# Patient Record
Sex: Female | Born: 1935 | Race: White | Hispanic: No | Marital: Single | Smoking: Never smoker
Health system: Southern US, Community
[De-identification: ages and names within clinical notes are randomized; demographics above are authoritative.]

## PROBLEM LIST (undated history)

## (undated) DIAGNOSIS — K219 Gastro-esophageal reflux disease without esophagitis: Secondary | ICD-10-CM

## (undated) DIAGNOSIS — M1990 Unspecified osteoarthritis, unspecified site: Secondary | ICD-10-CM

## (undated) DIAGNOSIS — I10 Essential (primary) hypertension: Secondary | ICD-10-CM

## (undated) HISTORY — PX: CHOLECYSTECTOMY: SHX55

## (undated) HISTORY — PX: BREAST SURGERY: SHX581

## (undated) HISTORY — PX: ABDOMINAL HYSTERECTOMY: SHX81

---

## 2001-12-25 ENCOUNTER — Ambulatory Visit (HOSPITAL_COMMUNITY): Admission: RE | Admit: 2001-12-25 | Discharge: 2001-12-25 | Payer: Self-pay | Admitting: Internal Medicine

## 2013-07-22 ENCOUNTER — Encounter (HOSPITAL_COMMUNITY): Payer: Self-pay | Admitting: Pharmacy Technician

## 2013-07-27 NOTE — Patient Instructions (Signed)
Your procedure is scheduled on: 08/03/2013  Report to [facility] at  96 AM.  Call this number if you have problems the morning of surgery: [phone]   Do not eat food or drink liquids :After Midnight.      Take these medicines the morning of surgery with A SIP OF WATER: lisinopril, protonix   Do not wear jewelry, make-up or nail polish.  Do not wear lotions, powders, or perfumes.   Do not shave 48 hours prior to surgery.  Do not bring valuables to the hospital.  Contacts, dentures or bridgework may not be worn into surgery.  Leave suitcase in the car. After surgery it may be brought to your room.  For patients admitted to the hospital, checkout time is 11:00 AM the day of discharge.   Patients discharged the day of surgery will not be allowed to drive home.  :     Please read over the following fact sheets that you were given: Coughing and Deep Breathing, Surgical Site Infection Prevention, Anesthesia Post-op Instructions and Care and Recovery After Surgery    Cataract A cataract is a clouding of the lens of the eye. When a lens becomes cloudy, vision is reduced based on the degree and nature of the clouding. Many cataracts reduce vision to some degree. Some cataracts make people more near-sighted as they develop. Other cataracts increase glare. Cataracts that are ignored and become worse can sometimes look white. The white color can be seen through the pupil. CAUSES   Aging. However, cataracts may occur at any age, even in newborns.   Certain drugs.   Trauma to the eye.   Certain diseases such as diabetes.   Specific eye diseases such as chronic inflammation inside the eye or a sudden attack of a rare form of glaucoma.   Inherited or acquired medical problems.  SYMPTOMS   Gradual, progressive drop in vision in the affected eye.   Severe, rapid visual loss. This most often happens when trauma is the cause.  DIAGNOSIS  To detect a cataract, an eye doctor examines the lens.  Cataracts are best diagnosed with an exam of the eyes with the pupils enlarged (dilated) by drops.  TREATMENT  For an early cataract, vision may improve by using different eyeglasses or stronger lighting. If that does not help your vision, surgery is the only effective treatment. A cataract needs to be surgically removed when vision loss interferes with your everyday activities, such as driving, reading, or watching TV. A cataract may also have to be removed if it prevents examination or treatment of another eye problem. Surgery removes the cloudy lens and usually replaces it with a substitute lens (intraocular lens, IOL).  At a time when both you and your doctor agree, the cataract will be surgically removed. If you have cataracts in both eyes, only one is usually removed at a time. This allows the operated eye to heal and be out of danger from any possible problems after surgery (such as infection or poor wound healing). In rare cases, a cataract may be doing damage to your eye. In these cases, your caregiver may advise surgical removal right away. The vast majority of people who have cataract surgery have better vision afterward. HOME CARE INSTRUCTIONS  If you are not planning surgery, you may be asked to do the following:  Use different eyeglasses.   Use stronger or brighter lighting.   Ask your eye doctor about reducing your medicine dose or changing medicines if  it is thought that a medicine caused your cataract. Changing medicines does not make the cataract go away on its own.   Become familiar with your surroundings. Poor vision can lead to injury. Avoid bumping into things on the affected side. You are at a higher risk for tripping or falling.   Exercise extreme care when driving or operating machinery.   Wear sunglasses if you are sensitive to bright light or experiencing problems with glare.  SEEK IMMEDIATE MEDICAL CARE IF:   You have a worsening or sudden vision loss.   You notice  redness, swelling, or increasing pain in the eye.   You have a fever.  Document Released: 03/26/2005 Document Revised: 03/15/2011 Document Reviewed: 11/17/2010 [facility] Patient Information 2012 [place].PATIENT INSTRUCTIONS POST-ANESTHESIA  IMMEDIATELY FOLLOWING SURGERY:  Do not drive or operate machinery for the first twenty four hours after surgery.  Do not make any important decisions for twenty four hours after surgery or while taking narcotic pain medications or sedatives.  If you develop intractable nausea and vomiting or a severe headache please notify your doctor immediately.  FOLLOW-UP:  Please make an appointment with your surgeon as instructed. You do not need to follow up with anesthesia unless specifically instructed to do so.  WOUND CARE INSTRUCTIONS (if applicable):  Keep a dry clean dressing on the anesthesia/puncture wound site if there is drainage.  Once the wound has quit draining you may leave it open to air.  Generally you should leave the bandage intact for twenty four hours unless there is drainage.  If the epidural site drains for more than 36-48 hours please call the anesthesia department.  QUESTIONS?:  Please feel free to call your physician or the hospital operator if you have any questions, and they will be happy to assist you.

## 2013-07-28 ENCOUNTER — Encounter (HOSPITAL_COMMUNITY)
Admission: RE | Admit: 2013-07-28 | Discharge: 2013-07-28 | Disposition: A | Discharge status: Home / Self Care | Payer: Medicare Other | Source: Ambulatory Visit | Attending: Ophthalmology | Admitting: Ophthalmology

## 2013-07-28 ENCOUNTER — Encounter (HOSPITAL_COMMUNITY): Payer: Self-pay

## 2013-07-28 ENCOUNTER — Other Ambulatory Visit: Payer: Self-pay

## 2013-07-28 DIAGNOSIS — Z01812 Encounter for preprocedural laboratory examination: Secondary | ICD-10-CM | POA: Insufficient documentation

## 2013-07-28 DIAGNOSIS — Z01810 Encounter for preprocedural cardiovascular examination: Secondary | ICD-10-CM | POA: Insufficient documentation

## 2013-07-28 HISTORY — DX: Essential (primary) hypertension: I10

## 2013-07-28 HISTORY — DX: Unspecified osteoarthritis, unspecified site: M19.90

## 2013-07-28 HISTORY — DX: Gastro-esophageal reflux disease without esophagitis: K21.9

## 2013-07-28 LAB — BASIC METABOLIC PANEL
BUN: 24 mg/dL — AB (ref 6–23)
CHLORIDE: 99 meq/L (ref 96–112)
CO2: 29 mEq/L (ref 19–32)
Calcium: 9.8 mg/dL (ref 8.4–10.5)
Creatinine, Ser: 0.93 mg/dL (ref 0.50–1.10)
GFR, EST AFRICAN AMERICAN: 65 mL/min — AB (ref >90)
GFR, EST NON AFRICAN AMERICAN: 56 mL/min — AB (ref >90)
Glucose, Bld: 105 mg/dL — ABNORMAL HIGH (ref 70–99)
POTASSIUM: 5.2 meq/L (ref 3.7–5.3)
SODIUM: 139 meq/L (ref 137–147)

## 2013-07-28 LAB — HEMOGLOBIN AND HEMATOCRIT, BLOOD
HCT: 42 % (ref 36.0–46.0)
HEMOGLOBIN: 14.3 g/dL (ref 12.0–15.0)

## 2013-07-28 NOTE — Pre-Procedure Instructions (Signed)
Patient given information to sign up for my chart at home. 

## 2013-07-31 MED ORDER — NEOMYCIN-POLYMYXIN-DEXAMETH 3.5-10000-0.1 OP SUSP
OPHTHALMIC | Status: AC
  Filled 2013-07-31: qty 5

## 2013-07-31 MED ORDER — PHENYLEPHRINE HCL 2.5 % OP SOLN
OPHTHALMIC | Status: AC
  Filled 2013-07-31: qty 15

## 2013-07-31 MED ORDER — LIDOCAINE HCL 3.5 % OP GEL
OPHTHALMIC | Status: AC
  Filled 2013-07-31: qty 1

## 2013-07-31 MED ORDER — TETRACAINE HCL 0.5 % OP SOLN
OPHTHALMIC | Status: AC
  Filled 2013-07-31: qty 2

## 2013-07-31 MED ORDER — CYCLOPENTOLATE-PHENYLEPHRINE OP SOLN OPTIME - NO CHARGE
OPHTHALMIC | Status: AC
  Filled 2013-07-31: qty 2

## 2013-07-31 MED ORDER — LIDOCAINE HCL (PF) 1 % IJ SOLN
INTRAMUSCULAR | Status: AC
  Filled 2013-07-31: qty 2

## 2013-08-03 ENCOUNTER — Ambulatory Visit (HOSPITAL_COMMUNITY)
Admission: RE | Admit: 2013-08-03 | Discharge: 2013-08-03 | Disposition: A | Discharge status: Home / Self Care | Payer: Medicare Other | Source: Ambulatory Visit | Attending: Ophthalmology | Admitting: Ophthalmology

## 2013-08-03 ENCOUNTER — Encounter (HOSPITAL_COMMUNITY)
Admission: RE | Disposition: A | Discharge status: Home / Self Care | Payer: Self-pay | Source: Ambulatory Visit | Attending: Ophthalmology

## 2013-08-03 ENCOUNTER — Encounter (HOSPITAL_COMMUNITY): Payer: Medicare Other | Admitting: Anesthesiology

## 2013-08-03 ENCOUNTER — Encounter (HOSPITAL_COMMUNITY): Payer: Self-pay | Admitting: *Deleted

## 2013-08-03 ENCOUNTER — Ambulatory Visit (HOSPITAL_COMMUNITY): Payer: Medicare Other | Admitting: Anesthesiology

## 2013-08-03 DIAGNOSIS — H2589 Other age-related cataract: Secondary | ICD-10-CM | POA: Insufficient documentation

## 2013-08-03 DIAGNOSIS — Z79899 Other long term (current) drug therapy: Secondary | ICD-10-CM | POA: Insufficient documentation

## 2013-08-03 DIAGNOSIS — I10 Essential (primary) hypertension: Secondary | ICD-10-CM | POA: Insufficient documentation

## 2013-08-03 DIAGNOSIS — K219 Gastro-esophageal reflux disease without esophagitis: Secondary | ICD-10-CM | POA: Insufficient documentation

## 2013-08-03 HISTORY — PX: CATARACT EXTRACTION W/PHACO: SHX586

## 2013-08-03 SURGERY — PHACOEMULSIFICATION, CATARACT, WITH IOL INSERTION
Anesthesia: Monitor Anesthesia Care | Site: Eye | Laterality: Right

## 2013-08-03 MED ORDER — MIDAZOLAM HCL 2 MG/2ML IJ SOLN
INTRAMUSCULAR | Status: AC
  Filled 2013-08-03: qty 2

## 2013-08-03 MED ORDER — PROVISC 10 MG/ML IO SOLN
INTRAOCULAR | Status: DC | PRN
  Administered 2013-08-03: 0.85 mL via INTRAOCULAR

## 2013-08-03 MED ORDER — LACTATED RINGERS IV SOLN
INTRAVENOUS | Status: DC
  Administered 2013-08-03: 07:00:00 via INTRAVENOUS

## 2013-08-03 MED ORDER — LIDOCAINE HCL (PF) 1 % IJ SOLN
INTRAMUSCULAR | Status: DC | PRN
  Administered 2013-08-03: .5 mL

## 2013-08-03 MED ORDER — EPINEPHRINE HCL 1 MG/ML IJ SOLN
INTRAOCULAR | Status: DC | PRN
  Administered 2013-08-03: 08:00:00

## 2013-08-03 MED ORDER — POVIDONE-IODINE 5 % OP SOLN
OPHTHALMIC | Status: DC | PRN
  Administered 2013-08-03: 1 via OPHTHALMIC

## 2013-08-03 MED ORDER — FENTANYL CITRATE 0.05 MG/ML IJ SOLN
25.0000 ug | INTRAMUSCULAR | Status: AC
  Administered 2013-08-03 (×2): 25 ug via INTRAVENOUS

## 2013-08-03 MED ORDER — PHENYLEPHRINE HCL 2.5 % OP SOLN
1.0000 [drp] | OPHTHALMIC | Status: AC
  Administered 2013-08-03 (×3): 1 [drp] via OPHTHALMIC

## 2013-08-03 MED ORDER — NEOMYCIN-POLYMYXIN-DEXAMETH 3.5-10000-0.1 OP SUSP
OPHTHALMIC | Status: DC | PRN
Start: 2013-08-03 — End: 2013-08-03
  Administered 2013-08-03: 2 [drp] via OPHTHALMIC

## 2013-08-03 MED ORDER — FENTANYL CITRATE 0.05 MG/ML IJ SOLN: 25.0000 ug | INTRAMUSCULAR | Status: DC | PRN

## 2013-08-03 MED ORDER — LIDOCAINE HCL 3.5 % OP GEL
1.0000 "application " | Freq: Once | OPHTHALMIC | Status: AC
  Administered 2013-08-03: 1 via OPHTHALMIC

## 2013-08-03 MED ORDER — LIDOCAINE 3.5 % OP GEL OPTIME - NO CHARGE
OPHTHALMIC | Status: DC | PRN
  Administered 2013-08-03: 2 [drp] via OPHTHALMIC

## 2013-08-03 MED ORDER — MIDAZOLAM HCL 2 MG/2ML IJ SOLN
1.0000 mg | INTRAMUSCULAR | Status: DC | PRN
  Administered 2013-08-03: 2 mg via INTRAVENOUS

## 2013-08-03 MED ORDER — FENTANYL CITRATE 0.05 MG/ML IJ SOLN
INTRAMUSCULAR | Status: AC
  Filled 2013-08-03: qty 2

## 2013-08-03 MED ORDER — CYCLOPENTOLATE-PHENYLEPHRINE 0.2-1 % OP SOLN
1.0000 [drp] | OPHTHALMIC | Status: AC
  Administered 2013-08-03 (×3): 1 [drp] via OPHTHALMIC

## 2013-08-03 MED ORDER — ONDANSETRON HCL 4 MG/2ML IJ SOLN: 4.0000 mg | Freq: Once | INTRAMUSCULAR | Status: DC | PRN

## 2013-08-03 MED ORDER — BSS IO SOLN
INTRAOCULAR | Status: DC | PRN
Start: 2013-08-03 — End: 2013-08-03
  Administered 2013-08-03: 15 mL via INTRAOCULAR

## 2013-08-03 MED ORDER — TETRACAINE HCL 0.5 % OP SOLN
1.0000 [drp] | OPHTHALMIC | Status: AC
  Administered 2013-08-03 (×3): 1 [drp] via OPHTHALMIC

## 2013-08-03 SURGICAL SUPPLY — 33 items
CAPSULAR TENSION RING-AMO (OPHTHALMIC RELATED) IMPLANT
CLOTH BEACON ORANGE TIMEOUT ST (SAFETY) ×1 IMPLANT
EYE SHIELD UNIVERSAL CLEAR (GAUZE/BANDAGES/DRESSINGS) ×1 IMPLANT
GLOVE BIO SURGEON STRL SZ 6.5 (GLOVE) IMPLANT
GLOVE BIOGEL PI IND STRL 6.5 (GLOVE) IMPLANT
GLOVE BIOGEL PI IND STRL 7.0 (GLOVE) IMPLANT
GLOVE BIOGEL PI IND STRL 7.5 (GLOVE) IMPLANT
GLOVE BIOGEL PI INDICATOR 6.5 (GLOVE) ×1
GLOVE BIOGEL PI INDICATOR 7.0 (GLOVE)
GLOVE BIOGEL PI INDICATOR 7.5 (GLOVE)
GLOVE ECLIPSE 6.5 STRL STRAW (GLOVE) IMPLANT
GLOVE ECLIPSE 7.0 STRL STRAW (GLOVE) IMPLANT
GLOVE ECLIPSE 7.5 STRL STRAW (GLOVE) IMPLANT
GLOVE EXAM NITRILE LRG STRL (GLOVE) IMPLANT
GLOVE EXAM NITRILE MD LF STRL (GLOVE) IMPLANT
GLOVE SKINSENSE NS SZ6.5 (GLOVE)
GLOVE SKINSENSE NS SZ7.0 (GLOVE)
GLOVE SKINSENSE STRL SZ6.5 (GLOVE) IMPLANT
GLOVE SKINSENSE STRL SZ7.0 (GLOVE) IMPLANT
GLOVE SS N UNI LF 7.0 STRL (GLOVE) ×1 IMPLANT
KIT VITRECTOMY (OPHTHALMIC RELATED) IMPLANT
PAD ARMBOARD 7.5X6 YLW CONV (MISCELLANEOUS) ×1 IMPLANT
PROC W NO LENS (INTRAOCULAR LENS)
PROC W SPEC LENS (INTRAOCULAR LENS)
PROCESS W NO LENS (INTRAOCULAR LENS) IMPLANT
PROCESS W SPEC LENS (INTRAOCULAR LENS) IMPLANT
RING MALYGIN (MISCELLANEOUS) IMPLANT
SIGHTPATH CAT PROC W REG LENS (Ophthalmic Related) ×2 IMPLANT
SYR TB 1ML LL NO SAFETY (SYRINGE) ×1 IMPLANT
TAPE SURG TRANSPORE 1 IN (GAUZE/BANDAGES/DRESSINGS) IMPLANT
TAPE SURGICAL TRANSPORE 1 IN (GAUZE/BANDAGES/DRESSINGS) ×1
VISCOELASTIC ADDITIONAL (OPHTHALMIC RELATED) IMPLANT
WATER STERILE IRR 250ML POUR (IV SOLUTION) ×1 IMPLANT

## 2013-08-03 NOTE — Transfer of Care (Signed)
Immediate Anesthesia Transfer of Care Note  Patient: Paula Mason  Procedure(s) Performed: Procedure(s) (LRB): CATARACT EXTRACTION PHACO AND INTRAOCULAR LENS PLACEMENT (IOC) (Right)  Patient Location: Shortstay  Anesthesia Type: MAC  Level of Consciousness: awake  Airway & Oxygen Therapy: Patient Spontanous Breathing   Post-op Assessment: Report given to PACU RN, Post -op Vital signs reviewed and stable and Patient moving all extremities  Post vital signs: Reviewed and stable  Complications: No apparent anesthesia complications

## 2013-08-03 NOTE — H&P (Signed)
I have reviewed the H&P, the patient was re-examined, and I have identified no interval changes in medical condition and plan of care since the history and physical of record  

## 2013-08-03 NOTE — Anesthesia Procedure Notes (Signed)
Procedure Name: MAC Date/Time: 08/03/2013 7:52 AM Performed by: Vista Deck Pre-anesthesia Checklist: Patient identified, Emergency Drugs available, Suction available, Timeout performed and Patient being monitored Patient Re-evaluated:Patient Re-evaluated prior to inductionOxygen Delivery Method: Nasal Cannula

## 2013-08-03 NOTE — Anesthesia Postprocedure Evaluation (Signed)
  Anesthesia Post-op Note  Patient: Paula Mason  Procedure(s) Performed: Procedure(s) (LRB): CATARACT EXTRACTION PHACO AND INTRAOCULAR LENS PLACEMENT (IOC) (Right)  Patient Location:  Short Stay  Anesthesia Type: MAC  Level of Consciousness: awake  Airway and Oxygen Therapy: Patient Spontanous Breathing  Post-op Pain: none  Post-op Assessment: Post-op Vital signs reviewed, Patient's Cardiovascular Status Stable, Respiratory Function Stable, Patent Airway, No signs of Nausea or vomiting and Pain level controlled  Post-op Vital Signs: Reviewed and stable  Complications: No apparent anesthesia complications

## 2013-08-03 NOTE — Anesthesia Preprocedure Evaluation (Addendum)
Anesthesia Evaluation  Patient identified by MRN, date of birth, ID band Patient awake    Reviewed: Allergy & Precautions, H&P , NPO status , Patient's Chart, lab work & pertinent test results  Airway Mallampati: II TM Distance: >3 FB     Dental  (+) Teeth Intact   Pulmonary neg pulmonary ROS,  breath sounds clear to auscultation        Cardiovascular hypertension, Pt. on medications Rhythm:Regular Rate:Normal     Neuro/Psych    GI/Hepatic GERD-  Medicated and Controlled,  Endo/Other    Renal/GU      Musculoskeletal   Abdominal   Peds  Hematology   Anesthesia Other Findings   Reproductive/Obstetrics                          Anesthesia Physical Anesthesia Plan  ASA: II  Anesthesia Plan: MAC   Post-op Pain Management:    Induction: Intravenous  Airway Management Planned: Nasal Cannula  Additional Equipment:   Intra-op Plan:   Post-operative Plan:   Informed Consent: I have reviewed the patients History and Physical, chart, labs and discussed the procedure including the risks, benefits and alternatives for the proposed anesthesia with the patient or authorized representative who has indicated his/her understanding and acceptance.     Plan Discussed with:   Anesthesia Plan Comments:         Anesthesia Quick Evaluation

## 2013-08-03 NOTE — Op Note (Signed)
Date of Admission: 08/03/2013  Date of Surgery: 08/03/2013   Pre-Op Dx: Cataract Right Eye  Post-Op Dx: Combined Cataract Right  Eye,  Dx Code 366.19  Surgeon: Tonny Branch, M.D.  Assistants: None  Anesthesia: Topical with MAC  Indications: Painless, progressive loss of vision with compromise of daily activities.  Surgery: Cataract Extraction with Intraocular lens Implant Right Eye  Discription: The patient had dilating drops and viscous lidocaine placed into the Right eye in the pre-op holding area. After transfer to the operating room, a time out was performed. The patient was then prepped and draped. Beginning with a 52 degree blade a paracentesis port was made at the surgeon's 2 o'clock position. The anterior chamber was then filled with 1% non-preserved lidocaine. This was followed by filling the anterior chamber with Provisc.  A 2.54mm keratome blade was used to make a clear corneal incision at the temporal limbus.  A bent cystatome needle was used to create a continuous tear capsulotomy. Hydrodissection was performed with balanced salt solution on a Fine canula. The lens nucleus was then removed using the phacoemulsification handpiece. Residual cortex was removed with the I&A handpiece. The anterior chamber and capsular bag were refilled with Provisc. A posterior chamber intraocular lens was placed into the capsular bag with it's injector. The implant was positioned with the Kuglan hook. The Provisc was then removed from the anterior chamber and capsular bag with the I&A handpiece. Stromal hydration of the main incision and paracentesis port was performed with BSS on a Fine canula. The wounds were tested for leak which was negative. The patient tolerated the procedure well. There were no operative complications. The patient was then transferred to the recovery room in stable condition.  Complications: None  Specimen: None  EBL: None  Prosthetic device: Hoya iSert 250, power 15.5 D, SN  [id number].

## 2013-08-03 NOTE — Discharge Instructions (Signed)

## 2013-08-04 ENCOUNTER — Encounter (HOSPITAL_COMMUNITY): Payer: Self-pay | Admitting: Ophthalmology

## 2015-01-18 ENCOUNTER — Ambulatory Visit (INDEPENDENT_AMBULATORY_CARE_PROVIDER_SITE_OTHER): Payer: Medicare Other | Admitting: Podiatry

## 2015-01-18 ENCOUNTER — Encounter: Payer: Self-pay | Admitting: Podiatry

## 2015-01-18 VITALS — BP 154/89 | HR 73 | Resp 16

## 2015-01-18 DIAGNOSIS — L603 Nail dystrophy: Secondary | ICD-10-CM | POA: Diagnosis not present

## 2015-01-18 NOTE — Addendum Note (Signed)
Addended by: Clovis Riley E on: 01/18/2015 01:47 PM   Modules accepted: Orders

## 2015-01-18 NOTE — Progress Notes (Signed)
   Subjective:    Patient ID: Paula Mason, female    DOB: [birth year], 81 y.o.   MRN: [id number]  HPI: She presents today with a chief complaint of thick yellow dystrophic nails hallux and lesser digits of the left foot. She also complains of peeling in her forefoot. She states that she's been to see many different doctors including podiatrist in dermatologists who have provided her with oral medications topical medications latest of which is clobetasol which seems to work the best on the plantar aspect of the foot. But nothing ever helped with the nails. She denies psoriasis or eczema and trauma. She states that she has this on no other nails including her hands or her other foot.   Review of Systems  HENT: Positive for hearing loss.   Hematological: Bruises/bleeds easily.  All other systems reviewed and are negative.      Objective:   Physical Exam: 81 year old white female in no apparent distress vital signs stable alert and oriented 3 pulses are strongly palpable. Neurologic sensorium is intact per Semmes-Weinstein monofilament. Deep tendon reflexes are intact bilateral and muscle strength +5 over 5 dorsiflexion plantar flexors and inverters everters all intrinsic musculature is intact. Orthopedic evaluation demonstrates all joints distal to the ankle L4 range of motion without crepitation. Cutaneous evaluation demonstrates supple well-hydrated cutis hallux second third fourth nails of the left foot are yellowed thickened friable and easily breakable. Fifth digit appears to be normal. Plantar aspect of the scan does demonstrate some mild peeling of the skin but no rashes.        Assessment & Plan:  Assessment: Nail dystrophy cannot rule out onychomycosis eczema psoriatic nail.  Plan: Samples were taken today of toenails #1 #2 #3 and #4 of the left foot. They were sent for pathologic evaluation and I will follow-up with this patient once we have our final result.  Roselind Messier DPM

## 2015-01-18 NOTE — Addendum Note (Signed)
Addended by: Harriett Sine D on: 01/18/2015 02:12 PM   Modules accepted: Orders

## 2015-01-19 ENCOUNTER — Telehealth: Payer: Self-pay | Admitting: *Deleted

## 2015-01-19 LAB — KOH PREP

## 2015-01-19 NOTE — Telephone Encounter (Addendum)
-----   Message from Garrel Ridgel, [place] sent at 01/19/2015  4:42 PM EDT ----- Fungus. Have her in.  Informed pt of results and will have scheduler contact her 01/20/2015.

## 2015-01-24 ENCOUNTER — Telehealth: Payer: Self-pay | Admitting: *Deleted

## 2015-01-24 NOTE — Telephone Encounter (Signed)
Dr. Milinda Pointer states pt needs an appt to discuss fungal culture results and test.  Left message informing pt to set up an appt.

## 2015-01-25 ENCOUNTER — Ambulatory Visit (INDEPENDENT_AMBULATORY_CARE_PROVIDER_SITE_OTHER): Payer: Medicare Other | Admitting: Podiatry

## 2015-01-25 ENCOUNTER — Encounter: Payer: Self-pay | Admitting: Podiatry

## 2015-01-25 VITALS — BP 107/61 | HR 93 | Resp 16

## 2015-01-25 DIAGNOSIS — Z79899 Other long term (current) drug therapy: Secondary | ICD-10-CM

## 2015-01-25 DIAGNOSIS — L603 Nail dystrophy: Secondary | ICD-10-CM | POA: Diagnosis not present

## 2015-01-25 LAB — CBC WITH DIFFERENTIAL/PLATELET
BASOS ABS: 0 10*3/uL (ref 0.0–0.1)
BASOS PCT: 0 % (ref 0–1)
EOS ABS: 0.1 10*3/uL (ref 0.0–0.7)
Eosinophils Relative: 1 % (ref 0–5)
HCT: 38.3 % (ref 36.0–46.0)
HEMOGLOBIN: 13.1 g/dL (ref 12.0–15.0)
Lymphocytes Relative: 34 % (ref 12–46)
Lymphs Abs: 2.8 10*3/uL (ref 0.7–4.0)
MCH: 32.3 pg (ref 26.0–34.0)
MCHC: 34.2 g/dL (ref 30.0–36.0)
MCV: 94.3 fL (ref 78.0–100.0)
MPV: 9.3 fL (ref 8.6–12.4)
Monocytes Absolute: 0.7 10*3/uL (ref 0.1–1.0)
Monocytes Relative: 9 % (ref 3–12)
NEUTROS ABS: 4.5 10*3/uL (ref 1.7–7.7)
NEUTROS PCT: 56 % (ref 43–77)
Platelets: 289 10*3/uL (ref 150–400)
RBC: 4.06 MIL/uL (ref 3.87–5.11)
RDW: 14.7 % (ref 11.5–15.5)
WBC: 8.1 10*3/uL (ref 4.0–10.5)

## 2015-01-25 LAB — HEPATIC FUNCTION PANEL
ALBUMIN: 4 g/dL (ref 3.6–5.1)
ALT: 27 U/L (ref 6–29)
AST: 29 U/L (ref 10–35)
Alkaline Phosphatase: 57 U/L (ref 33–130)
BILIRUBIN TOTAL: 0.4 mg/dL (ref 0.2–1.2)
Bilirubin, Direct: 0.1 mg/dL (ref <=0.2)
Indirect Bilirubin: 0.3 mg/dL (ref 0.2–1.2)
TOTAL PROTEIN: 6.8 g/dL (ref 6.1–8.1)

## 2015-01-25 MED ORDER — TERBINAFINE HCL 250 MG PO TABS: 250.0000 mg | ORAL_TABLET | Freq: Every day | ORAL | Status: DC

## 2015-01-25 NOTE — Progress Notes (Signed)
She presents today for her culture results from her toenails.  Objective: She presents today with a positive KOH from the lab.  Assessment: Onychomycosis.  Plan: Started her on Lamisil 250 mg tablets 1 by mouth daily. She brought blood work with her today demonstrates a normal liver profile. We will follow up with her in 1 month for another liver profile and 90 days with the medicine.  Roselind Messier DPM

## 2015-01-25 NOTE — Patient Instructions (Signed)

## 2015-01-28 ENCOUNTER — Telehealth: Payer: Self-pay

## 2015-01-28 NOTE — Telephone Encounter (Signed)
Attempted to call patient to inform her that her blood work was ok and to continue medication, but was disconnected on twice. Will try to make other attempts

## 2015-02-08 ENCOUNTER — Telehealth: Payer: Self-pay | Admitting: *Deleted

## 2015-02-08 DIAGNOSIS — Z79899 Other long term (current) drug therapy: Secondary | ICD-10-CM

## 2015-02-08 NOTE — Telephone Encounter (Signed)
Dr. Milinda Pointer states pt's CBC with Diff, and hepatic function test are within normal limits, continue medications as instructed.  Informed pt and she states she will have medication until 02/20/2015, then will be out-of-town 02/22/2015 when Dr. Milinda Pointer had wanted her to have blood work.  Pt states she hopes to be back before her scheduled appt 03/08/2015, but if not will she need to be off the Lamisil until tested and her appt.  Dr. Milinda Pointer states stay off the medication until the blood work is performed.  Ordered the repeat hepatic function and CBC with Diff.

## 2015-03-08 ENCOUNTER — Ambulatory Visit: Payer: Medicare Other | Admitting: Podiatry

## 2015-03-15 ENCOUNTER — Ambulatory Visit (INDEPENDENT_AMBULATORY_CARE_PROVIDER_SITE_OTHER): Payer: Medicare Other | Admitting: Podiatry

## 2015-03-15 ENCOUNTER — Encounter: Payer: Self-pay | Admitting: Podiatry

## 2015-03-15 VITALS — BP 143/80 | HR 78 | Resp 16

## 2015-03-15 DIAGNOSIS — L603 Nail dystrophy: Secondary | ICD-10-CM

## 2015-03-15 DIAGNOSIS — Z79899 Other long term (current) drug therapy: Secondary | ICD-10-CM | POA: Diagnosis not present

## 2015-03-15 LAB — HEPATIC FUNCTION PANEL
ALBUMIN: 4 g/dL (ref 3.6–5.1)
ALT: 16 U/L (ref 6–29)
AST: 22 U/L (ref 10–35)
Alkaline Phosphatase: 57 U/L (ref 33–130)
BILIRUBIN DIRECT: 0.1 mg/dL (ref <=0.2)
BILIRUBIN TOTAL: 0.4 mg/dL (ref 0.2–1.2)
Indirect Bilirubin: 0.3 mg/dL (ref 0.2–1.2)
Total Protein: 6.6 g/dL (ref 6.1–8.1)

## 2015-03-15 LAB — CBC WITH DIFFERENTIAL/PLATELET
BASOS ABS: 0 10*3/uL (ref 0.0–0.1)
Basophils Relative: 0 % (ref 0–1)
EOS ABS: 0.1 10*3/uL (ref 0.0–0.7)
EOS PCT: 2 % (ref 0–5)
HCT: 38.5 % (ref 36.0–46.0)
HEMOGLOBIN: 13.2 g/dL (ref 12.0–15.0)
LYMPHS ABS: 2.3 10*3/uL (ref 0.7–4.0)
Lymphocytes Relative: 34 % (ref 12–46)
MCH: 32.4 pg (ref 26.0–34.0)
MCHC: 34.3 g/dL (ref 30.0–36.0)
MCV: 94.4 fL (ref 78.0–100.0)
MPV: 9.2 fL (ref 8.6–12.4)
Monocytes Absolute: 0.7 10*3/uL (ref 0.1–1.0)
Monocytes Relative: 10 % (ref 3–12)
Neutro Abs: 3.7 10*3/uL (ref 1.7–7.7)
Neutrophils Relative %: 54 % (ref 43–77)
PLATELETS: 312 10*3/uL (ref 150–400)
RBC: 4.08 MIL/uL (ref 3.87–5.11)
RDW: 14.3 % (ref 11.5–15.5)
WBC: 6.9 10*3/uL (ref 4.0–10.5)

## 2015-03-15 MED ORDER — TERBINAFINE HCL 250 MG PO TABS: 250.0000 mg | ORAL_TABLET | Freq: Every day | ORAL | Status: DC

## 2015-03-15 NOTE — Progress Notes (Signed)
She presents today after her first 30 days of Lamisil. She states it seems like it is getting better.  Objective: No erythema edema cellulitis drainage or odor her nail plates appear to be unchanged from previous visits.  Assessment: Onychomycosis.  Plan: Long-term therapy with Lamisil. Started her on 90 day dose of Lamisil and another liver profile was performed. Follow-up with her in 4 months

## 2015-03-17 ENCOUNTER — Telehealth: Payer: Self-pay | Admitting: *Deleted

## 2015-03-17 NOTE — Telephone Encounter (Addendum)
-----   Message from Garrel Ridgel, [place] sent at 03/16/2015  4:58 PM EST ----- Blood work looks good and may continue medication.  Informed pt of orders.

## 2015-04-12 ENCOUNTER — Ambulatory Visit: Payer: Medicare Other | Admitting: Podiatry

## 2015-05-03 ENCOUNTER — Telehealth: Payer: Self-pay | Admitting: *Deleted

## 2015-05-03 NOTE — Telephone Encounter (Signed)
Pt states she is on the last of the 3rd bottle of Terbinafine and was wondering if she needed another.  I told pt when she has taken 90 doses of the Terbinafine she will have completed the therapeutic dosing for this medication.  Pt states she is finishing the 3rd bottle of 30 now.  I reminded pt it will take 6-9 months to see new nail out growth, and pt states she is seeing improvement now.

## 2015-07-14 ENCOUNTER — Encounter: Payer: Self-pay | Admitting: Podiatry

## 2015-07-14 ENCOUNTER — Ambulatory Visit (INDEPENDENT_AMBULATORY_CARE_PROVIDER_SITE_OTHER): Payer: Medicare Other | Admitting: Podiatry

## 2015-07-14 DIAGNOSIS — L603 Nail dystrophy: Secondary | ICD-10-CM

## 2015-07-14 DIAGNOSIS — Z79899 Other long term (current) drug therapy: Secondary | ICD-10-CM | POA: Diagnosis not present

## 2015-07-14 MED ORDER — TERBINAFINE HCL 250 MG PO TABS: 250.0000 mg | ORAL_TABLET | Freq: Every day | ORAL | Status: DC

## 2015-07-14 NOTE — Progress Notes (Signed)
She presents today after having completed 120 days of Lamisil therapy. She states that all of the toes look much better with the exception of the left hallux nail plate. She states that it look some better but it is just not going very fast. She denies fever chills nausea vomiting muscle aches pains itching or rashes with the medication.  Objective: Vital signs are stable she is alert and oriented 3. All of her nail plates are 624THL cleared with exception of the hallux left which does demonstrate a nail dystrophy with distal onychomycosis. I reviewed her lab findings with her which did demonstrate nail dystrophy as well as onychomycosis.  Assessment: Nail dystrophy with a slowly growing hallux nail left. 100% resolution of onychomycosis to the lesser nail plates.  Plan: At this point I will start her on Lamisil 250 mg 1 by mouth every other day for 60 days. I will follow-up with her in 3 months. She will call with questions or concerns.

## 2015-11-03 ENCOUNTER — Encounter: Payer: Self-pay | Admitting: Podiatry

## 2015-11-03 ENCOUNTER — Ambulatory Visit (INDEPENDENT_AMBULATORY_CARE_PROVIDER_SITE_OTHER): Payer: Medicare Other | Admitting: Podiatry

## 2015-11-03 DIAGNOSIS — L603 Nail dystrophy: Secondary | ICD-10-CM | POA: Diagnosis not present

## 2015-11-03 MED ORDER — TERBINAFINE HCL 250 MG PO TABS: 250.0000 mg | ORAL_TABLET | Freq: Every day | ORAL | 0 refills | Status: DC

## 2015-11-03 NOTE — Progress Notes (Signed)
She presents today for another follow-up visit regarding her onychomycosis. She has just finished her every other day dose for the last 60 days and states that her toenails appear that they are doing much better.  Objective: Vital signs are stable alert and oriented 3 pulses are palpable. Neurologic sensorium is intact. Her toenails appear to be clearing with exception of the hallux left which does demonstrate a nail dystrophy.  Assessment: Onychomycosis slowly clearing with use of Lamisil.  Plan: Continue another 60 days of Lamisil one every other day. Follow up with her in 3-4 months.

## 2016-02-07 ENCOUNTER — Encounter: Payer: Self-pay | Admitting: Podiatry

## 2016-02-07 ENCOUNTER — Ambulatory Visit (INDEPENDENT_AMBULATORY_CARE_PROVIDER_SITE_OTHER): Payer: Medicare Other | Admitting: Podiatry

## 2016-02-07 DIAGNOSIS — Z79899 Other long term (current) drug therapy: Secondary | ICD-10-CM

## 2016-02-07 DIAGNOSIS — L603 Nail dystrophy: Secondary | ICD-10-CM

## 2016-02-08 NOTE — Progress Notes (Signed)
She presents today for follow-up of her onychomycosis after she finished up her Lamisil. She states that they're looking so much better she is very excited.  Vital signs are stable she is alert and oriented 3 had no problems taking the medication. Her toenail plates were not to about 90%. At this point she has some residual nail left and appears to be fungal however this should go on to grow out.  Assessment: Well-healing onychomycosis long-term therapy with Lamisil.  Plan: I encouraged her to make sure that this continues to grow out and debrided the end of the nail frequently to prevent regression should this regress she will notify us immediately.

## 2016-02-14 DIAGNOSIS — Z1211 Encounter for screening for malignant neoplasm of colon: Secondary | ICD-10-CM | POA: Diagnosis not present

## 2016-02-14 DIAGNOSIS — Z0000 Encounter for general adult medical examination without abnormal findings: Secondary | ICD-10-CM | POA: Diagnosis not present

## 2016-02-15 DIAGNOSIS — E559 Vitamin D deficiency, unspecified: Secondary | ICD-10-CM | POA: Diagnosis not present

## 2017-02-20 DIAGNOSIS — E559 Vitamin D deficiency, unspecified: Secondary | ICD-10-CM | POA: Diagnosis not present

## 2017-02-20 DIAGNOSIS — E7800 Pure hypercholesterolemia, unspecified: Secondary | ICD-10-CM | POA: Diagnosis not present

## 2018-02-20 DIAGNOSIS — Z1211 Encounter for screening for malignant neoplasm of colon: Secondary | ICD-10-CM | POA: Diagnosis not present

## 2018-02-20 DIAGNOSIS — E7800 Pure hypercholesterolemia, unspecified: Secondary | ICD-10-CM | POA: Diagnosis not present

## 2018-02-20 DIAGNOSIS — D649 Anemia, unspecified: Secondary | ICD-10-CM | POA: Diagnosis not present

## 2018-02-20 DIAGNOSIS — Z0000 Encounter for general adult medical examination without abnormal findings: Secondary | ICD-10-CM | POA: Diagnosis not present

## 2019-02-24 DIAGNOSIS — Z1211 Encounter for screening for malignant neoplasm of colon: Secondary | ICD-10-CM | POA: Diagnosis not present

## 2019-02-24 DIAGNOSIS — Z0000 Encounter for general adult medical examination without abnormal findings: Secondary | ICD-10-CM | POA: Diagnosis not present

## 2019-02-25 DIAGNOSIS — E559 Vitamin D deficiency, unspecified: Secondary | ICD-10-CM | POA: Diagnosis not present

## 2019-02-25 DIAGNOSIS — E7800 Pure hypercholesterolemia, unspecified: Secondary | ICD-10-CM | POA: Diagnosis not present

## 2019-06-25 DIAGNOSIS — Z299 Encounter for prophylactic measures, unspecified: Secondary | ICD-10-CM | POA: Diagnosis not present

## 2019-08-18 DIAGNOSIS — R1084 Generalized abdominal pain: Secondary | ICD-10-CM | POA: Diagnosis not present

## 2019-08-28 DIAGNOSIS — K265 Chronic or unspecified duodenal ulcer with perforation: Secondary | ICD-10-CM | POA: Diagnosis not present

## 2019-08-28 DIAGNOSIS — I10 Essential (primary) hypertension: Secondary | ICD-10-CM | POA: Diagnosis not present

## 2019-09-25 DIAGNOSIS — E538 Deficiency of other specified B group vitamins: Secondary | ICD-10-CM | POA: Diagnosis not present

## 2019-09-25 DIAGNOSIS — D509 Iron deficiency anemia, unspecified: Secondary | ICD-10-CM | POA: Diagnosis not present

## 2020-02-18 DIAGNOSIS — Z299 Encounter for prophylactic measures, unspecified: Secondary | ICD-10-CM | POA: Diagnosis not present

## 2020-02-18 DIAGNOSIS — Z7189 Other specified counseling: Secondary | ICD-10-CM | POA: Diagnosis not present

## 2020-03-31 ENCOUNTER — Inpatient Hospital Stay
Admit: 2020-03-31 | Discharge: 2020-03-31 | Disposition: A | Discharge status: Home / Self Care | Payer: MEDICARE | Attending: Emergency Medicine

## 2020-03-31 ENCOUNTER — Emergency Department: Admit: 2020-03-31 | Payer: MEDICARE | Primary: Student in an Organized Health Care Education/Training Program

## 2020-03-31 DIAGNOSIS — R55 Syncope and collapse: Secondary | ICD-10-CM

## 2020-03-31 DIAGNOSIS — I10 Essential (primary) hypertension: Secondary | ICD-10-CM | POA: Diagnosis not present

## 2020-03-31 LAB — CBC WITH AUTOMATED DIFF
ABS. BASOPHILS: 0.1 10*3/uL (ref 0.0–0.1)
ABS. EOSINOPHILS: 0.1 10*3/uL (ref 0.0–0.4)
ABS. IMM. GRANS.: 0 10*3/uL (ref 0.00–0.04)
ABS. LYMPHOCYTES: 1.6 10*3/uL (ref 0.8–3.5)
ABS. MONOCYTES: 0.6 10*3/uL (ref 0.0–1.0)
ABS. NEUTROPHILS: 3.7 10*3/uL (ref 1.8–8.0)
ABSOLUTE NRBC: 0 10*3/uL (ref 0.00–0.01)
BASOPHILS: 1 % (ref 0–1)
EOSINOPHILS: 1 % (ref 0–7)
HCT: 39.4 % (ref 35.0–47.0)
HGB: 13.1 g/dL (ref 11.5–16.0)
IMMATURE GRANULOCYTES: 1 % — ABNORMAL HIGH (ref 0.0–0.5)
LYMPHOCYTES: 26 % (ref 12–49)
MCH: 33 PG (ref 26.0–34.0)
MCHC: 33.2 g/dL (ref 30.0–36.5)
MCV: 99.2 FL — ABNORMAL HIGH (ref 80.0–99.0)
MONOCYTES: 10 % (ref 5–13)
MPV: 9.6 FL (ref 8.9–12.9)
NEUTROPHILS: 61 % (ref 32–75)
NRBC: 0 PER 100 WBC
PLATELET: 305 10*3/uL (ref 150–400)
RBC: 3.97 M/uL (ref 3.80–5.20)
RDW: 15.9 % — ABNORMAL HIGH (ref 11.5–14.5)
WBC: 6.1 10*3/uL (ref 3.6–11.0)

## 2020-03-31 LAB — URINALYSIS W/MICROSCOPIC
Bacteria: NEGATIVE /hpf
Bilirubin: NEGATIVE
Blood: NEGATIVE
Glucose: NEGATIVE mg/dL
Ketone: NEGATIVE mg/dL
Nitrites: NEGATIVE
Protein: NEGATIVE mg/dL
Specific gravity: 1.02 (ref 1.003–1.030)
Urobilinogen: 0.2 EU/dL (ref 0.2–1.0)
pH (UA): 6 (ref 5.0–8.0)

## 2020-03-31 LAB — METABOLIC PANEL, COMPREHENSIVE
A-G Ratio: 0.8 — ABNORMAL LOW (ref 1.1–2.2)
ALT (SGPT): 36 U/L (ref 12–78)
AST (SGOT): 24 U/L (ref 15–37)
Albumin: 3.3 g/dL — ABNORMAL LOW (ref 3.5–5.0)
Alk. phosphatase: 62 U/L (ref 45–117)
Anion gap: 10 mmol/L (ref 5–15)
BUN/Creatinine ratio: 23 — ABNORMAL HIGH (ref 12–20)
BUN: 38 MG/DL — ABNORMAL HIGH (ref 6–20)
Bilirubin, total: 0.5 MG/DL (ref 0.2–1.0)
CO2: 26 mmol/L (ref 21–32)
Calcium: 8.9 MG/DL (ref 8.5–10.1)
Chloride: 102 mmol/L (ref 97–108)
Creatinine: 1.63 MG/DL — ABNORMAL HIGH (ref 0.55–1.02)
GFR est AA: 36 mL/min/{1.73_m2} — ABNORMAL LOW (ref >60)
GFR est non-AA: 30 mL/min/{1.73_m2} — ABNORMAL LOW (ref >60)
Globulin: 4 g/dL (ref 2.0–4.0)
Glucose: 117 mg/dL — ABNORMAL HIGH (ref 65–100)
Potassium: 4.2 mmol/L (ref 3.5–5.1)
Protein, total: 7.3 g/dL (ref 6.4–8.2)
Sodium: 138 mmol/L (ref 136–145)

## 2020-03-31 LAB — SAMPLES BEING HELD

## 2020-03-31 LAB — TROPONIN-HIGH SENSITIVITY: Troponin-High Sensitivity: 8 ng/L (ref 0–51)

## 2020-03-31 LAB — CBC WITH AUTO DIFFERENTIAL
Basophils %: 1 % (ref 0–1)
Basophils Absolute: 0.1 10*3/uL (ref 0.0–0.1)
Eosinophils %: 1 % (ref 0–7)
Eosinophils Absolute: 0.1 10*3/uL (ref 0.0–0.4)
Granulocyte Absolute Count: 0 10*3/uL (ref 0.00–0.04)
Hematocrit: 39.4 % (ref 35.0–47.0)
Hemoglobin: 13.1 g/dL (ref 11.5–16.0)
Immature Granulocytes: 1 % — ABNORMAL HIGH (ref 0.0–0.5)
Lymphocytes %: 26 % (ref 12–49)
Lymphocytes Absolute: 1.6 10*3/uL (ref 0.8–3.5)
MCH: 33 PG (ref 26.0–34.0)
MCHC: 33.2 g/dL (ref 30.0–36.5)
MCV: 99.2 FL — ABNORMAL HIGH (ref 80.0–99.0)
MPV: 9.6 FL (ref 8.9–12.9)
Monocytes %: 10 % (ref 5–13)
Monocytes Absolute: 0.6 10*3/uL (ref 0.0–1.0)
NRBC Absolute: 0 10*3/uL (ref 0.00–0.01)
Neutrophils %: 61 % (ref 32–75)
Neutrophils Absolute: 3.7 10*3/uL (ref 1.8–8.0)
Nucleated RBCs: 0 PER 100 WBC
Platelets: 305 10*3/uL (ref 150–400)
RBC: 3.97 M/uL (ref 3.80–5.20)
RDW: 15.9 % — ABNORMAL HIGH (ref 11.5–14.5)
WBC: 6.1 10*3/uL (ref 3.6–11.0)

## 2020-03-31 LAB — URINALYSIS WITH MICROSCOPIC
BACTERIA, URINE: NEGATIVE /hpf
Bilirubin, Urine: NEGATIVE
Blood, Urine: NEGATIVE
Glucose, Ur: NEGATIVE mg/dL
Ketones, Urine: NEGATIVE mg/dL
Nitrite, Urine: NEGATIVE
Protein, UA: NEGATIVE mg/dL
Specific Gravity, UA: 1.02 (ref 1.003–1.030)
Urobilinogen, UA, POCT: 0.2 EU/dL (ref 0.2–1.0)
pH, UA: 6 (ref 5.0–8.0)

## 2020-03-31 LAB — COMPREHENSIVE METABOLIC PANEL
ALT: 36 U/L (ref 12–78)
AST: 24 U/L (ref 15–37)
Albumin/Globulin Ratio: 0.8 — ABNORMAL LOW (ref 1.1–2.2)
Albumin: 3.3 g/dL — ABNORMAL LOW (ref 3.5–5.0)
Alkaline Phosphatase: 62 U/L (ref 45–117)
Anion Gap: 10 mmol/L (ref 5–15)
BUN: 38 MG/DL — ABNORMAL HIGH (ref 6–20)
Bun/Cre Ratio: 23 — ABNORMAL HIGH (ref 12–20)
CO2: 26 mmol/L (ref 21–32)
Calcium: 8.9 MG/DL (ref 8.5–10.1)
Chloride: 102 mmol/L (ref 97–108)
Creatinine: 1.63 MG/DL — ABNORMAL HIGH (ref 0.55–1.02)
EGFR IF NonAfrican American: 30 mL/min/{1.73_m2} — ABNORMAL LOW (ref >60)
GFR African American: 36 mL/min/{1.73_m2} — ABNORMAL LOW (ref >60)
Globulin: 4 g/dL (ref 2.0–4.0)
Glucose: 117 mg/dL — ABNORMAL HIGH (ref 65–100)
Potassium: 4.2 mmol/L (ref 3.5–5.1)
Sodium: 138 mmol/L (ref 136–145)
Total Bilirubin: 0.5 MG/DL (ref 0.2–1.0)
Total Protein: 7.3 g/dL (ref 6.4–8.2)

## 2020-03-31 LAB — TROPONIN, HIGH SENSITIVITY: Troponin, High Sensitivity: 8 ng/L (ref 0–51)

## 2020-03-31 MED ORDER — ENOXAPARIN 30 MG/0.3 ML SUB-Q SYRINGE
30 mg/0.3 mL | Freq: Every day | SUBCUTANEOUS | Status: DC
Start: 2020-03-31 — End: 2020-04-01

## 2020-03-31 MED ORDER — METOPROLOL TARTRATE 25 MG TAB
25 mg | Freq: Two times a day (BID) | ORAL | Status: DC
Start: 2020-03-31 — End: 2020-04-01
  Administered 2020-03-31: 23:00:00 via ORAL

## 2020-03-31 MED ORDER — AMIODARONE 200 MG TAB
200 mg | Freq: Every day | ORAL | Status: DC
Start: 2020-03-31 — End: 2020-04-01
  Administered 2020-04-01: 14:00:00 via ORAL

## 2020-03-31 MED ORDER — ACETAMINOPHEN 325 MG TABLET
325 mg | Freq: Four times a day (QID) | ORAL | Status: DC | PRN
Start: 2020-03-31 — End: 2020-04-01

## 2020-03-31 MED ORDER — ACETAMINOPHEN 650 MG RECTAL SUPPOSITORY
650 mg | Freq: Four times a day (QID) | RECTAL | Status: DC | PRN
Start: 2020-03-31 — End: 2020-04-01

## 2020-03-31 MED ORDER — ONDANSETRON 4 MG TAB, RAPID DISSOLVE
4 mg | Freq: Three times a day (TID) | ORAL | Status: DC | PRN
Start: 2020-03-31 — End: 2020-04-01

## 2020-03-31 MED ORDER — ONDANSETRON (PF) 4 MG/2 ML INJECTION
4 mg/2 mL | Freq: Four times a day (QID) | INTRAMUSCULAR | Status: DC | PRN
Start: 2020-03-31 — End: 2020-04-01

## 2020-03-31 MED ORDER — POLYETHYLENE GLYCOL 3350 17 GRAM (100 %) ORAL POWDER PACKET
17 gram | Freq: Every day | ORAL | Status: DC | PRN
Start: 2020-03-31 — End: 2020-04-01

## 2020-03-31 MED ORDER — SODIUM CHLORIDE 0.9 % IJ SYRG
Freq: Three times a day (TID) | INTRAMUSCULAR | Status: DC
Start: 2020-03-31 — End: 2020-04-01
  Administered 2020-03-31 – 2020-04-01 (×3): via INTRAVENOUS

## 2020-03-31 MED ORDER — SODIUM CHLORIDE 0.9 % IJ SYRG
INTRAMUSCULAR | Status: DC | PRN
Start: 2020-03-31 — End: 2020-04-01

## 2020-03-31 MED ORDER — SODIUM CHLORIDE 0.9% BOLUS IV
0.9 % | Freq: Once | INTRAVENOUS | Status: AC
Start: 2020-03-31 — End: 2020-03-31
  Administered 2020-03-31: 20:00:00 via INTRAVENOUS

## 2020-03-31 MED ORDER — PANTOPRAZOLE 40 MG TAB, DELAYED RELEASE
40 mg | Freq: Every day | ORAL | Status: DC
Start: 2020-03-31 — End: 2020-04-01
  Administered 2020-04-01: 12:00:00 via ORAL

## 2020-03-31 MED FILL — SODIUM CHLORIDE 0.9 % IV: INTRAVENOUS | Qty: 500

## 2020-03-31 MED FILL — NORMAL SALINE FLUSH 0.9 % INJECTION SYRINGE: INTRAMUSCULAR | Qty: 40

## 2020-03-31 MED FILL — METOPROLOL TARTRATE 25 MG TAB: 25 mg | ORAL | Qty: 1

## 2020-03-31 NOTE — ED Provider Notes (Signed)
ED Provider Notes by Allen Derry, MD at 03/31/20 1207                Author: Allen Derry, MD  Service: Emergency Medicine  Author Type: Physician       Filed: 03/31/20 1826  Date of Service: 03/31/20 1207  Status: Addendum          Editor: Allen Derry, MD (Physician)          Related Notes: Original Note by Allen Derry, MD (Physician) filed at 03/31/20 1825               EMERGENCY DEPARTMENT HISTORY AND PHYSICAL EXAM           Date: 03/31/2020   Patient Name: Carol Robbins        History of Presenting Illness          Chief Complaint       Patient presents with        ?  Syncope           History Provided By: Patient and EMS      HPI: Carol Robbins,  86 y.o. female with PMHx significant for A. fib, presents via EMS to the ED with cc of syncope.   Son reports patient had a syncopal episode shortly after getting out of the shower this morning.  Around 11 AM.  He reports that after getting out of the shower there helping to get her dressed she felt tired and sat down on the couch.  His wife did's  stepped away briefly and when she returned she was slumped over on the couch.  Son quickly came over and noticed that she was breathing and had a pulse but was not responding verbally.    However by 1115 she was alert oriented x4 and back to baseline.   On EMS arrival they report she is alert and oriented x4.  She denies any chest pain or shortness of breath.  Per EMS she was not orthostatic.  Denies any extremity weakness numbness or tingling.  She does not have any memory of the event.  She is currently  without complaints and states she feels fine.            PMHx: Significant for A. fib.   PSHx: No pertinent surgical hx.   Social Hx: Smoker.      There are no other complaints, changes, or physical findings at this time.      PCP: Alma Downs, Not On File, MD        No current facility-administered medications on file prior to encounter.          No current outpatient medications on file prior to encounter.              Past History        Past Medical History:     Past Medical History:        Diagnosis  Date         ?  A-fib ([place])           ?  Cataract             Past Surgical History:   History reviewed. No pertinent surgical history.      Family History:   History reviewed. No pertinent family history.      Social History:     Social History          Tobacco Use         ?  Smoking status:  Never Smoker     ?  Smokeless tobacco:  Never Used       Substance Use Topics         ?  Alcohol use:  Never         ?  Drug use:  Never           Allergies:   No Known Allergies           Review of Systems     Review of Systems    Constitutional: Negative for activity change, chills and fever.    HENT: Negative for congestion and sore throat.     Eyes: Negative for pain and redness.    Respiratory: Negative for cough, chest tightness and shortness of breath.     Cardiovascular: Negative for chest pain and palpitations.    Gastrointestinal: Negative for abdominal pain, diarrhea, nausea and vomiting.    Genitourinary: Negative for dysuria, frequency and urgency.    Musculoskeletal: Negative for back pain and neck pain.    Skin: Negative for rash.    Neurological: Positive for syncope. Negative for light-headedness and headaches.    Psychiatric/Behavioral: Negative for confusion.    All other systems reviewed and are negative.           Physical Exam     Physical Exam   Vitals and nursing note reviewed.   Constitutional:        General: She is not in acute distress.     Appearance: She is well-developed. She is not diaphoretic.    HENT:       Head: Normocephalic.      Nose: Nose normal.      Mouth/Throat:      Pharynx: No oropharyngeal exudate.    Eyes:       General: No scleral icterus.     Extraocular Movements: Extraocular movements intact.      Conjunctiva/sclera: Conjunctivae normal.      Pupils: Pupils are equal, round, and reactive to light.   Neck:       Thyroid: No thyromegaly.      Vascular: No JVD.      Trachea: No  tracheal deviation.    Cardiovascular:       Rate and Rhythm: Normal rate and regular rhythm.      Heart sounds: No murmur heard.   No friction rub. No gallop.     Pulmonary:       Effort: Pulmonary effort is normal. No respiratory distress.      Breath sounds: Normal breath sounds. No stridor. No wheezing or  rales.    Abdominal:      General: Bowel sounds are normal. There is no distension.      Palpations: Abdomen is soft.      Tenderness: There is no abdominal tenderness. There is no guarding or  rebound.     Musculoskeletal:          General: Normal range of motion.      Cervical back: Normal range of motion and neck supple.     Lymphadenopathy:       Cervical: No cervical adenopathy.   Skin :      General: Skin is warm and dry.      Findings: No erythema or rash.    Neurological:       Mental Status: She is alert and oriented to person, place, and time.      Cranial Nerves: No cranial nerve deficit.  Motor: No abnormal muscle tone.      Coordination: Coordination normal.   Psychiatric:         Behavior: Behavior normal.                     Diagnostic Study Results        Labs -         Recent Results (from the past 12 hour(s))     EKG, 12 LEAD, INITIAL          Collection Time: 03/31/20 12:03 PM         Result  Value  Ref Range            Ventricular Rate  53  BPM       Atrial Rate  53  BPM       P-R Interval  226  ms       QRS Duration  106  ms       Q-T Interval  490  ms       QTC Calculation (Bezet)  459  ms       Calculated P Axis  43  degrees       Calculated R Axis  59  degrees       Calculated T Axis  3  degrees       Diagnosis                 Sinus bradycardia with 1st degree AV block   Incomplete right bundle branch block   Borderline ECG   No previous ECGs available          CBC WITH AUTOMATED DIFF          Collection Time: 03/31/20 12:07 PM         Result  Value  Ref Range            WBC  6.1  3.6 - 11.0 K/uL       RBC  3.97  3.80 - 5.20 M/uL       HGB  13.1  11.5 - 16.0 g/dL       HCT  39.4   35.0 - 47.0 %       MCV  99.2 (H)  80.0 - 99.0 FL       MCH  33.0  26.0 - 34.0 PG       MCHC  33.2  30.0 - 36.5 g/dL       RDW  15.9 (H)  11.5 - 14.5 %       PLATELET  305  150 - 400 K/uL       MPV  9.6  8.9 - 12.9 FL       NRBC  0.0  0 PER 100 WBC       ABSOLUTE NRBC  0.00  0.00 - 0.01 K/uL       NEUTROPHILS  61  32 - 75 %       LYMPHOCYTES  26  12 - 49 %       MONOCYTES  10  5 - 13 %       EOSINOPHILS  1  0 - 7 %       BASOPHILS  1  0 - 1 %       IMMATURE GRANULOCYTES  1 (H)  0.0 - 0.5 %       ABS. NEUTROPHILS  3.7  1.8 - 8.0 K/UL       ABS.  LYMPHOCYTES  1.6  0.8 - 3.5 K/UL       ABS. MONOCYTES  0.6  0.0 - 1.0 K/UL       ABS. EOSINOPHILS  0.1  0.0 - 0.4 K/UL       ABS. BASOPHILS  0.1  0.0 - 0.1 K/UL       ABS. IMM. GRANS.  0.0  0.00 - 0.04 K/UL       DF  AUTOMATED          METABOLIC PANEL, COMPREHENSIVE          Collection Time: 03/31/20 12:07 PM         Result  Value  Ref Range            Sodium  138  136 - 145 mmol/L       Potassium  4.2  3.5 - 5.1 mmol/L       Chloride  102  97 - 108 mmol/L       CO2  26  21 - 32 mmol/L       Anion gap  10  5 - 15 mmol/L       Glucose  117 (H)  65 - 100 mg/dL       BUN  38 (H)  6 - 20 MG/DL       Creatinine  1.63 (H)  0.55 - 1.02 MG/DL       BUN/Creatinine ratio  23 (H)  12 - 20         GFR est AA  36 (L)  >60 ml/min/1.110m       GFR est non-AA  30 (L)  >60 ml/min/1.756m      Calcium  8.9  8.5 - 10.1 MG/DL       Bilirubin, total  0.5  0.2 - 1.0 MG/DL       ALT (SGPT)  36  12 - 78 U/L       AST (SGOT)  24  15 - 37 U/L       Alk. phosphatase  62  45 - 117 U/L       Protein, total  7.3  6.4 - 8.2 g/dL       Albumin  3.3 (L)  3.5 - 5.0 g/dL       Globulin  4.0  2.0 - 4.0 g/dL       A-G Ratio  0.8 (L)  1.1 - 2.2         TROPONIN-HIGH SENSITIVITY          Collection Time: 03/31/20 12:07 PM         Result  Value  Ref Range            Troponin-High Sensitivity  8  0 - 51 ng/L       SAMPLES BEING HELD          Collection Time: 03/31/20 12:07 PM         Result  Value  Ref Range             SAMPLES BEING HELD  1SST,1BLUE         COMMENT                  Add-on orders for these samples will be processed based on acceptable specimen integrity and analyte stability, which may vary by analyte.           Radiologic Studies -      CT HEAD WO CONT  Final Result     No acute intracranial abnormalities.                      XR CHEST SNGL V       Final Result     No evidence of active intrathoracic disease.                 CT Results   (Last 48 hours)                                    03/31/20 1223    CT HEAD WO CONT  Final result            Impression:    No acute intracranial abnormalities.                                     Narrative:    EXAM: CT HEAD WO CONT             INDICATION: syncope,fall             COMPARISON: None.             CONTRAST: None.             TECHNIQUE: Unenhanced CT of the head was performed using 5 mm images. Brain and      bone windows were generated. Coronal and sagittal reformats. CT dose reduction      was achieved through use of a standardized protocol tailored for this      examination and automatic exposure control for dose modulation.               FINDINGS:      The ventricles and sulci are normal in size, shape and configuration.. There is      no significant white matter disease. There is no intracranial hemorrhage,      extra-axial collection, or mass effect. The basilar cisterns are open. No CT      evidence of acute infarct.             The bone windows demonstrate no abnormalities. The visualized portions of the      paranasal sinuses and mastoid air cells are clear.                                 CXR Results   (Last 48 hours)                                    03/31/20 1217    XR CHEST SNGL V  Final result            Impression:    No evidence of active intrathoracic disease.                       Narrative:    Chest single view dated 03/31/2020             History is chest pain             A single frontal view of the chest was obtained. The cardiac  silhouette is      grossly normal in size and  there is no evidence of active lung disease. A skin      fold projects over the lateral aspect of the left hemithorax. The costophrenic      angles are sharp in appearance.                                       Medical Decision Making     I am the first provider for this patient.      I reviewed the vital signs, available nursing notes, past medical history, past surgical history, family history and social history.      Vital Signs-Reviewed the patient's vital signs.   Patient Vitals for the past 12 hrs:            Temp  Pulse  Resp  BP  SpO2            03/31/20 1200  97.7 ??F (36.5 ??C)  (!) 57  18  (!) 154/69  96 %           Pulse Oximetry Analysis - 100% on RA     Cardiac Monitor:    Rate: 96 bpm   Rhythm: Bradycardia      ED EKG interpretation: 12:38 PM   Rhythm: Sinus bradycardia; and regular . Rate (approx.): 53; Axis: normal; PR Interval: 1st Degree AV block.  PR 226 ms; QRS interval: normal ; ST/T wave: non-specific changes;  This EKG was interpreted by Allen Derry MD,ED Provider.         Records Reviewed: Nursing Notes, Old Medical Records and Ambulance Run Sheet      Provider Notes (Medical Decision Making):    DDx: Dehydration, dysrhythmia, metabolic abnormality, ACS.      ED Course:    Initial assessment performed. The patients presenting problems have been discussed, and they are in agreement with the care plan formulated and outlined with them.  I have encouraged them to ask questions as they arise throughout their visit.        ED Course as of 03/31/20 1345       Thu Mar 31, 2020        1341  Workup grossly unremarkable.  CT head without any acute findings.  Clear chest x-ray.  CBC and CMP unremarkable other than elevated creatinine 1.63 with  BUN of 38.  Possibly prerenal with dehydration as cause.  Patient not orthostatic.  Will hydrate with IV fluids.  No old labs in system for comparison.  [CH]     1342  Spoke with Dr. Jeannine Kitten the hospitalist.  I  reviewed the patient's history, presentation labs and imaging findings.  He agreed to evaluate the patient for  admission. [CH]              ED Course User Index   [CH] Allen Derry, MD              PROGRESS NOTE      Pt reevaluated.  Reviewed labs and x-ray and CT findings with patient and son and daughter-in-law.  Plan on admission to hospitalist.   Written by Allen Derry, MD          We will admit to hospitalist.      Critical Care Time:    0      Disposition:   Admit      PLAN:   1. There are no discharge medications for this patient.  2.      Follow-up Information      None             Return to ED if worse         Diagnosis        Clinical Impression:       1.  Syncope and collapse         2.  Renal insufficiency                       Please note that this dictation was completed with Dragon, the computer voice recognition software.  Quite often unanticipated grammatical, syntax, homophones, and other interpretive errors are inadvertently transcribed by the computer software.  Please  disregard these errors.  Please excuse any errors that have escaped final proofreading.

## 2020-03-31 NOTE — ED Notes (Signed)
Dr. Constance Goltz remains at bedside

## 2020-03-31 NOTE — ED Notes (Signed)
 TRANSFER - OUT REPORT:    Verbal report given to [facility] RN on [employer]  being transferred to room 124 for routine progression of care       Report consisted of patient's Situation, Background, Assessment and   Recommendations(SBAR).     Information from the following report(s) SBAR, Kardex, ED Summary, Intake/Output, MAR, Recent Results, Med Rec Status and Cardiac Rhythm a-fib was reviewed with the receiving nurse.    Lines:   Saline Lock 03/31/20 Left Antecubital (Active)        Opportunity for questions and clarification was provided.      Patient transported with:   Monitor  Tech

## 2020-03-31 NOTE — ED Notes (Signed)
Unable to complete medication list due to pt does  Not have it with her and she does not know her medications  Pt is from out of town

## 2020-03-31 NOTE — H&P (Signed)
H&P by Charlynn Grimes, MD  at 03/31/20 1453                Author: Charlynn Grimes, MD  Service: Hospitalist  Author Type: Physician       Filed: 03/31/20 2304  Date of Service: 03/31/20 1453  Status: Addendum          Editor: Charlynn Grimes, MD (Physician)          Related Notes: Original Note by Charlynn Grimes, MD (Physician) filed at 03/31/20 [facility]    Admission History & Physical            03/31/2020 2:53 PM   Patient: Carol Robbins 1928/11/13   PCP: Alma Downs, Not On File, MD      HISTORY   Chief Complaint:      Chief Complaint       Patient presents with        ?  Syncope           HPI: 86 y.o. female  presenting for admission to [facility] for further evaluation and treatment for Syncope.   She  has a past medical history of A-fib ([place]) and Cataract.    She presented to the ED this morning following a syncopal event witnessed by family.  She woke up as usual and had breakfast and coffee with the family.   She took a shower and felt well, she dressed with underwear and a robe, and went into the living room to sit with family.  She was observed suddenly to slump and became difficult to arouse for a brief time.   She was not noted to have focal weakness in either side or speech difficulty.  She denied complaints of headache or dizziness.  She had no preseating symptoms to suggest something was happening.   Family contacted EMS and she was essentially back to normal by the time of their initial exam.      She has a history of paroxysmal atrial fib that was diagnosed and became problematic during a hospitalization in March 2021 when she presented for abdominal pain and was diagnosed with a duodenal perforated ulcer.   She underwent surgery with a Phillip Heal patch at her home hospital in [place].  She subsequently had a prolonged course in skilled rehab.  She had follow-up with her surgeon Dr. Ladona Horns on September 13.   He has been taking Protonix 40 mg daily  since surgery.  She has been followed by cardiology and has been taking amiodarone 200 mg daily, metoprolol 50 mg twice daily, Lasix 20 mg recently tapered to 10 mg and potassium supplement.   She has been staying with close relatives in the [place] area since November 18.  She has been eating and functioning well without health issues.      Assessment in the ED was generally unremarkable.  Lab work suggested some renal insufficiency with a BUN of 38 and a creatinine of 1.6.  Her baseline renal status is not known.  CT scan of the head was unremarkable.   EKG showed a sinus bradycardia with a rate of 53 with first-degree AV block and right bundle branch block.  Chest x-ray was noted for a normal cardiac silhouette without active lung disease.  No evidence of CHF or pleural effusion.  Patient is admitted to observation for cardiac monitoring, blood pressure monitoring, orthostatic check, carotid duplex exam, and follow-up CBC to rule out acute bleed.      Past Medical History:     Past Medical History:        Diagnosis  Date         ?  A-fib ([place])           ?  Cataract       HTN   GERD   PUD   Perforated Duodenal Ulcer      Past Surgical History:   History reviewed. No pertinent surgical history.   [place] for perforated duodenal ulcer tx Phillip Heal Patch Repair   Hysterectomy for ? Fibroids   G0      Medication:     Prior to Admission medications        Not on File     Metoprolol 34m bid   Amiodarone 2058mdaily   Lasix 2019maily recently tapered to 49m47mily   KCl 49me73mily   Protonix 40mg 3my      Also prior tx included:  Vit D3 1,000 units, Centrum Silver, B Complex, OScal 600mg d33m, Fish Oil 1000mg da73m Xopenex prn Neb      Allergies:   No Known Allergies      Social History:     Social History          Tobacco Use         ?  Smoking status:  Never Smoker     ?  Smokeless tobacco:  Never Used       Substance Use Topics         ?  Alcohol use:  Never         ?  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 No pertinent family history.   Never married      ROS:   Total of 12 systems reviewed as follows:   POSITIVE= bolded text  Negative = text not bolded         General:  fever, chills, sweats, generalized weakness, weight loss/gain, loss of appetite    Eyes:    blurred vision, eye pain, loss of vision, double vision   ENT:    rhinorrhea, pharyngitis    Respiratory:  cough, sputum production, SOB, DOE, wheezing, pleuritic pain    Cardiology:   chest pain, palpitations, orthopnea, PND, edema, syncope     Gastrointestinal:  abdominal pain , N/V, diarrhea, dysphagia, constipation, bleeding    Genitourinary:  frequency, urgency, dysuria, hematuria, incontinence, prostatism    Muskuloskeletal: arthralgia, myalgia, back pain   Hematology:   easy bruising, nose or gum bleeding, lymphadenopathy    Dermatological: rash, ulceration, pruritis, color change / jaundice   Endocrine:   hot flashes or polydipsia    Neurological:  headache, dizziness, confusion, focal weakness, paresthesia, speech difficulties, memory loss, gait difficulty   Psychological: feelings of anxiety, depression, agitation         PHYSICAL EXAM:   Patient Vitals for the past 24 hrs:            Temp  Pulse  Resp  BP  SpO2            03/31/20 1200  97.7 ??F (36.5 ??C)  (!) 57  18  (!) 154/69  96 %  General:    Alert, cooperative, no distress, appears stated age.      HEENT: Atraumatic, anicteric sclerae, pink conjunctivae      No oral ulcers, mucosa moist, throat clear, dentition fair   Neck:  Supple, symmetrical;   thyroid non tender.  No bruit   Lungs:   Clear to auscultation bilaterally.  No wheezing or rhonchi. No rales.   Chest wall:  No tenderness.  No accessory muscle use.   Heart:   Regular rhythm.  No  murmur.  No edema   Abdomen:   Soft, non-tender. Not distended.  Bowel sounds normal.  Midline sx scar healed   Extremities: No cyanosis.  No clubbing        Capillary refill normal,  Radial pulse 2+,  DP 1+   Skin:     Not pale.  Not jaundiced.  No rashes    Psych:  Not depressed.  Not anxious or agitated.   Neurologic: EOMs intact. No facial asymmetry. No aphasia or slurred speech.     Symmetrical strength, Sensation grossly intact. Alert and oriented X 4.      Lab Data Reviewed:       Recent Results (from the past 24 hour(s))     EKG, 12 LEAD, INITIAL          Collection Time: 03/31/20 12:03 PM         Result  Value  Ref Range            Ventricular Rate  53  BPM       Atrial Rate  53  BPM       P-R Interval  226  ms       QRS Duration  106  ms       Q-T Interval  490  ms       QTC Calculation (Bezet)  459  ms       Calculated P Axis  43  degrees       Calculated R Axis  59  degrees       Calculated T Axis  3  degrees       Diagnosis                 Sinus bradycardia with 1st degree AV block   Incomplete right bundle branch block   Borderline ECG   No previous ECGs available          CBC WITH AUTOMATED DIFF          Collection Time: 03/31/20 12:07 PM         Result  Value  Ref Range            WBC  6.1  3.6 - 11.0 K/uL       RBC  3.97  3.80 - 5.20 M/uL       HGB  13.1  11.5 - 16.0 g/dL       HCT  39.4  35.0 - 47.0 %       MCV  99.2 (H)  80.0 - 99.0 FL       MCH  33.0  26.0 - 34.0 PG       MCHC  33.2  30.0 - 36.5 g/dL       RDW  15.9 (H)  11.5 - 14.5 %       PLATELET  305  150 - 400 K/uL       MPV  9.6  8.9 - 12.9 FL  NRBC  0.0  0 PER 100 WBC       ABSOLUTE NRBC  0.00  0.00 - 0.01 K/uL       NEUTROPHILS  61  32 - 75 %       LYMPHOCYTES  26  12 - 49 %       MONOCYTES  10  5 - 13 %       EOSINOPHILS  1  0 - 7 %       BASOPHILS  1  0 - 1 %       IMMATURE GRANULOCYTES  1 (H)  0.0 - 0.5 %       ABS. NEUTROPHILS  3.7  1.8 - 8.0 K/UL       ABS. LYMPHOCYTES  1.6  0.8 - 3.5 K/UL       ABS. MONOCYTES  0.6  0.0 - 1.0 K/UL       ABS. EOSINOPHILS  0.1  0.0 - 0.4 K/UL       ABS. BASOPHILS  0.1  0.0 - 0.1 K/UL       ABS. IMM. GRANS.  0.0  0.00 - 0.04 K/UL       DF  AUTOMATED           METABOLIC PANEL, COMPREHENSIVE          Collection Time: 03/31/20 12:07 PM         Result  Value  Ref Range            Sodium  138  136 - 145 mmol/L       Potassium  4.2  3.5 - 5.1 mmol/L       Chloride  102  97 - 108 mmol/L       CO2  26  21 - 32 mmol/L       Anion gap  10  5 - 15 mmol/L       Glucose  117 (H)  65 - 100 mg/dL       BUN  38 (H)  6 - 20 MG/DL       Creatinine  1.63 (H)  0.55 - 1.02 MG/DL       BUN/Creatinine ratio  23 (H)  12 - 20         GFR est AA  36 (L)  >60 ml/min/1.14m       GFR est non-AA  30 (L)  >60 ml/min/1.711m      Calcium  8.9  8.5 - 10.1 MG/DL       Bilirubin, total  0.5  0.2 - 1.0 MG/DL       ALT (SGPT)  36  12 - 78 U/L       AST (SGOT)  24  15 - 37 U/L       Alk. phosphatase  62  45 - 117 U/L       Protein, total  7.3  6.4 - 8.2 g/dL       Albumin  3.3 (L)  3.5 - 5.0 g/dL       Globulin  4.0  2.0 - 4.0 g/dL       A-G Ratio  0.8 (L)  1.1 - 2.2         TROPONIN-HIGH SENSITIVITY          Collection Time: 03/31/20 12:07 PM         Result  Value  Ref Range  Troponin-High Sensitivity  8  0 - 51 ng/L       SAMPLES BEING HELD          Collection Time: 03/31/20 12:07 PM         Result  Value  Ref Range            SAMPLES BEING HELD  1SST,1BLUE         COMMENT                  Add-on orders for these samples will be processed based on acceptable specimen integrity and analyte stability, which may vary by analyte.       URINALYSIS W/MICROSCOPIC          Collection Time: 03/31/20  1:15 PM         Result  Value  Ref Range            Color  YELLOW/STRAW          Appearance  CLEAR  CLEAR         Specific gravity  1.020  1.003 - 1.030         pH (UA)  6.0  5.0 - 8.0         Protein  Negative  NEG mg/dL       Glucose  Negative  NEG mg/dL       Ketone  Negative  NEG mg/dL       Bilirubin  Negative  NEG         Blood  Negative  NEG         Urobilinogen  0.2  0.2 - 1.0 EU/dL       Nitrites  Negative  NEG         Leukocyte Esterase  SMALL (A)  NEG         WBC  5-10  0 - 4 /hpf       RBC   0-5  0 - 5 /hpf       Epithelial cells  FEW  FEW /lpf            Bacteria  Negative  NEG /hpf           EKG: SB 53 bpm, !st Deg AVB, Incomplete RBBB, no prev         Radiology:   CT HEAD   The ventricles and sulci are normal in size, shape and configuration.. There is   no significant white matter disease. There is no intracranial hemorrhage,   extra-axial collection, or mass effect. The basilar cisterns are open. No CT   evidence of acute infarct.??   The bone windows demonstrate no abnormalities. The visualized portions of the   paranasal sinuses and mastoid air cells are clear.??   IMPRESSION:  No acute intracranial abnormalities.      pCXR   A single frontal view of the chest was obtained. The cardiac silhouette is   grossly normal in size and there is no evidence of active lung disease. A skin   fold projects over the lateral aspect of the left hemithorax. The costophrenic   angles are sharp in appearance.??   IMPRESSION:  No evidence of active intrathoracic disease.         Care Plan discussed with:       Patient  x         Family        RN  x      Case Manager  Consultant          Expected  Disposition:       Home with Family  x        HH/PT/OT/RN       SNF/LTC          SAHR          TOTAL TIME:  28 Minutes              Comments           x  Reviewed previous records         >50% of visit spent in counseling and coordination of care  x  Discussion with patient and/or family and questions answered           _______________________________________________________   Given the patient's current clinical presentation, I have a high level of concern for decompensation if discharged from the emergency department.  Complex decision making was performed, which includes reviewing the patient's available past medical records,  laboratory results, and x-ray films.          My assessment of this patient's clinical condition and my plan of care is as follows.      ASSESSMENT / PLAN      Principal Problem:      Syncope (03/31/2020)   Neuro check q4 x 24   Telemetry   Orthostatic BP check   Serial lab - Hg to r/o acute blood loss   Carotid Duplex if staffing allows      Active Problems:     Paroxysmal atrial fibrillation ([place]) (03/31/2020)   EKG noted for SB   Reduce Metoprolol to 32m bid   Telemetry   Cont Amiodarone - would need her Cardiologist plan taper is he felt warranted.    Check TSH on Amiodarone        PUD (peptic ulcer disease) (03/31/2020)   Cont Protonix   Serial Hg to r/o acute bleed        HTN (hypertension) (03/31/2020)   Assess for orthostatic drop   Reduce Metoprolol to 284mbid   D/c Lasix, no s/s for volume overload   Will need 2nd antihypertensive if needed for BP control - one that will not drop heart rate         SAFETY:    Code Status:Full   DVT prophylaxis:Lovenox   Stress Ulcer prophylaxis: Protonix po   Bladder catheter:no  Family Contact Info:   Primary Emergency Contact: YODarcus AustinHome Phone: [phone] [facility]   Disposition: TBD, likely home when stable   Admission status:  Observation  Telemetry      -Tentative plan of care discussed with patient / family, who demonstrated understanding and is in agreement to the above   -Case was reviewed with the ED Provider, HEAllen DerryMDCumberland GapMD   BSPristine Surgery Center Incospitalist   [phone]

## 2020-03-31 NOTE — ED Notes (Signed)
Pt arrived by EMS with syncopal episode.   Per EMS pt was in the shower today when she had a syncopal episode.  1105: EMS reported pt was in the shower had a syncopal episode, initially was nonverbal 1110: family reported pt was able to get up and ambulate, 1115: pt was awake alert and oriented x 4.  EMS arrived and pt was awake alert and oriented X 4, pt denies cp/sob pt a was not orthostatic and her FAST was negative.  Pt arrived awake alert and oriented x 4, pt educated on ER flow.  NIH on arrival 0

## 2020-04-01 LAB — CBC WITH AUTOMATED DIFF
ABS. BASOPHILS: 0.1 10*3/uL (ref 0.0–0.1)
ABS. EOSINOPHILS: 0.1 10*3/uL (ref 0.0–0.4)
ABS. IMM. GRANS.: 0 10*3/uL (ref 0.00–0.04)
ABS. LYMPHOCYTES: 2.3 10*3/uL (ref 0.8–3.5)
ABS. MONOCYTES: 0.7 10*3/uL (ref 0.0–1.0)
ABS. NEUTROPHILS: 3.1 10*3/uL (ref 1.8–8.0)
ABSOLUTE NRBC: 0 10*3/uL (ref 0.00–0.01)
BASOPHILS: 1 % (ref 0–1)
EOSINOPHILS: 2 % (ref 0–7)
HCT: 36.1 % (ref 35.0–47.0)
HGB: 12.1 g/dL (ref 11.5–16.0)
IMMATURE GRANULOCYTES: 0 % (ref 0.0–0.5)
LYMPHOCYTES: 37 % (ref 12–49)
MCH: 32.6 PG (ref 26.0–34.0)
MCHC: 33.5 g/dL (ref 30.0–36.5)
MCV: 97.3 FL (ref 80.0–99.0)
MONOCYTES: 11 % (ref 5–13)
MPV: 9.7 FL (ref 8.9–12.9)
NEUTROPHILS: 49 % (ref 32–75)
NRBC: 0 PER 100 WBC
PLATELET: 269 10*3/uL (ref 150–400)
RBC: 3.71 M/uL — ABNORMAL LOW (ref 3.80–5.20)
RDW: 15.6 % — ABNORMAL HIGH (ref 11.5–14.5)
WBC: 6.3 10*3/uL (ref 3.6–11.0)

## 2020-04-01 LAB — EKG, 12 LEAD, INITIAL
Atrial Rate: 53 {beats}/min
Calculated P Axis: 43 degrees
Calculated R Axis: 59 degrees
Calculated T Axis: 3 degrees
P-R Interval: 226 ms
Q-T Interval: 490 ms
QRS Duration: 106 ms
QTC Calculation (Bezet): 459 ms
Ventricular Rate: 53 {beats}/min

## 2020-04-01 LAB — METABOLIC PANEL, BASIC
Anion gap: 9 mmol/L (ref 5–15)
BUN/Creatinine ratio: 21 — ABNORMAL HIGH (ref 12–20)
BUN: 32 MG/DL — ABNORMAL HIGH (ref 6–20)
CO2: 27 mmol/L (ref 21–32)
Calcium: 8.6 MG/DL (ref 8.5–10.1)
Chloride: 104 mmol/L (ref 97–108)
Creatinine: 1.52 MG/DL — ABNORMAL HIGH (ref 0.55–1.02)
GFR est AA: 39 mL/min/{1.73_m2} — ABNORMAL LOW (ref >60)
GFR est non-AA: 32 mL/min/{1.73_m2} — ABNORMAL LOW (ref >60)
Glucose: 102 mg/dL — ABNORMAL HIGH (ref 65–100)
Potassium: 4 mmol/L (ref 3.5–5.1)
Sodium: 140 mmol/L (ref 136–145)

## 2020-04-01 LAB — MAGNESIUM
Magnesium: 2.2 mg/dL (ref 1.6–2.4)
Magnesium: 2.2 mg/dL (ref 1.6–2.4)

## 2020-04-01 LAB — TSH 3RD GENERATION
TSH: 1.28 u[IU]/mL (ref 0.36–3.74)
TSH: 1.28 u[IU]/mL (ref 0.36–3.74)

## 2020-04-01 LAB — BASIC METABOLIC PANEL
Anion Gap: 9 mmol/L (ref 5–15)
BUN: 32 MG/DL — ABNORMAL HIGH (ref 6–20)
Bun/Cre Ratio: 21 — ABNORMAL HIGH (ref 12–20)
CO2: 27 mmol/L (ref 21–32)
Calcium: 8.6 MG/DL (ref 8.5–10.1)
Chloride: 104 mmol/L (ref 97–108)
Creatinine: 1.52 MG/DL — ABNORMAL HIGH (ref 0.55–1.02)
EGFR IF NonAfrican American: 32 mL/min/{1.73_m2} — ABNORMAL LOW (ref >60)
GFR African American: 39 mL/min/{1.73_m2} — ABNORMAL LOW (ref >60)
Glucose: 102 mg/dL — ABNORMAL HIGH (ref 65–100)
Potassium: 4 mmol/L (ref 3.5–5.1)
Sodium: 140 mmol/L (ref 136–145)

## 2020-04-01 LAB — CBC WITH AUTO DIFFERENTIAL
Basophils %: 1 % (ref 0–1)
Basophils Absolute: 0.1 10*3/uL (ref 0.0–0.1)
Eosinophils %: 2 % (ref 0–7)
Eosinophils Absolute: 0.1 10*3/uL (ref 0.0–0.4)
Granulocyte Absolute Count: 0 10*3/uL (ref 0.00–0.04)
Hematocrit: 36.1 % (ref 35.0–47.0)
Hemoglobin: 12.1 g/dL (ref 11.5–16.0)
Immature Granulocytes: 0 % (ref 0.0–0.5)
Lymphocytes %: 37 % (ref 12–49)
Lymphocytes Absolute: 2.3 10*3/uL (ref 0.8–3.5)
MCH: 32.6 PG (ref 26.0–34.0)
MCHC: 33.5 g/dL (ref 30.0–36.5)
MCV: 97.3 FL (ref 80.0–99.0)
MPV: 9.7 FL (ref 8.9–12.9)
Monocytes %: 11 % (ref 5–13)
Monocytes Absolute: 0.7 10*3/uL (ref 0.0–1.0)
NRBC Absolute: 0 10*3/uL (ref 0.00–0.01)
Neutrophils %: 49 % (ref 32–75)
Neutrophils Absolute: 3.1 10*3/uL (ref 1.8–8.0)
Nucleated RBCs: 0 PER 100 WBC
Platelets: 269 10*3/uL (ref 150–400)
RBC: 3.71 M/uL — ABNORMAL LOW (ref 3.80–5.20)
RDW: 15.6 % — ABNORMAL HIGH (ref 11.5–14.5)
WBC: 6.3 10*3/uL (ref 3.6–11.0)

## 2020-04-01 LAB — EKG 12-LEAD
Atrial Rate: 53 {beats}/min
P Axis: 43 degrees
P-R Interval: 226 ms
Q-T Interval: 490 ms
QRS Duration: 106 ms
QTc Calculation (Bazett): 459 ms
R Axis: 59 degrees
T Axis: 3 degrees
Ventricular Rate: 53 {beats}/min

## 2020-04-01 MED ORDER — METOPROLOL SUCCINATE SR 25 MG 24 HR TAB
25 mg | ORAL_TABLET | Freq: Every day | ORAL | 2 refills | Status: AC
Start: 2020-04-01 — End: 2020-06-30

## 2020-04-01 MED ORDER — METOPROLOL SUCCINATE SR 25 MG 24 HR TAB
25 mg | Freq: Every day | ORAL | Status: DC
Start: 2020-04-01 — End: 2020-04-01
  Administered 2020-04-01: 16:00:00 via ORAL

## 2020-04-01 MED ORDER — AMLODIPINE 10 MG TAB
10 mg | ORAL_TABLET | Freq: Every day | ORAL | 2 refills | Status: AC
Start: 2020-04-01 — End: ?

## 2020-04-01 MED FILL — AMIODARONE 200 MG TAB: 200 mg | ORAL | Qty: 1

## 2020-04-01 MED FILL — ENOXAPARIN 30 MG/0.3 ML SUB-Q SYRINGE: 30 mg/0.3 mL | SUBCUTANEOUS | Qty: 0.3

## 2020-04-01 MED FILL — PANTOPRAZOLE 40 MG TAB, DELAYED RELEASE: 40 mg | ORAL | Qty: 1

## 2020-04-01 MED FILL — METOPROLOL SUCCINATE SR 25 MG 24 HR TAB: 25 mg | ORAL | Qty: 1

## 2020-04-01 NOTE — Progress Notes (Signed)
Dr. Kellie Simmering notified of heartrate.  Instructed to give scheduled Amiodarone.

## 2020-04-01 NOTE — Progress Notes (Signed)
 PHYSICAL THERAPY EVALUATION/DISCHARGE  Patient: Carol Robbins (86 y.o. female)  Date: 04/01/2020  Primary Diagnosis: Syncope [R55]        Precautions:  Fall      ASSESSMENT  Based on the objective data described below, the patient presents with intact strength, balance, vision and cognition sufficient to return to her living situation with family. She is not in need of further acute care physical therapy services.    Functional Outcome Measure:  The patient scored 26 on the functional gait assessment outcome measure which is above predicted score of 20.8 for this patients age group.  Patient passes romberg assessment.      Other factors to consider for discharge: currently living with two adult family members     Further skilled acute physical therapy is not indicated at this time.     PLAN :  Recommendation for discharge: (in order for the patient to meet his/her long term goals)  No skilled physical therapy/ follow up rehabilitation needs identified at this time.    This discharge recommendation:  Was made in collaboration with provider.    IF patient discharges home will need the following DME: none       SUBJECTIVE:   Patient stated she feels as if she is at her baseline of function    OBJECTIVE DATA SUMMARY:   HISTORY:    Past Medical History:   Diagnosis Date    A-fib [facility])     Cataract    History reviewed. No pertinent surgical history.    Prior level of function: visiting from out of town, normally lives alone, in a dwelling with stairs within which she negotiates without device or assist. Patient is driving, does her own grocery shopping. She denies falls within past six months  Personal factors and/or comorbidities impacting plan of care: advanced age.     Home Situation  Home Environment: Private residence  # Steps to Enter: 3  Rails to Enter: Yes  Hand Rails : Bilateral  One/Two Story Residence: Two Nutritional therapist of Interior Steps: 12  Interior Rails: Veterinary surgeon Available: No  Living Alone:  Yes  Support Systems: Other Family Member(s)  Patient Expects to be Discharged to:: Other (comment) (home with family)  Current DME Used/Available at Home: None  Tub or Shower Type: Other (comment) (walk in shower)    EXAMINATION/PRESENTATION/DECISION MAKING:   Critical Behavior:  Neurologic State: Alert  Orientation Level: Oriented X4  Cognition: Follows commands     Range Of Motion:      Wfl x 4  Strength:  wfl x 4     Tone & Sensation:          Normotonic with intact light touch and kinesthesia in feet                       Functional Mobility:  Bed Mobility:     Supine to Sit: Independent  Sit to Supine: Independent  Scooting: Independent  Transfers:  Sit to Stand: Independent  Stand to Sit: Independent                       Balance:   Sitting: Intact  Standing: Intact;Without support  Ambulation/Gait Training:              Gait Description (WDL): Within defined limits         Stairs: a/descend training stairs x 4 with r rail, no assist or cues,  foot over foot            Physical Therapy Evaluation Charge Determination   History Examination Presentation Decision-Making   MEDIUM  Complexity : 1-2 comorbidities / personal factors will impact the outcome/ POC  LOW Complexity : 1-2 Standardized tests and measures addressing body structure, function, activity limitation and / or participation in recreation  LOW Complexity : Stable, uncomplicated  LOW Complexity : FOTO score of 75-100      Based on the above components, the patient evaluation is determined to be of the following complexity level: LOW     Pain Rating:  Denies pain    Activity Tolerance:   Good      After treatment patient left in no apparent distress:   Sitting in chair, Caregiver / family present    COMMUNICATION/EDUCATION:   The patient's plan of care was discussed with: Physician.     Fall prevention education was provided and the patient/caregiver indicated understanding.    Thank you for this referral.  Heron MARLA Lob, PT   Time Calculation:  30 mins

## 2020-04-01 NOTE — Progress Notes (Signed)
Problem: Falls - Risk of  Goal: *Absence of Falls  Description: Document Carol Robbins Fall Risk and appropriate interventions in the flowsheet.  Note: Fall Risk Interventions:  Mobility Interventions: Bed/chair exit alarm,Patient to call before getting OOB         Medication Interventions: Patient to call before getting OOB

## 2020-04-01 NOTE — Progress Notes (Signed)
Discharge instructions reviewed with patient and nephew.   Personal belongings returned: robe, cell phone.  Home meds returned: N/A  To front entrance via wheelchair  Discharged home with  nephew @ 1145.

## 2020-04-01 NOTE — Discharge Summary (Signed)
Hospitalist Discharge Summary     Patient ID:  Carol Robbins  [id number]  86 y.o.  07-11-1928  03/31/2020    PCP on record: Bshsi, Not On File, MD    Admit date: 03/31/2020  Discharge date and time: 04/01/2020    DISCHARGE DIAGNOSIS:  Syncope secondary to symptomatic bradycardia Resolved  Atrial fibrillation rate controlled   Hx of PUD Stable and asyptomatic    CONSULTATIONS:  None    Excerpted HPI from H&P of Carol Kettle, MD:  HPI: 86 y.o. female presenting for admission to [facility] for further evaluation and treatment for Syncope.  She  has a past medical history of A-fib (HCC) and Cataract.   She presented to the ED this morning following a syncopal event witnessed by family.  She woke up as usual and had breakfast and coffee with the family.  She took a shower and felt well, she dressed with underwear and a robe, and went into the living room to sit with family.  She was observed suddenly to slump and became difficult to arouse for a brief time.  She was not noted to have focal weakness in either side or speech difficulty.  She denied complaints of headache or dizziness.  She had no preseating symptoms to suggest something was happening.  Family contacted EMS and she was essentially back to normal by the time of their initial exam.  ??  She has a history of paroxysmal atrial fib that was diagnosed and became problematic during a hospitalization in March 2021 when she presented for abdominal pain and was diagnosed with a duodenal perforated ulcer.  She underwent surgery with a Cheree Ditto patch at her home hospital in [place].  She subsequently had a prolonged course in skilled rehab.  She had follow-up with her surgeon Dr. Marcha Solders on September 13.  He has been taking Protonix 40 mg daily since surgery.  She has been followed by cardiology and has been taking amiodarone 200 mg daily, metoprolol 50 mg twice daily, Lasix 20 mg recently tapered to 10 mg and potassium supplement.  She has been staying with close  relatives in the [place] area since November 18.  She has been eating and functioning well without health issues.  ??  Assessment in the ED was generally unremarkable.  Lab work suggested some renal insufficiency with a BUN of 38 and a creatinine of 1.6.  Her baseline renal status is not known.  CT scan of the head was unremarkable.  EKG showed a sinus bradycardia with a rate of 53 with first-degree AV block and right bundle branch block.  Chest x-ray was noted for a normal cardiac silhouette without active lung disease.  No evidence of CHF or pleural effusion.  ??  Patient is admitted to observation for cardiac monitoring, blood pressure monitoring, orthostatic check, carotid duplex exam, and follow-up CBC to rule out acute bleed.    ______________________________________________________________________  DISCHARGE SUMMARY/HOSPITAL COURSE:   The  Patient who was admitted with the above complaints was manages as case of syncope has been placed on cardiac monitor and had neurocheks, medications were adjusted and also has bedside PT/OT evaluation.  The  Patient remained clnically stable and had no another syncopal episodes and on bedside examination she was seen as awake verbal and fully oriented and young and strong for her age.  The patient di d not have any new events overnight and this morning on the cardiac monitor , she only had HR in low 50s  and first degree AV block unchanged from the  time of admission and was asymptomatic. The  Patient had PT evaluation and she was able to walk the hallway with the therapist. The  Patient has no complaints of fatigue, chest pain, SOB or weakness of the  Extremity.The  Patient snext of Kin who was in the  Room was updated about her status and progress; he also mentioned that the patient lives alone in [place] and suggested  She stays with them for a while for her safety.  Medication wise the metoprolol was reduced to the lowest dosage 12.5 once daily.The amlodipine was added for  control of BP and has no bradycardic side effect. The patient on discharge was advised to establish care with PCP and also see cardiologist for continuation of care, additional work ups and also medications adjustment.  The patients orthostatic vital signs were checked and no major changes noted BP supine I s138/64 HR 68. Standing BP is 130/68 , HR is 78/Min.   The patient and her next of kin advised to return back to the ED if she will have any similar episodes of syncope, sob, palpitation fatigue or weakness of the extremity or abnormal speech.      _______________________________________________________________________  Patient seen and examined by me on discharge day.  Pertinent Findings:  Gen:    Not in distress  Chest: Clear lungs  CVS:   Regular rhythm.  No edema  Abd:  Soft, not distended, not tender  Neuro:  Alert, Awake and fully oriented.    LABS:  Pertinent labs include:  Recent Labs     04/01/20  0500 03/31/20  1207   WBC 6.3 6.1   HGB 12.1 13.1   HCT 36.1 39.4   PLT 269 305     Recent Labs     04/01/20  0500 03/31/20  1207   NA 140 138   K 4.0 4.2   CL 104 102   CO2 27 26   GLU 102* 117*   BUN 32* 38*   CREA 1.52* 1.63*   CA 8.6 8.9   MG 2.2  --    ALB  --  3.3*   TBILI  --  0.5   ALT  --  36       IMAGING:  XR CHEST SNGL V    Result Date: 03/31/2020  Chest single view dated 03/31/2020 History is chest pain A single frontal view of the chest was obtained. The cardiac silhouette is grossly normal in size and there is no evidence of active lung disease. A skin fold projects over the lateral aspect of the left hemithorax. The costophrenic angles are sharp in appearance.     No evidence of active intrathoracic disease.    CT HEAD WO CONT    Result Date: 03/31/2020  EXAM: CT HEAD WO CONT INDICATION: syncope,fall COMPARISON: None. CONTRAST: None. TECHNIQUE: Unenhanced CT of the head was performed using 5 mm images. Brain and bone windows were generated. Coronal and sagittal reformats. CT dose reduction was  achieved through use of a standardized protocol tailored for this examination and automatic exposure control for dose modulation.  FINDINGS: The ventricles and sulci are normal in size, shape and configuration.. There is no significant white matter disease. There is no intracranial hemorrhage, extra-axial collection, or mass effect. The basilar cisterns are open. No CT evidence of acute infarct. The bone windows demonstrate no abnormalities. The visualized portions of the paranasal sinuses and mastoid air cells are  clear.     No acute intracranial abnormalities.       EKG:   Ventricular Rate BPM 53    Atrial Rate BPM 53    P-R Interval ms 226    QRS Duration ms 106    Q-T Interval ms 490    QTC Calculation (Bezet) ms 459    Calculated P Axis degrees 43    Calculated R Axis degrees 59    Calculated T Axis degrees 3    Diagnosis  Sinus bradycardia with 1st degree AV block   Incomplete right bundle branch block   Borderline ECG   No previous ECGs available   Confirmed by Carol Robbins., MD, --- [phone]) on 03/31/2020 9:31:37 PM         _______________________________________________________________________  DISCHARGE MEDICATIONS:   Current Discharge Medication List      START taking these medications    Details   amLODIPine (NORVASC) 10 mg tablet Take 1 Tablet by mouth daily.  Qty: 90 Tablet, Refills: 2  Start date: 04/01/2020      metoprolol succinate (Toprol XL) 25 mg XL tablet Take 0.5 Tablets by mouth daily for 90 days.  Qty: 45 Tablet, Refills: 2  Start date: 04/01/2020, End date: 06/30/2020         CONTINUE these medications which have NOT CHANGED    Details   pantoprazole (PROTONIX) 40 mg tablet Take 40 mg by mouth daily.      amiodarone (CORDARONE) 200 mg tablet Take 200 mg by mouth daily.      potassium chloride SR (KLOR-CON 10) 10 mEq tablet Take 10 mEq by mouth once.         STOP taking these medications       metoprolol tartrate (LOPRESSOR) 50 mg tablet Comments:   Reason for Stopping:         potassium  citrate (UROCIT-K10) 10 mEq (1,080 mg) TbER Comments:   Reason for Stopping:                 Patient Follow Up Instructions:   Activity: Activity as tolerated  Diet: Cardiac Diet  Wound Care: None needed    Follow-up Establish care with PCP in 1 wek in . Also  needs cardiologist appointment and follow up    Follow-up tests/labsWould need ECHO and carotids doppler    Follow-up Information     Follow up With Specialties Details Why Contact Info    Bshsi, Not On File, MD Oncology   Not On File (58) Patient has a PCP but that physician is not listed in [facility].          ________________________________________________________________    Risk of deterioration: Low    Condition at Discharge:  Stable  __________________________________________________________________    Disposition  Home with family, no needs    ____________________________________________________________________    Code Status: Full Code  ___________________________________________________________________      Total time in minutes spent coordinating this discharge (includes going over instructions, follow-up, prescriptions, and preparing report for sign off to her PCP) :  35 minutes    Signed:  Rodell Perna, MD

## 2020-04-04 ENCOUNTER — Inpatient Hospital Stay: Attending: Internal Medicine | Primary: Student in an Organized Health Care Education/Training Program

## 2021-01-05 DIAGNOSIS — Z299 Encounter for prophylactic measures, unspecified: Secondary | ICD-10-CM | POA: Diagnosis not present

## 2021-02-03 DIAGNOSIS — M5134 Other intervertebral disc degeneration, thoracic region: Secondary | ICD-10-CM | POA: Diagnosis not present

## 2021-02-03 DIAGNOSIS — M47812 Spondylosis without myelopathy or radiculopathy, cervical region: Secondary | ICD-10-CM | POA: Diagnosis not present

## 2021-02-03 DIAGNOSIS — M40209 Unspecified kyphosis, site unspecified: Secondary | ICD-10-CM | POA: Diagnosis not present

## 2021-03-08 DIAGNOSIS — Z7189 Other specified counseling: Secondary | ICD-10-CM | POA: Diagnosis not present

## 2021-04-07 DIAGNOSIS — E7800 Pure hypercholesterolemia, unspecified: Secondary | ICD-10-CM | POA: Diagnosis not present

## 2021-04-07 DIAGNOSIS — D649 Anemia, unspecified: Secondary | ICD-10-CM | POA: Diagnosis not present

## 2021-04-07 DIAGNOSIS — M1990 Unspecified osteoarthritis, unspecified site: Secondary | ICD-10-CM | POA: Diagnosis not present

## 2021-04-17 DIAGNOSIS — M47816 Spondylosis without myelopathy or radiculopathy, lumbar region: Secondary | ICD-10-CM | POA: Diagnosis not present

## 2021-06-05 DIAGNOSIS — G309 Alzheimer's disease, unspecified: Secondary | ICD-10-CM | POA: Diagnosis not present

## 2021-06-05 DIAGNOSIS — M545 Low back pain, unspecified: Secondary | ICD-10-CM | POA: Diagnosis not present

## 2021-06-05 DIAGNOSIS — L110 Acquired keratosis follicularis: Secondary | ICD-10-CM | POA: Diagnosis not present

## 2021-06-05 DIAGNOSIS — Z299 Encounter for prophylactic measures, unspecified: Secondary | ICD-10-CM | POA: Diagnosis not present

## 2021-06-05 DIAGNOSIS — M79671 Pain in right foot: Secondary | ICD-10-CM | POA: Diagnosis not present

## 2021-06-05 DIAGNOSIS — R609 Edema, unspecified: Secondary | ICD-10-CM | POA: Diagnosis not present

## 2021-06-05 DIAGNOSIS — M79672 Pain in left foot: Secondary | ICD-10-CM | POA: Diagnosis not present

## 2021-06-05 DIAGNOSIS — K432 Incisional hernia without obstruction or gangrene: Secondary | ICD-10-CM | POA: Diagnosis not present

## 2021-06-05 DIAGNOSIS — Z789 Other specified health status: Secondary | ICD-10-CM | POA: Diagnosis not present

## 2021-06-05 DIAGNOSIS — Z6820 Body mass index (BMI) 20.0-20.9, adult: Secondary | ICD-10-CM | POA: Diagnosis not present

## 2021-06-05 DIAGNOSIS — I739 Peripheral vascular disease, unspecified: Secondary | ICD-10-CM | POA: Diagnosis not present

## 2021-06-21 ENCOUNTER — Emergency Department (HOSPITAL_COMMUNITY): Payer: Medicare Other

## 2021-06-21 ENCOUNTER — Emergency Department (HOSPITAL_COMMUNITY)
Admission: EM | Admit: 2021-06-21 | Discharge: 2021-06-21 | Discharge status: Absent without leave | Payer: Self-pay | Source: Home / Self Care

## 2021-06-21 ENCOUNTER — Encounter (HOSPITAL_COMMUNITY): Payer: Self-pay

## 2021-06-21 ENCOUNTER — Inpatient Hospital Stay (HOSPITAL_COMMUNITY)
Admission: EM | Admit: 2021-06-21 | Discharge: 2021-06-23 | DRG: 536 | Disposition: A | Discharge status: Skilled Nursing Facility | Payer: Medicare Other | Attending: Internal Medicine | Admitting: Internal Medicine

## 2021-06-21 ENCOUNTER — Other Ambulatory Visit: Payer: Self-pay

## 2021-06-21 DIAGNOSIS — S32592A Other specified fracture of left pubis, initial encounter for closed fracture: Secondary | ICD-10-CM | POA: Diagnosis present

## 2021-06-21 DIAGNOSIS — I10 Essential (primary) hypertension: Secondary | ICD-10-CM

## 2021-06-21 DIAGNOSIS — Z79899 Other long term (current) drug therapy: Secondary | ICD-10-CM | POA: Diagnosis not present

## 2021-06-21 DIAGNOSIS — Z791 Long term (current) use of non-steroidal anti-inflammatories (NSAID): Secondary | ICD-10-CM

## 2021-06-21 DIAGNOSIS — M1990 Unspecified osteoarthritis, unspecified site: Secondary | ICD-10-CM | POA: Diagnosis present

## 2021-06-21 DIAGNOSIS — S32511A Fracture of superior rim of right pubis, initial encounter for closed fracture: Secondary | ICD-10-CM | POA: Diagnosis not present

## 2021-06-21 DIAGNOSIS — N189 Chronic kidney disease, unspecified: Secondary | ICD-10-CM | POA: Diagnosis not present

## 2021-06-21 DIAGNOSIS — I129 Hypertensive chronic kidney disease with stage 1 through stage 4 chronic kidney disease, or unspecified chronic kidney disease: Secondary | ICD-10-CM | POA: Diagnosis not present

## 2021-06-21 DIAGNOSIS — S0003XA Contusion of scalp, initial encounter: Secondary | ICD-10-CM | POA: Diagnosis present

## 2021-06-21 DIAGNOSIS — S32309A Unspecified fracture of unspecified ilium, initial encounter for closed fracture: Secondary | ICD-10-CM | POA: Diagnosis not present

## 2021-06-21 DIAGNOSIS — D539 Nutritional anemia, unspecified: Secondary | ICD-10-CM | POA: Diagnosis not present

## 2021-06-21 DIAGNOSIS — S329XXA Fracture of unspecified parts of lumbosacral spine and pelvis, initial encounter for closed fracture: Secondary | ICD-10-CM | POA: Diagnosis not present

## 2021-06-21 DIAGNOSIS — S199XXA Unspecified injury of neck, initial encounter: Secondary | ICD-10-CM | POA: Diagnosis not present

## 2021-06-21 DIAGNOSIS — Z8711 Personal history of peptic ulcer disease: Secondary | ICD-10-CM

## 2021-06-21 DIAGNOSIS — R6889 Other general symptoms and signs: Secondary | ICD-10-CM | POA: Diagnosis not present

## 2021-06-21 DIAGNOSIS — S32301A Unspecified fracture of right ilium, initial encounter for closed fracture: Secondary | ICD-10-CM | POA: Diagnosis not present

## 2021-06-21 DIAGNOSIS — Z66 Do not resuscitate: Secondary | ICD-10-CM | POA: Diagnosis present

## 2021-06-21 DIAGNOSIS — S32591A Other specified fracture of right pubis, initial encounter for closed fracture: Secondary | ICD-10-CM | POA: Diagnosis not present

## 2021-06-21 DIAGNOSIS — M1611 Unilateral primary osteoarthritis, right hip: Secondary | ICD-10-CM | POA: Diagnosis not present

## 2021-06-21 DIAGNOSIS — Z9049 Acquired absence of other specified parts of digestive tract: Secondary | ICD-10-CM | POA: Diagnosis not present

## 2021-06-21 DIAGNOSIS — Z20822 Contact with and (suspected) exposure to covid-19: Secondary | ICD-10-CM | POA: Diagnosis not present

## 2021-06-21 DIAGNOSIS — M8448XA Pathological fracture, other site, initial encounter for fracture: Secondary | ICD-10-CM | POA: Diagnosis not present

## 2021-06-21 DIAGNOSIS — W19XXXA Unspecified fall, initial encounter: Secondary | ICD-10-CM | POA: Diagnosis not present

## 2021-06-21 DIAGNOSIS — K279 Peptic ulcer, site unspecified, unspecified as acute or chronic, without hemorrhage or perforation: Secondary | ICD-10-CM

## 2021-06-21 DIAGNOSIS — S3282XA Multiple fractures of pelvis without disruption of pelvic ring, initial encounter for closed fracture: Secondary | ICD-10-CM | POA: Diagnosis present

## 2021-06-21 DIAGNOSIS — N179 Acute kidney failure, unspecified: Secondary | ICD-10-CM | POA: Diagnosis present

## 2021-06-21 DIAGNOSIS — K219 Gastro-esophageal reflux disease without esophagitis: Secondary | ICD-10-CM | POA: Diagnosis present

## 2021-06-21 DIAGNOSIS — N1832 Chronic kidney disease, stage 3b: Secondary | ICD-10-CM | POA: Diagnosis not present

## 2021-06-21 DIAGNOSIS — M25559 Pain in unspecified hip: Secondary | ICD-10-CM | POA: Diagnosis not present

## 2021-06-21 DIAGNOSIS — M4848XA Fatigue fracture of vertebra, sacral and sacrococcygeal region, initial encounter for fracture: Secondary | ICD-10-CM | POA: Diagnosis not present

## 2021-06-21 DIAGNOSIS — Z743 Need for continuous supervision: Secondary | ICD-10-CM | POA: Diagnosis not present

## 2021-06-21 DIAGNOSIS — S32599A Other specified fracture of unspecified pubis, initial encounter for closed fracture: Secondary | ICD-10-CM | POA: Diagnosis not present

## 2021-06-21 DIAGNOSIS — Y92038 Other place in apartment as the place of occurrence of the external cause: Secondary | ICD-10-CM

## 2021-06-21 DIAGNOSIS — I482 Chronic atrial fibrillation, unspecified: Secondary | ICD-10-CM | POA: Diagnosis not present

## 2021-06-21 DIAGNOSIS — W010XXA Fall on same level from slipping, tripping and stumbling without subsequent striking against object, initial encounter: Secondary | ICD-10-CM | POA: Diagnosis present

## 2021-06-21 DIAGNOSIS — Y9301 Activity, walking, marching and hiking: Secondary | ICD-10-CM | POA: Diagnosis present

## 2021-06-21 DIAGNOSIS — Z90710 Acquired absence of both cervix and uterus: Secondary | ICD-10-CM

## 2021-06-21 DIAGNOSIS — S32509A Unspecified fracture of unspecified pubis, initial encounter for closed fracture: Secondary | ICD-10-CM | POA: Diagnosis not present

## 2021-06-21 DIAGNOSIS — F0390 Unspecified dementia without behavioral disturbance: Secondary | ICD-10-CM | POA: Diagnosis present

## 2021-06-21 DIAGNOSIS — M47816 Spondylosis without myelopathy or radiculopathy, lumbar region: Secondary | ICD-10-CM | POA: Diagnosis not present

## 2021-06-21 HISTORY — DX: Essential (primary) hypertension: I10

## 2021-06-21 LAB — CBC
HCT: 34.6 % — ABNORMAL LOW (ref 36.0–46.0)
Hemoglobin: 11.5 g/dL — ABNORMAL LOW (ref 12.0–15.0)
MCH: 33.4 pg (ref 26.0–34.0)
MCHC: 33.2 g/dL (ref 30.0–36.0)
MCV: 100.6 fL — ABNORMAL HIGH (ref 80.0–100.0)
Platelets: 268 10*3/uL (ref 150–400)
RBC: 3.44 MIL/uL — ABNORMAL LOW (ref 3.87–5.11)
RDW: 15 % (ref 11.5–15.5)
WBC: 9.1 10*3/uL (ref 4.0–10.5)
nRBC: 0 % (ref 0.0–0.2)

## 2021-06-21 LAB — BASIC METABOLIC PANEL
Anion gap: 9 (ref 5–15)
BUN: 38 mg/dL — ABNORMAL HIGH (ref 8–23)
CO2: 24 mmol/L (ref 22–32)
Calcium: 8.6 mg/dL — ABNORMAL LOW (ref 8.9–10.3)
Chloride: 106 mmol/L (ref 98–111)
Creatinine, Ser: 1.93 mg/dL — ABNORMAL HIGH (ref 0.44–1.00)
GFR, Estimated: 24 mL/min — ABNORMAL LOW (ref >60)
Glucose, Bld: 124 mg/dL — ABNORMAL HIGH (ref 70–99)
Potassium: 4.1 mmol/L (ref 3.5–5.1)
Sodium: 139 mmol/L (ref 135–145)

## 2021-06-21 LAB — RESP PANEL BY RT-PCR (FLU A&B, COVID) ARPGX2
Influenza A by PCR: NEGATIVE
Influenza B by PCR: NEGATIVE
SARS Coronavirus 2 by RT PCR: NEGATIVE

## 2021-06-21 IMAGING — DX DG HIP (WITH OR WITHOUT PELVIS) 2-3V*R*
3 series · 3 of 3 positions shown · non-contrast
Comparison: None available.

CLINICAL DATA: Fall, pain with walking in a [AGE] female.

EXAM:
DG HIP (WITH OR WITHOUT PELVIS) 2-3V RIGHT

[pelvis ap]
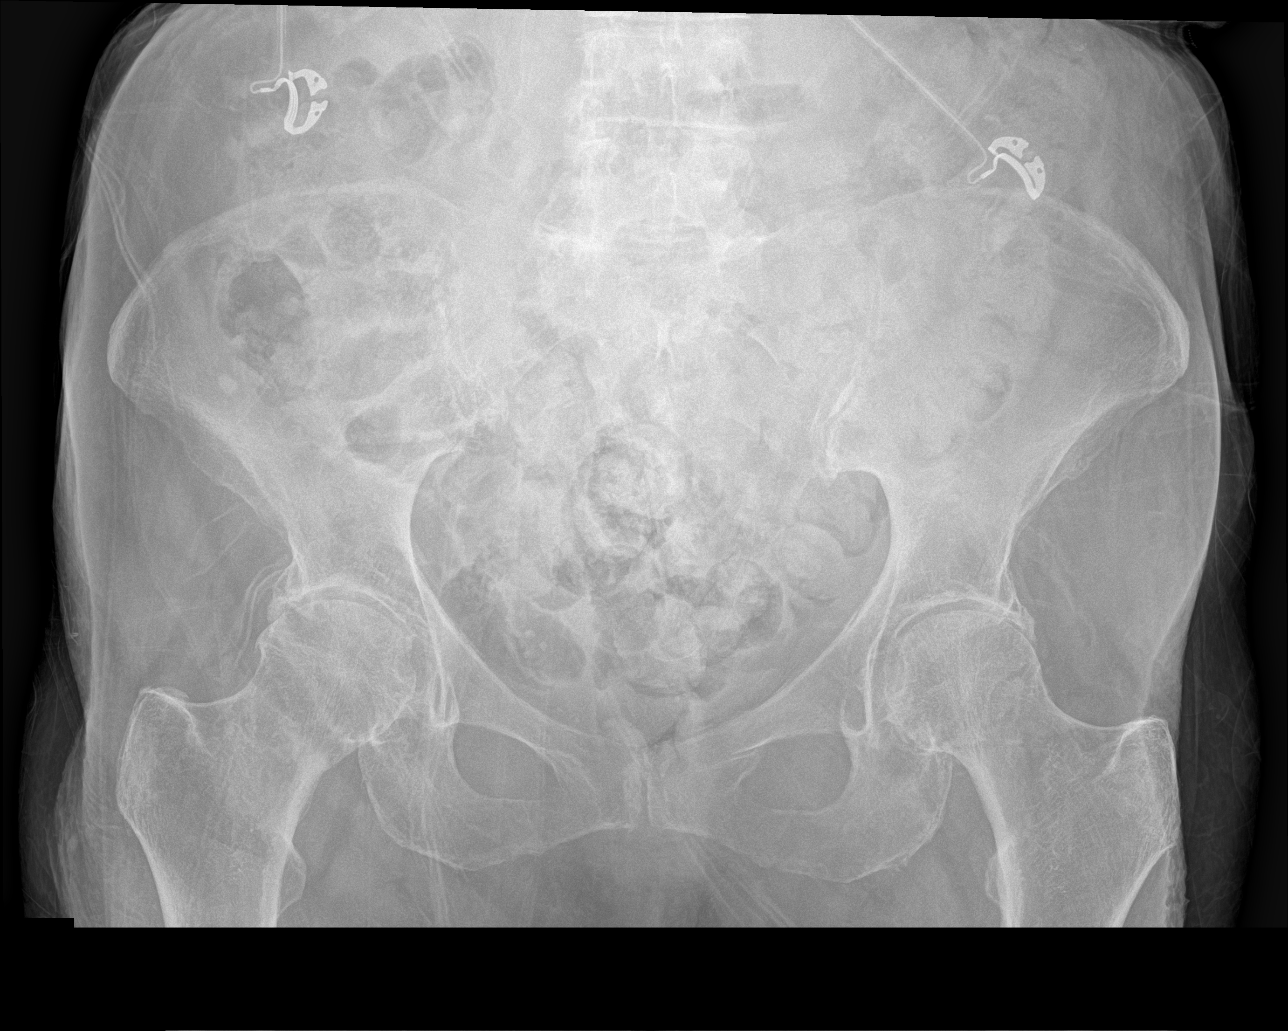

[hip ap]
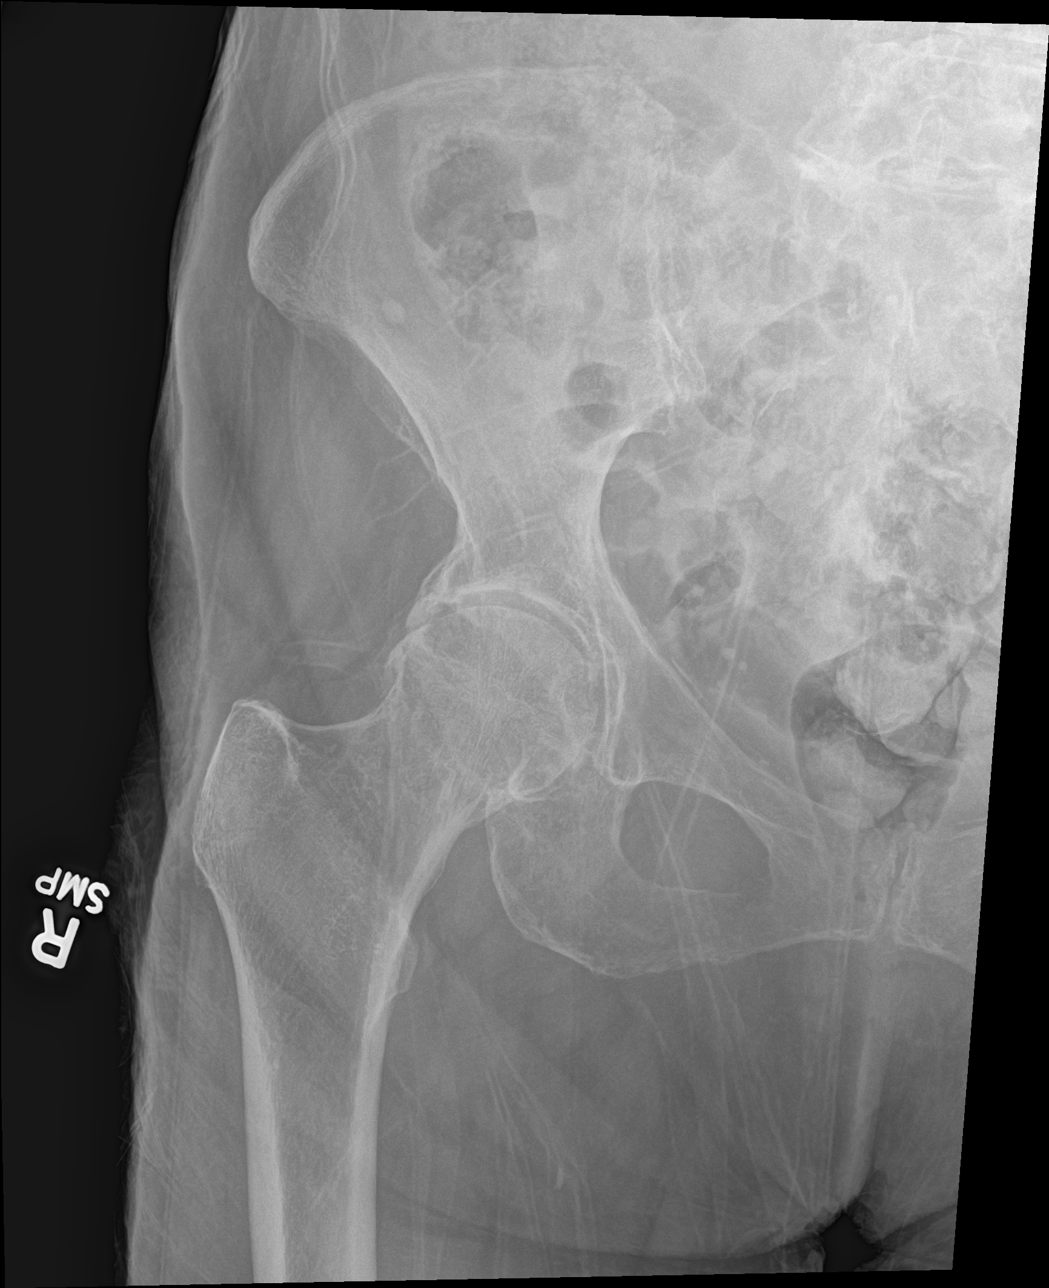

[hip lat]
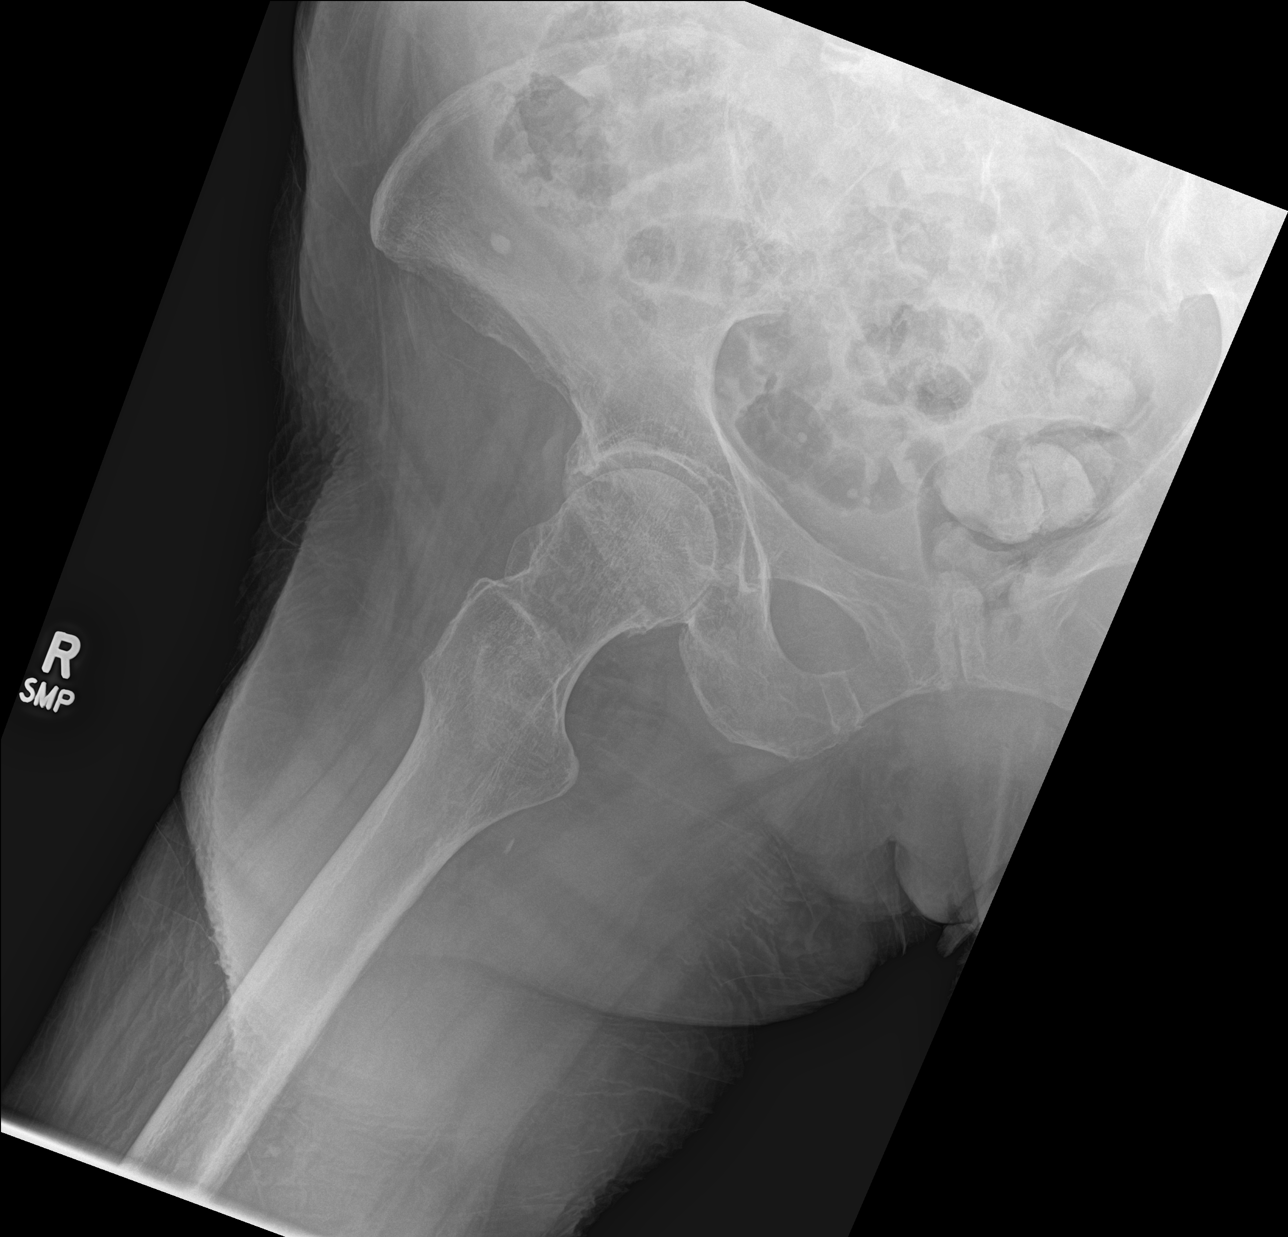

[3 of 3 positions shown; findings below may reference images not displayed]

FINDINGS: Spinal degenerative changes incidentally noted in the lower lumbar
spine. The patient is osteopenic. Stool and gas overlies the
bilateral iliac crests and is concentrated in the central pelvis
overlying much of the sacrum limiting assessment in these areas. No
sign of displaced fracture about the bony pelvis. Irregularity of
the RIGHT inferior pubic ramus.

Degenerative changes the bilateral hips.

Irregularity of the RIGHT femoral neck with a sclerotic band in the
mid RIGHT femoral neck on the AP projection. Mild irregularity at
the upper margin of the femoral head neck junction along the medial
aspect may be more related to ring of osteophytes but is of
uncertain significance, seen on lateral projection.
IMPRESSION: 1. No sign of displaced fracture about the bony pelvis.
2. Question of mildly impacted RIGHT femoral neck fracture versus
developing stress fracture/insufficiency fracture of the RIGHT
femoral neck.
3. Possible RIGHT inferior pubic ramus fracture.

## 2021-06-21 IMAGING — CT CT HEAD W/O CM
4 series · 15 of 47 positions shown, 17 images · non-contrast
Comparison: None.

CLINICAL DATA: Head trauma, minor (Age >= 65y); Neck trauma (Age >=
65y)



[Series 2: head w o · axial · 0.42mm/px · z∈[+208,+333]mm · 7 of 35 slices shown, 9 images]
[im 5/35  brain]
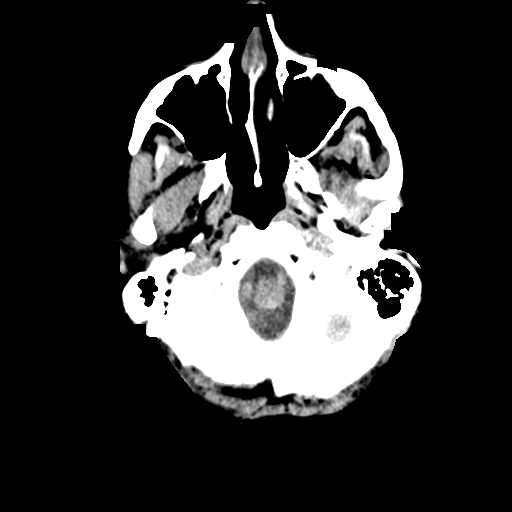
[im 5/35  bone]
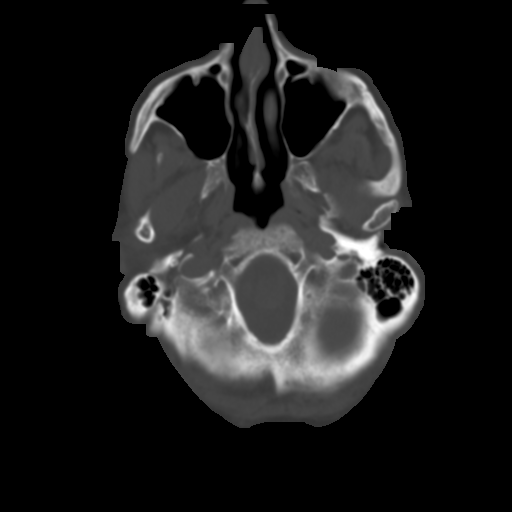
[im 9/35  brain]
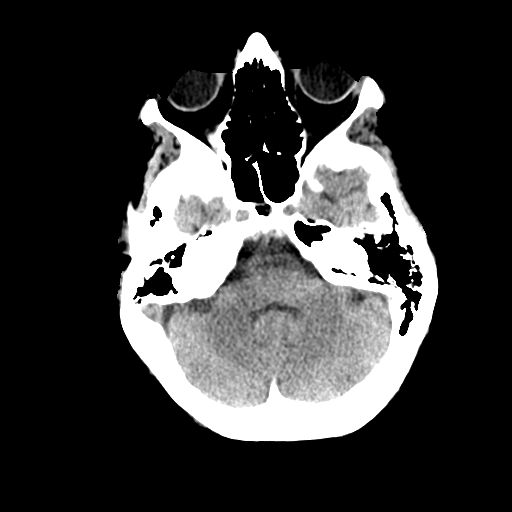
[im 13/35  brain]
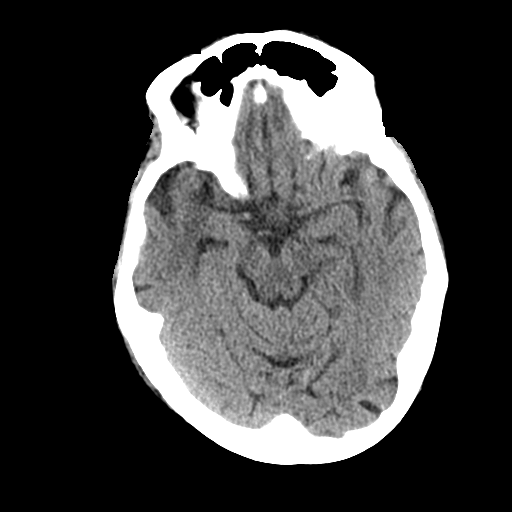
[im 18/35  brain]
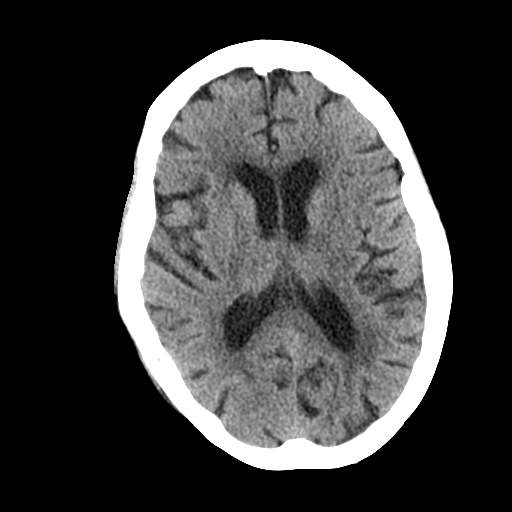
[im 22/35  brain]
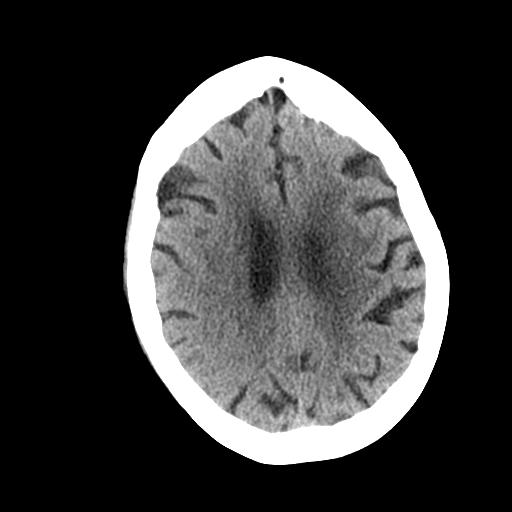
[im 22/35  bone]
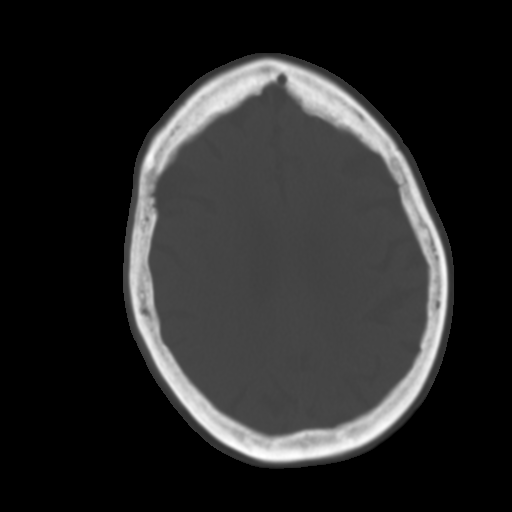
[im 26/35  brain]
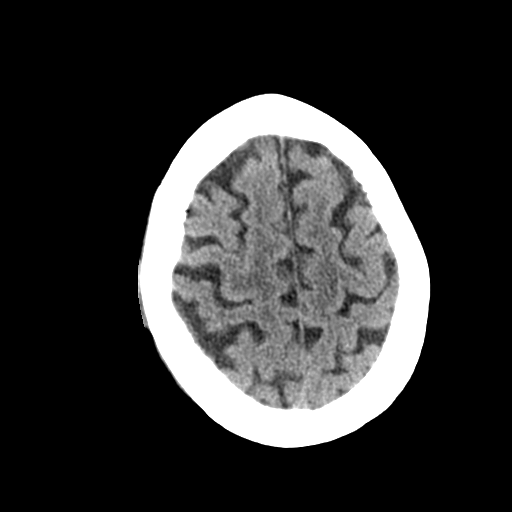
[im 30/35  brain]
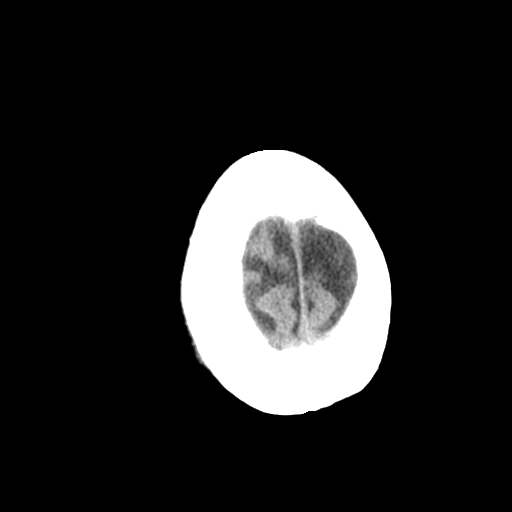

[Series 3: head bone · axial · 0.42mm/px · z∈[+204,+222]mm · 2 of 86 slices shown]
[im 9/86  bone]
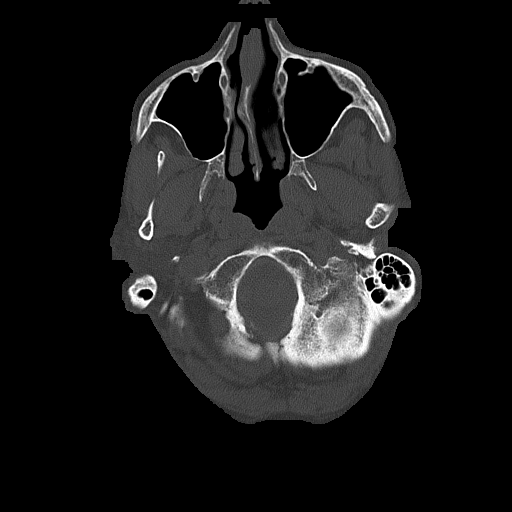
[im 18/86  bone]
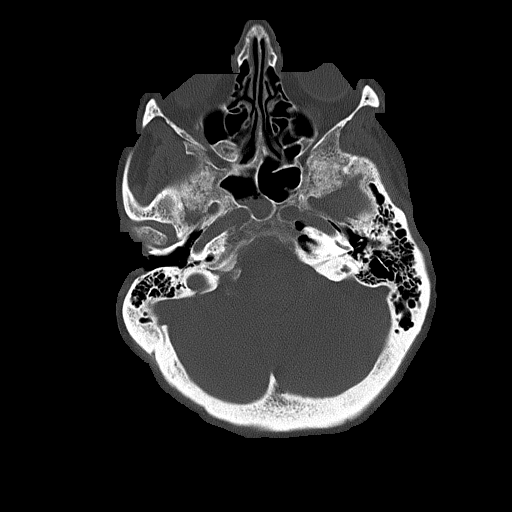

[Series 4: coronal soft · coronal · 0.36mm/px · 3 of 84 slices shown]
[im 28/84  brain]
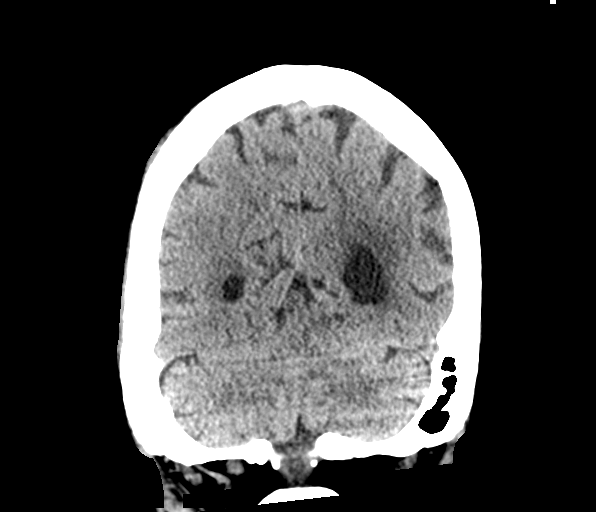
[im 37/84  brain]
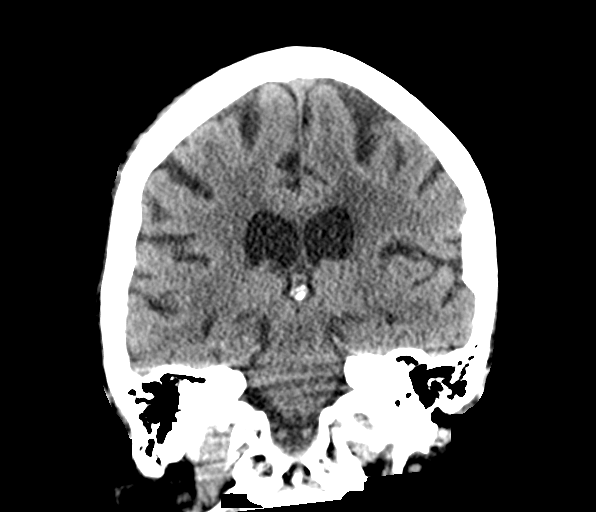
[im 47/84  brain]
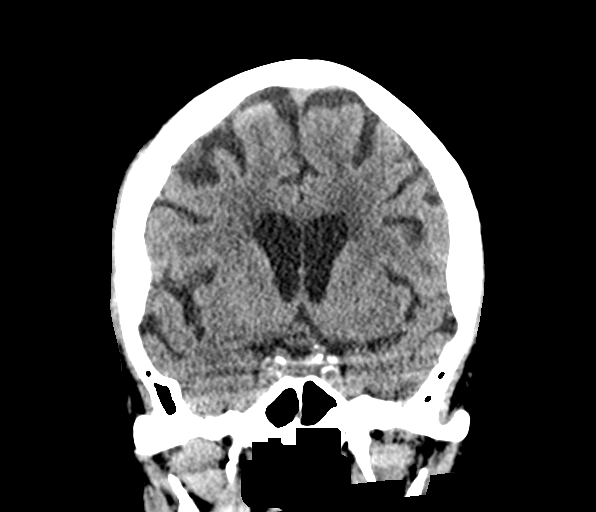

[Series 5: sagittal soft · sagittal · 0.37mm/px · 3 of 71 slices shown]
[im 24/71  brain]
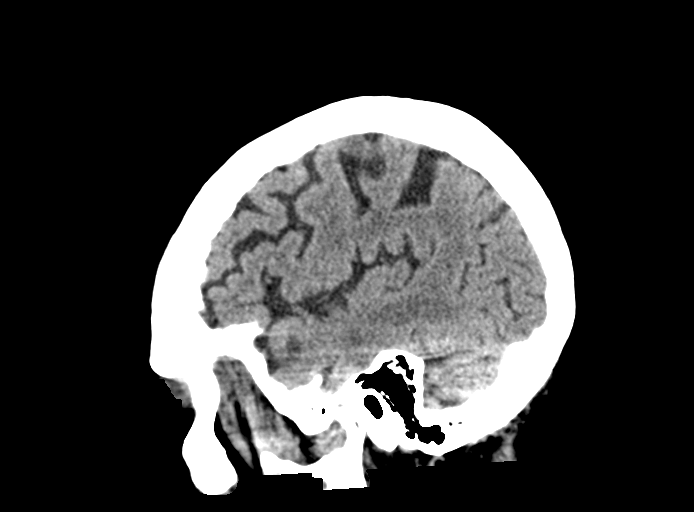
[im 36/71  brain]
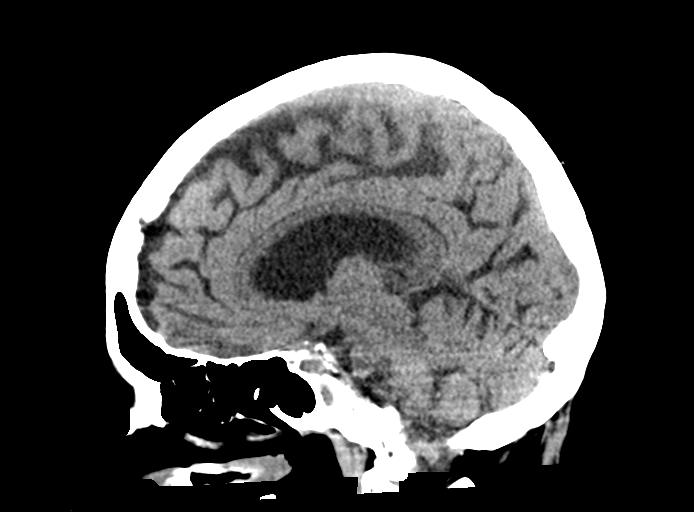
[im 47/71  brain]
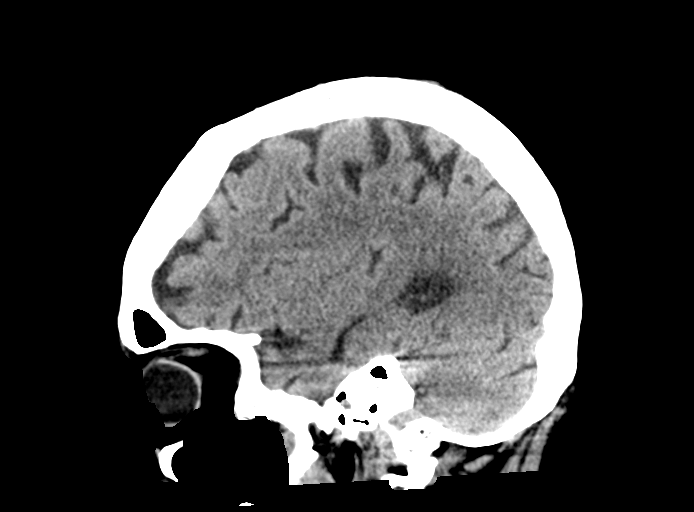

[15 of 47 positions shown; findings below may reference images not displayed]

FINDINGS: CT HEAD FINDINGS

Brain: No evidence of acute intracranial hemorrhage or extra-axial
collection.No evidence of mass lesion/concerning mass effect.The
ventricles are normal in size.Scattered subcortical and
periventricular white matter hypodensities, nonspecific but likely
sequela of chronic small vessel ischemic disease.Mild cerebral
atrophy

Vascular: No hyperdense vessel.

Skull: Negative for skull fracture.

Sinuses/Orbits: No acute finding.

Other: There is a small right-sided scalp hematoma.

CT CERVICAL SPINE FINDINGS

Alignment: Normal.

Skull base and vertebrae: There is no evidence of acute cervical
spine fracture. No aggressive osseous lesion.

Soft tissues and spinal canal: No prevertebral fluid or swelling. No
visible canal hematoma.

Disc levels: Multilevel degenerative disc disease, most severe at
C5-C6 and C6-C7. There is moderate severe multilevel facet
arthropathy throughout.

Upper chest: Negative

Other: None.
IMPRESSION: No acute intracranial abnormality. Small right-sided scalp hematoma.
Chronic small vessel ischemic disease.

No acute cervical spine fracture. Multilevel degenerative disc
disease and facet arthropathy.

## 2021-06-21 IMAGING — CT CT CERVICAL SPINE W/O CM
3 series · 13 of 33 positions shown, 16 images · non-contrast
Comparison: None.

CLINICAL DATA: Head trauma, minor (Age >= 65y); Neck trauma (Age >=
65y)



[Series 4: c spine soft · axial · 0.36mm/px · z∈[+86,+196]mm · 5 of 80 slices shown, 7 images]
[im 13/80  soft-tissue]
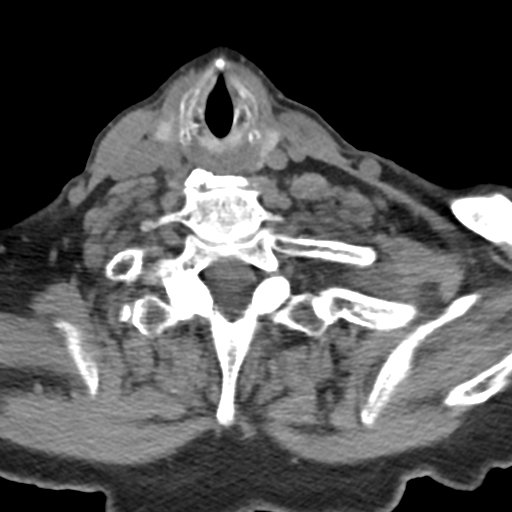
[im 13/80  bone]
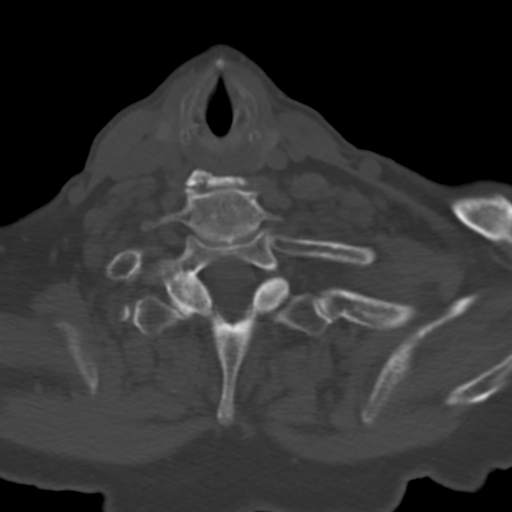
[im 25/80  bone]
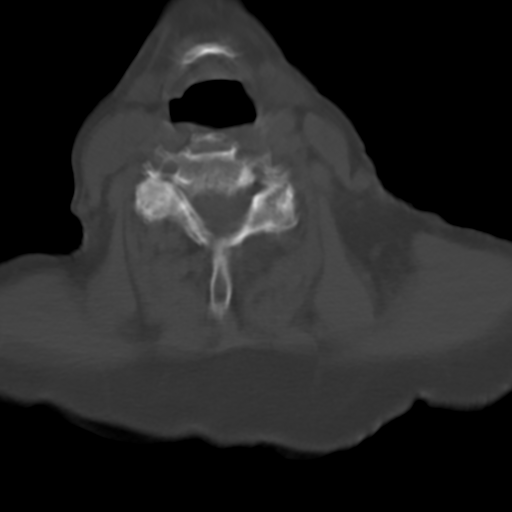
[im 43/80  bone]
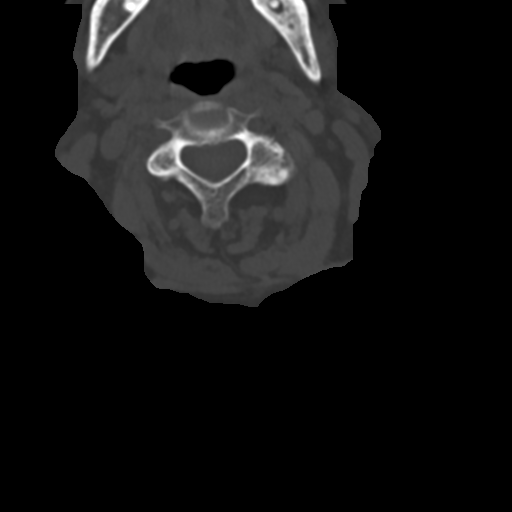
[im 55/80  bone]
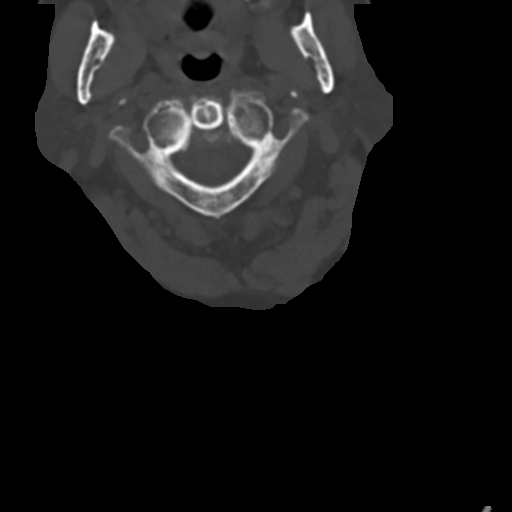
[im 67/80  soft-tissue]
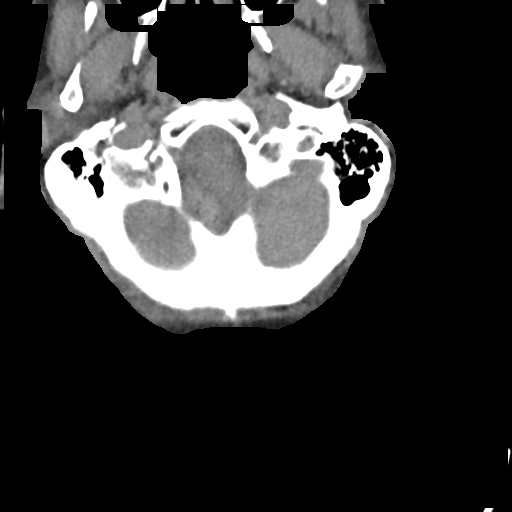
[im 67/80  bone]
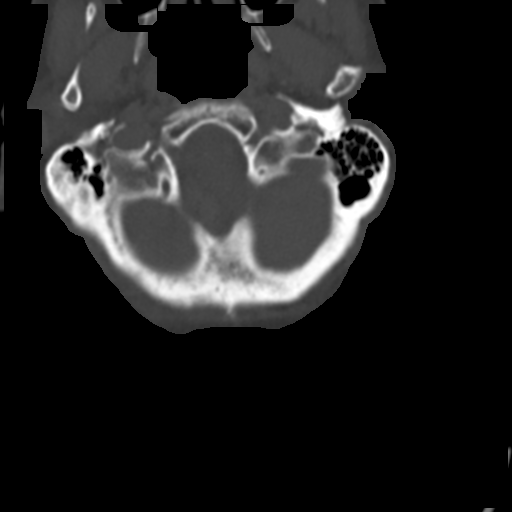

[Series 5: sagittal bone · sagittal · 0.35mm/px · 5 of 61 slices shown, 6 images]
[im 21/61  bone]
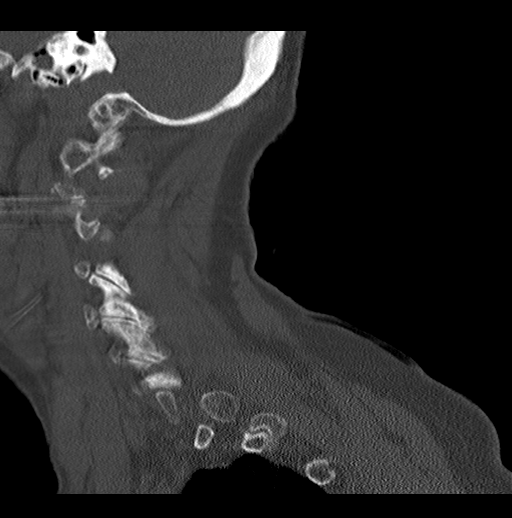
[im 26/61  bone]
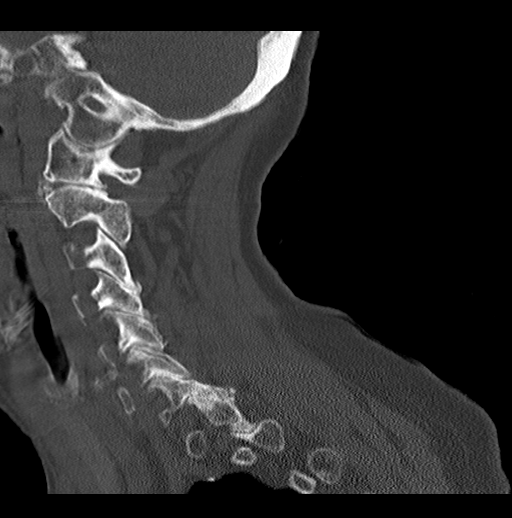
[im 31/61  soft-tissue]
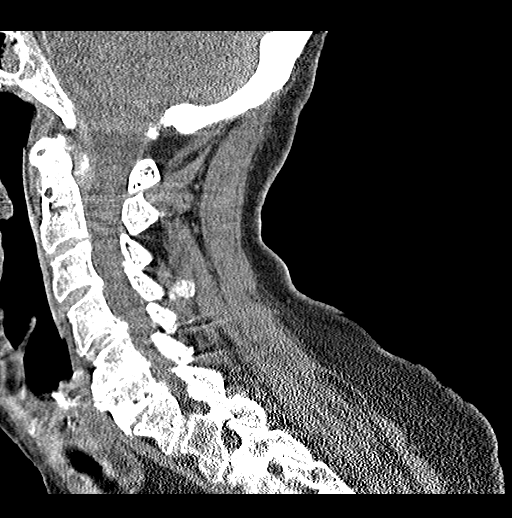
[im 31/61  bone]
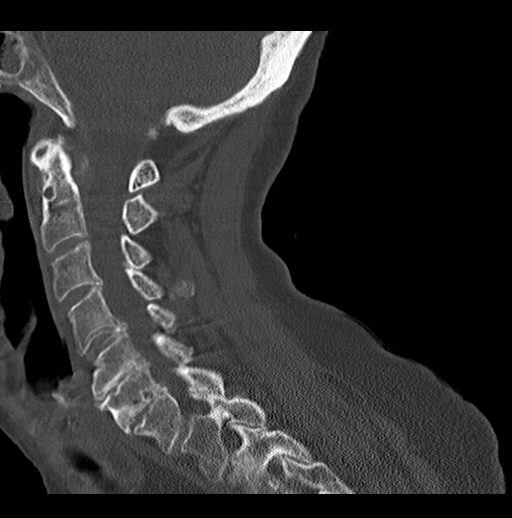
[im 36/61  bone]
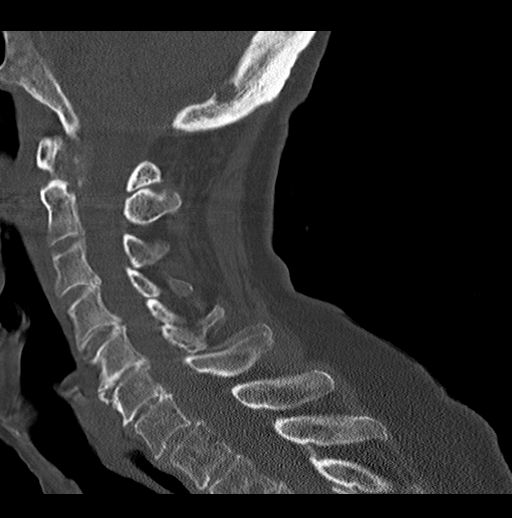
[im 41/61  bone]
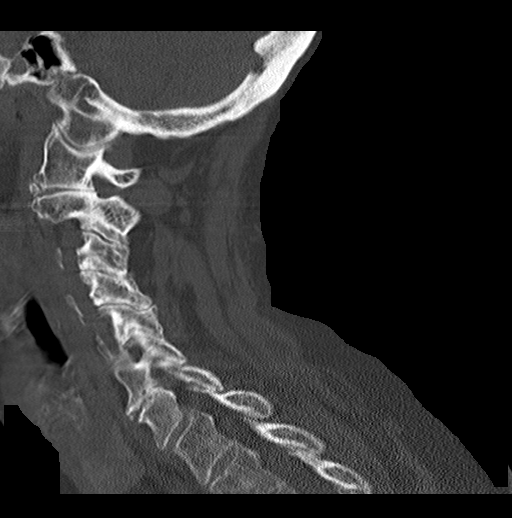

[Series 6: coronal bone · coronal · 0.23mm/px · 3 of 61 slices shown]
[im 13/61  bone]
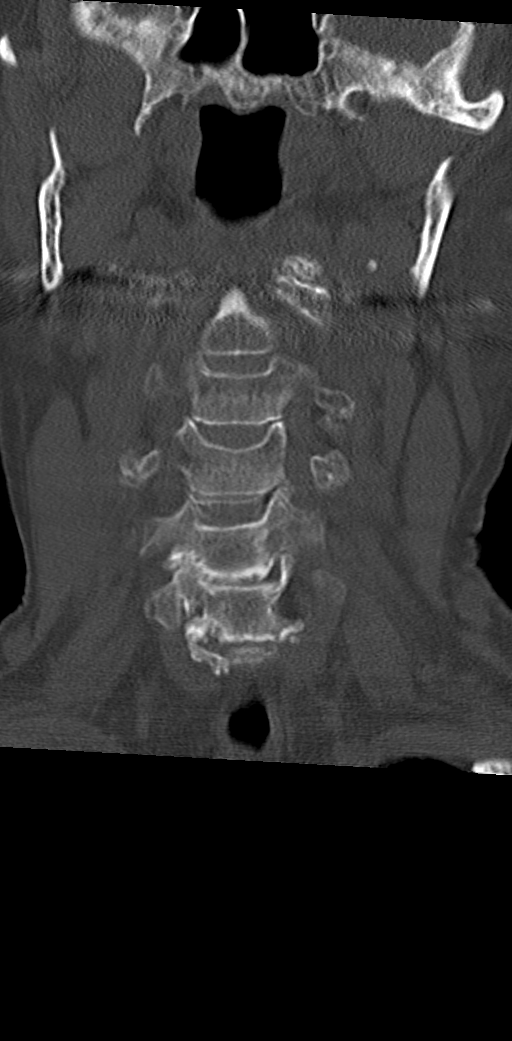
[im 25/61  bone]
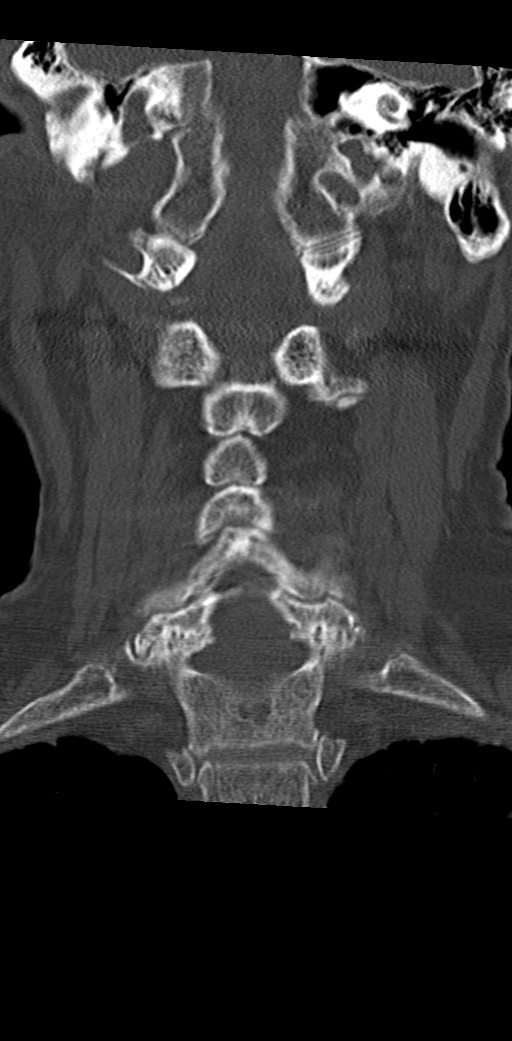
[im 37/61  bone]
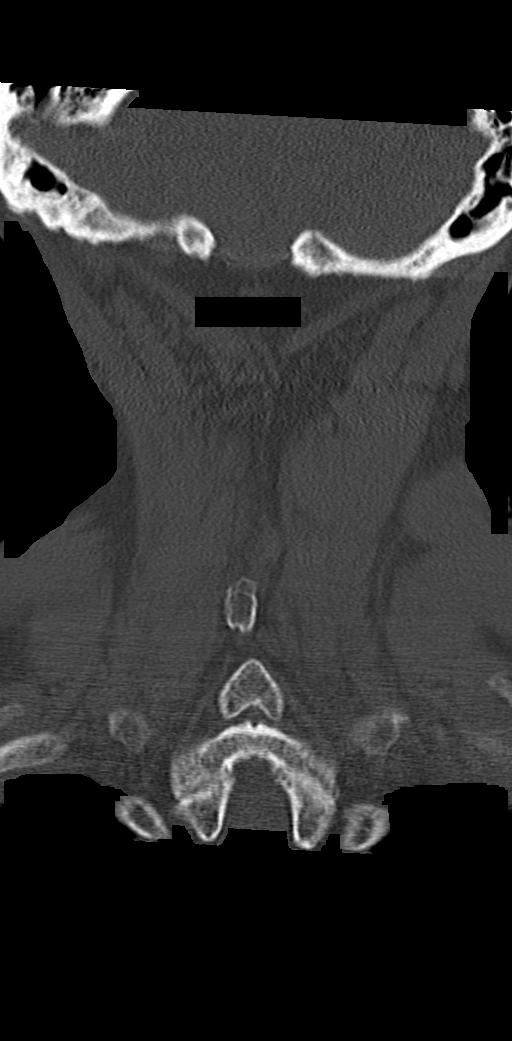

[13 of 33 positions shown; findings below may reference images not displayed]

FINDINGS: CT HEAD FINDINGS

Brain: No evidence of acute intracranial hemorrhage or extra-axial
collection.No evidence of mass lesion/concerning mass effect.The
ventricles are normal in size.Scattered subcortical and
periventricular white matter hypodensities, nonspecific but likely
sequela of chronic small vessel ischemic disease.Mild cerebral
atrophy

Vascular: No hyperdense vessel.

Skull: Negative for skull fracture.

Sinuses/Orbits: No acute finding.

Other: There is a small right-sided scalp hematoma.

CT CERVICAL SPINE FINDINGS

Alignment: Normal.

Skull base and vertebrae: There is no evidence of acute cervical
spine fracture. No aggressive osseous lesion.

Soft tissues and spinal canal: No prevertebral fluid or swelling. No
visible canal hematoma.

Disc levels: Multilevel degenerative disc disease, most severe at
C5-C6 and C6-C7. There is moderate severe multilevel facet
arthropathy throughout.

Upper chest: Negative

Other: None.
IMPRESSION: No acute intracranial abnormality. Small right-sided scalp hematoma.
Chronic small vessel ischemic disease.

No acute cervical spine fracture. Multilevel degenerative disc
disease and facet arthropathy.

## 2021-06-21 IMAGING — CT CT HIP*R* W/O CM
3 of 6 series · 5 of 16 positions shown, 6 images · non-contrast
Comparison: Right hip x-ray earlier the same day

CLINICAL DATA: Hip trauma, fracture suspected

EXAM:
CT OF THE RIGHT HIP WITHOUT CONTRAST
TECHNIQUE: Multidetector CT imaging of the right hip was performed according to
the standard protocol. Multiplanar CT image reconstructions were
also generated.
RADIATION DOSE REDUCTION: This exam was performed according to the
departmental dose-optimization program which includes automated
exposure control, adjustment of the mA and/or kV according to
patient size and/or use of iterative reconstruction technique.

[Series 13: 3 axial soft · axial · 0.49mm/px · z∈[-486,-310]mm · 3 of 89 slices shown, 4 images]
[im 1/89  soft-tissue]
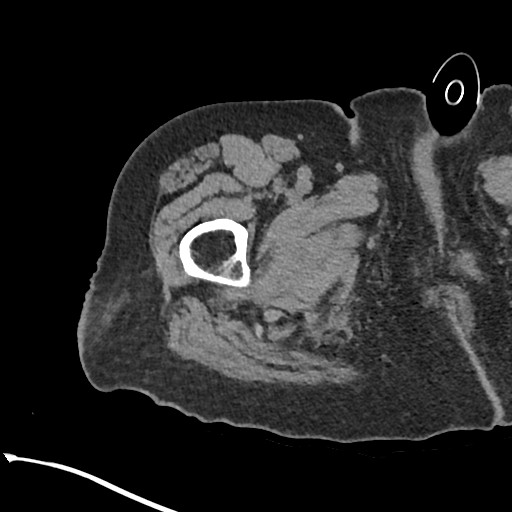
[im 1/89  bone]
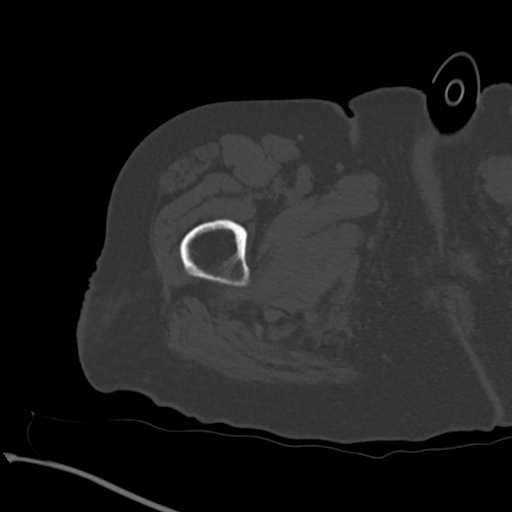
[im 45/89  soft-tissue]
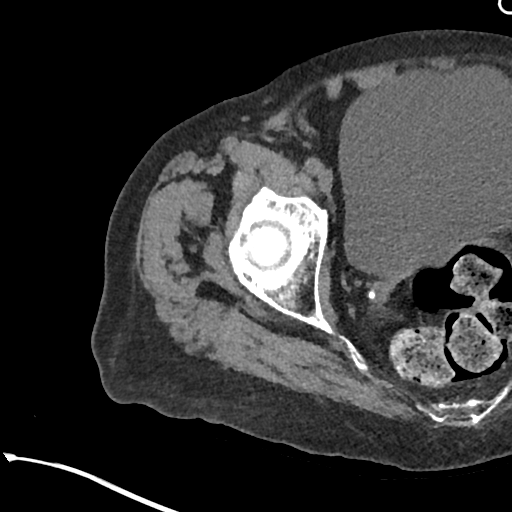
[im 89/89  soft-tissue]
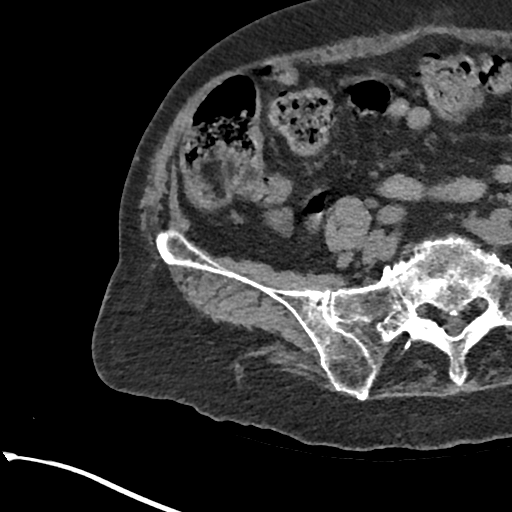

[Series 20: coronal st · coronal · 0.40mm/px · 1 of 115 slices shown]
[im 58/115  soft-tissue]
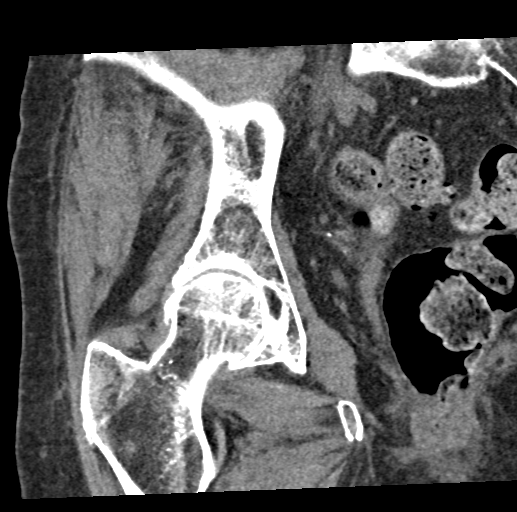

[Series 21: sagittal st · sagittal · 0.34mm/px · 1 of 121 slices shown]
[im 61/121  soft-tissue]
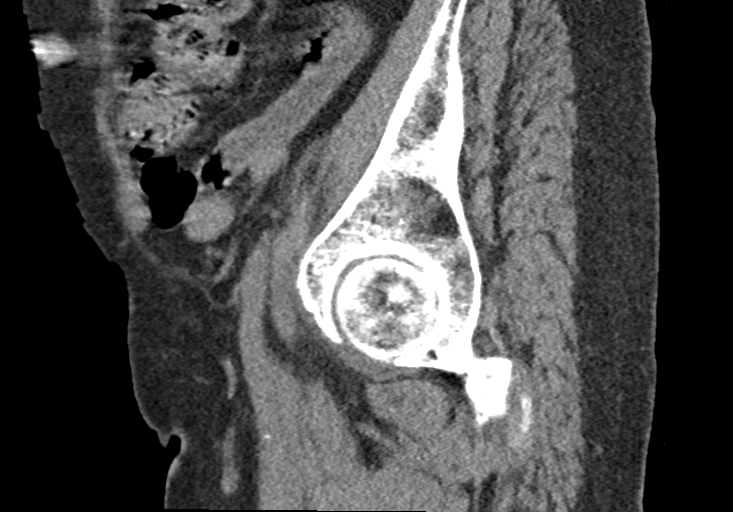

[5 of 16 positions shown; findings below may reference images not displayed]

FINDINGS: Bones/Joint/Cartilage

Acute nondisplaced slightly angulated fracture of the right inferior
pubic ramus. Acute nondisplaced fractures of the left superior pubic
ramus and parasymphyseal pubic bone. Likely subacute or acute
nondisplaced fracture of the right sacrum partially visualized
coursing parallel to the SI joint with mild surrounding sclerosis.
Bones are osteopenic. Mild-to-moderate degenerative changes of the
right hip with juxta-articular subchondral sclerosis, small cysts
and marginal osteophytes.

Ligaments

Suboptimally assessed by CT.

Muscles and Tendons

Suboptimally assessed by CT. No obvious acute abnormality
identified.

Soft tissues

Subcutaneous fat stranding/edema lateral to the greater trochanter.
Note is made of colonic diverticulosis.
IMPRESSION: 1. Acute nondisplaced fractures of the right inferior pubic ramus,
left superior pubic ramus and left parasymphyseal pubic bone.
2. Subacute or possibly acute fracture in the right sacrum, likely
insufficiency fracture.
3. Osteopenia and other findings as described.

## 2021-06-21 MED ORDER — MORPHINE SULFATE (PF) 2 MG/ML IV SOLN
2.0000 mg | Freq: Once | INTRAVENOUS | Status: AC
  Administered 2021-06-21: 2 mg via INTRAVENOUS
  Filled 2021-06-21: qty 1

## 2021-06-21 MED ORDER — METOPROLOL SUCCINATE ER 25 MG PO TB24
12.5000 mg | ORAL_TABLET | Freq: Every day | ORAL | Status: DC
Start: 2021-06-22 — End: 2021-06-23
  Administered 2021-06-22 – 2021-06-23 (×2): 12.5 mg via ORAL
  Filled 2021-06-21 (×3): qty 1

## 2021-06-21 MED ORDER — PANTOPRAZOLE SODIUM 40 MG PO TBEC
40.0000 mg | DELAYED_RELEASE_TABLET | Freq: Every day | ORAL | Status: DC
  Administered 2021-06-22 – 2021-06-23 (×2): 40 mg via ORAL
  Filled 2021-06-21 (×3): qty 1

## 2021-06-21 MED ORDER — ACETAMINOPHEN 650 MG RE SUPP: 650.0000 mg | Freq: Four times a day (QID) | RECTAL | Status: DC | PRN

## 2021-06-21 MED ORDER — TRAMADOL HCL 50 MG PO TABS
50.0000 mg | ORAL_TABLET | Freq: Four times a day (QID) | ORAL | Status: DC | PRN
  Administered 2021-06-22: 50 mg via ORAL
  Filled 2021-06-21: qty 1

## 2021-06-21 MED ORDER — ACETAMINOPHEN 325 MG PO TABS: 650.0000 mg | ORAL_TABLET | Freq: Four times a day (QID) | ORAL | Status: DC | PRN

## 2021-06-21 MED ORDER — HEPARIN SODIUM (PORCINE) 5000 UNIT/ML IJ SOLN
5000.0000 [IU] | Freq: Three times a day (TID) | INTRAMUSCULAR | Status: DC
  Administered 2021-06-22 – 2021-06-23 (×4): 5000 [IU] via SUBCUTANEOUS
  Filled 2021-06-21 (×4): qty 1

## 2021-06-21 MED ORDER — MORPHINE SULFATE (PF) 2 MG/ML IV SOLN: 1.0000 mg | INTRAVENOUS | Status: DC | PRN

## 2021-06-21 MED ORDER — AMLODIPINE BESYLATE 5 MG PO TABS
5.0000 mg | ORAL_TABLET | Freq: Every day | ORAL | Status: DC
  Administered 2021-06-22 – 2021-06-23 (×2): 5 mg via ORAL
  Filled 2021-06-21 (×3): qty 1

## 2021-06-21 MED ORDER — MORPHINE SULFATE (PF) 4 MG/ML IV SOLN: 4.0000 mg | Freq: Once | INTRAVENOUS | Status: DC

## 2021-06-21 MED ORDER — AMIODARONE HCL 200 MG PO TABS
200.0000 mg | ORAL_TABLET | Freq: Every day | ORAL | Status: DC
  Administered 2021-06-22 – 2021-06-23 (×2): 200 mg via ORAL
  Filled 2021-06-21 (×3): qty 1

## 2021-06-21 MED ORDER — SODIUM CHLORIDE 0.9 % IV SOLN: INTRAVENOUS | Status: AC

## 2021-06-21 MED ORDER — CALCIUM CARBONATE ANTACID 500 MG PO CHEW
1250.0000 mg | CHEWABLE_TABLET | Freq: Two times a day (BID) | ORAL | Status: DC
  Administered 2021-06-21 – 2021-06-23 (×4): 1250 mg via ORAL
  Filled 2021-06-21 (×4): qty 7

## 2021-06-21 NOTE — ED Triage Notes (Signed)
Pt brought in by [facility] after a fall yesterday and hitting head.  Pt is not on blood thinners.  Bruising noted to right back of head by facility.  Denies changes in mental status.  Reports pain in right hip.  No shortening or rotation noted by [facility].  Pt has been ambulatory since fall.  Alert and oriented. nad ?

## 2021-06-21 NOTE — ED Notes (Signed)
Patient transported to CT 

## 2021-06-21 NOTE — H&P (Signed)
? ?                                                                                                       TRH H&P ? ? Patient Demographics:  ? ? Paula Mason, is a 88 y.o. female  MRN: [id number]   DOB - 1928/06/08 ? ?Admit Date - 06/21/2021 ? ?Outpatient Primary MD for the patient is Monico Blitz, MD ? ?Referring MD/NP/PA: Dr Tomi Bamberger ? ?Patient coming from: [facility] ? ?Chief Complaint  ?Patient presents with  ? Fall  ?  ? ? HPI:  ? ? Paula Mason  is a 88 y.o. female, with past medical history of paroxysmal A-fib, not on anticoagulation due to perforated duodenal ulcer status postsurgical repair in May 2021, hypertension, GERD, patient presents to ED secondary to complaints of fall, pain, patient lives at a retirement facility, she usually ambulates with a walker, she was walking to the dining hall yesterday, without her walker, she remembered that she left the walker at her room, and she started to go back to her room when she ended up stumbling and falling, she did hit the back/right side of her head, and started to have pain in her hips as well, she denies dizziness, lightheadedness, loss of consciousness, any focal deficits around her fall, she was trying to ambulate today with pain, as well she noted bruising on the back of her head which prompted her to come to ED for evaluation. ?-ED her work-up significant for creatinine of 1.9, baseline is 1.5-1.6, globin of 11.5, she will hip x-ray with suspicion of trochanteric fracture, so CT pelvis was obtained, with no evidence of hip fracture, but significant for pelvic fracture 3 places including right inferior pubic ramus, left superior pubic ramus, and left parasymphyseal pubic bone, and insufficiency fracture and right sacrum as well, [facility] hospitalist consulted to admit. ? ? ? Review of systems:  ?  ?In addition to the HPI above,  ? ? ?A full 10 point Review of Systems was done, except as stated above, all other Review of Systems were  negative. ? ? ?With Past History of the following :  ? ? ?Past Medical History:  ?Diagnosis Date  ? Arthritis   ? GERD (gastroesophageal reflux disease)   ? Hypertension   ?   ? ?Past Surgical History:  ?Procedure Laterality Date  ? ABDOMINAL HYSTERECTOMY    ? [place]  ? BREAST SURGERY Left   ? benign  ? CATARACT EXTRACTION W/PHACO Right 08/03/2013  ? Procedure: CATARACT EXTRACTION PHACO AND INTRAOCULAR LENS PLACEMENT (IOC);  Surgeon: Tonny Branch, MD;  Location: [facility];  Service: Ophthalmology;  Laterality: Right;  CDE 13.72  ? CHOLECYSTECTOMY    ? ? ? ? Social History:  ? ?  ?Social History  ? ?Tobacco Use  ? Smoking status: Never  ? Smokeless tobacco: Never  ?Substance Use Topics  ? Alcohol use: No  ?  ? ?Lives -[facility] ? ?Mobility -relates with a walker ? ? ? ? Family History :  ? ? History reviewed. No pertinent  family history. ? ? ? Home Medications:  ? ?Prior to Admission medications   ?Medication Sig Start Date End Date Taking? Authorizing Provider  ?amiodarone (PACERONE) 200 MG tablet Take 200 mg by mouth daily. 06/02/21  Yes [provider]  ?amLODipine (NORVASC) 5 MG tablet Take 5 mg by mouth daily. 06/06/21  Yes [provider]  ?losartan (COZAAR) 25 MG tablet Take 25 mg by mouth daily. 06/06/21  Yes [provider]  ?meloxicam (MOBIC) 15 MG tablet Take 15 mg by mouth daily. 06/02/21  Yes [provider]  ?metoprolol succinate (TOPROL-XL) 25 MG 24 hr tablet Take 12.5 mg by mouth daily. 06/02/21  Yes [provider]  ?pantoprazole (PROTONIX) 40 MG tablet Take 40 mg by mouth daily.   Yes [provider]  ?potassium chloride (MICRO-K) 10 MEQ CR capsule Take 10 mEq by mouth daily. 06/02/21  Yes [provider]  ?traMADol (ULTRAM) 50 MG tablet Take 50 mg by mouth 2 (two) times daily as needed. 06/02/21  Yes [provider]  ?Multiple Vitamins-Minerals (CENTRUM SILVER ADULT 50+ PO) Take 1 tablet by mouth daily.    [provider]  ? ? ? Allergies:  ? ? No Known Allergies ? ? Physical Exam:  ? ?Vitals ? ?Blood pressure (!) 145/66, pulse 71, temperature 97.6 ?F (36.4 ?C), resp. rate 19, height 5\' 3"  (1.6 m), weight 54.9 kg, SpO2 90 %. ? ? ?1. General frail elderly female, laying in bed ? ?2. Awake Alert, Oriented X 3.  Appropriate, answering questions appropriately, but she has minimal diminished insight ? ?3. No F.N deficits, ALL C.Nerves Intact, Strength 5/5 all 4 extremities, Sensation intact all 4 extremities, Plantars down going. ? ?4. Ears and Eyes appear Normal, Conjunctivae clear, PERRLA. Moist Oral Mucosa. ? ?5. Supple Neck, No JVD, No cervical lymphadenopathy appriciated, No Carotid Bruits. ? ?6. Symmetrical Chest wall movement, Good air movement bilaterally, CTAB. ? ?7. RRR, No Gallops, Rubs or Murmurs, No Parasternal Heave. ? ?8. Positive Bowel Sounds, Abdomen Soft, No tenderness, No organomegaly appriciated,No rebound -guarding or rigidity. ? ?9.  No Cyanosis, Normal Skin Turgor, No Skin Rash or Bruise. ? ?10. Good muscle tone,  joints appear normal , no effusions, I have deferred for now range of motion exam of her pelvis and bilateral hips secondary to pain. ? ?11. No Palpable Lymph Nodes in Neck or Axillae ? ? ? Data Review:  ? ? CBC ?Recent Labs  ?Lab 06/21/21 ?1250  ?WBC 9.1  ?HGB 11.5*  ?HCT 34.6*  ?PLT 268  ?MCV 100.6*  ?MCH 33.4  ?MCHC 33.2  ?RDW 15.0  ? ?------------------------------------------------------------------------------------------------------------------ ? ?Chemistries  ?Recent Labs  ?Lab 06/21/21 ?1250  ?NA 139  ?K 4.1  ?CL 106  ?CO2 24  ?GLUCOSE 124*  ?BUN 38*  ?CREATININE 1.93*  ?CALCIUM 8.6*  ? ?------------------------------------------------------------------------------------------------------------------ ?estimated creatinine clearance is 16 mL/min (A) (by C-G formula based on SCr of 1.93 mg/dL  (H)). ?------------------------------------------------------------------------------------------------------------------ ?No results for input(s): TSH, T4TOTAL, T3FREE, THYROIDAB in the last 72 hours. ? ?Invalid input(s): FREET3 ? ?Coagulation profile ?No results for input(s): INR, PROTIME in the last 168 hours. ?------------------------------------------------------------------------------------------------------------------- ?No results for input(s): DDIMER in the last 72 hours. ?------------------------------------------------------------------------------------------------------------------- ? ?Cardiac Enzymes ?No results for input(s): CKMB, TROPONINI, MYOGLOBIN in the last 168 hours. ? ?Invalid input(s): CK ?------------------------------------------------------------------------------------------------------------------ ?No results found for: BNP ? ? ?--------------------------------------------------------------------------------------------------------------- ? ?Urinalysis ?No results found for: COLORURINE, APPEARANCEUR, [place], [place], [place], [place], [place], [place], PROTEINUR, UROBILINOGEN, NITRITE, LEUKOCYTESUR ? ?---------------------------------------------------------------------------------------------------------------- ? ? Imaging  Results:  ? ? CT Head Wo Contrast ? ?Result Date: 06/21/2021 ?CLINICAL DATA:  Head trauma, minor (Age >= 65y); Neck trauma (Age >= 65y) EXAM: CT HEAD WITHOUT CONTRAST CT CERVICAL SPINE WITHOUT CONTRAST TECHNIQUE: Multidetector CT imaging of the head and cervical spine was performed following the standard protocol without intravenous contrast. Multiplanar CT image reconstructions of the cervical spine were also generated. RADIATION DOSE REDUCTION: This exam was performed according to the departmental dose-optimization program which includes automated exposure control, adjustment of the mA and/or kV according to patient size and/or use of iterative reconstruction  technique. COMPARISON:  None. FINDINGS: CT HEAD FINDINGS Brain: No evidence of acute intracranial hemorrhage or extra-axial collection.No evidence of mass lesion/concerning mass effect.The ventricles are normal in size.Scattered subcortical and periventricular white matter hypodensities, nonspecific but li

## 2021-06-21 NOTE — ED Provider Notes (Signed)
?[facility] ?Provider Note ? ? ?CSN: [id number] ?Arrival date & time: 06/21/21  1033 ? ?  ? ?History ? ?Chief Complaint  ?Patient presents with  ?? Fall  ? ? ?Paula Mason is a 88 y.o. female. ? ? ?Fall ? ? ?Patient presents ED for evaluation after fall.  Patient lives at a retirement facility.  Patient was walking to the dining hall yesterday without her walker.  She remembered that she left it started to go back to get it when she ended up stumbling and falling.  Patient hit the back of her head and since that time she has been having pain in her hip.  She is able to walk but not as much as usual.  She denies any loss of consciousness.  She did not have any preceding dizziness before the fall.  She has noticed an area of bruising on the back of her head and was brought in today for evaluation ? ?Home Medications ?Prior to Admission medications   ?Medication Sig Start Date End Date Taking? Authorizing Provider  ?amiodarone (PACERONE) 200 MG tablet Take 200 mg by mouth daily. 06/02/21  Yes [provider]  ?amLODipine (NORVASC) 5 MG tablet Take 5 mg by mouth daily. 06/06/21  Yes [provider]  ?losartan (COZAAR) 25 MG tablet Take 25 mg by mouth daily. 06/06/21  Yes [provider]  ?meloxicam (MOBIC) 15 MG tablet Take 15 mg by mouth daily. 06/02/21  Yes [provider]  ?metoprolol succinate (TOPROL-XL) 25 MG 24 hr tablet Take 12.5 mg by mouth daily. 06/02/21  Yes [provider]  ?pantoprazole (PROTONIX) 40 MG tablet Take 40 mg by mouth daily.   Yes [provider]  ?potassium chloride (MICRO-K) 10 MEQ CR capsule Take 10 mEq by mouth daily. 06/02/21  Yes [provider]  ?traMADol (ULTRAM) 50 MG tablet Take 50 mg by mouth 2 (two) times daily as needed. 06/02/21  Yes [provider]  ?Multiple Vitamins-Minerals (CENTRUM SILVER ADULT 50+ PO) Take 1 tablet by mouth daily.    [provider]  ?   ? ?Allergies    ?Patient  has no known allergies.   ? ?Review of Systems   ?Review of Systems  ?Constitutional:  Negative for fever.  ? ?Physical Exam ?Updated Vital Signs ?BP (!) 145/66   Pulse 71   Temp 97.6 ?F (36.4 ?C)   Resp 19   Ht 1.6 m (5\' 3" )   Wt 54.9 kg   SpO2 90%   BMI 21.43 kg/m?  ?Physical Exam ?Vitals and nursing note reviewed.  ?Constitutional:   ?   General: She is not in acute distress. ?   Appearance: She is well-developed.  ?HENT:  ?   Head: Normocephalic and atraumatic.  ?   Right Ear: External ear normal.  ?   Left Ear: External ear normal.  ?Eyes:  ?   General: No scleral icterus.    ?   Right eye: No discharge.     ?   Left eye: No discharge.  ?   Conjunctiva/sclera: Conjunctivae normal.  ?Neck:  ?   Trachea: No tracheal deviation.  ?Cardiovascular:  ?   Rate and Rhythm: Normal rate and regular rhythm.  ?Pulmonary:  ?   Effort: Pulmonary effort is normal. No respiratory distress.  ?   Breath sounds: Normal breath sounds. No stridor. No wheezing or rales.  ?Abdominal:  ?   General: Bowel sounds are normal. There is no distension.  ?  Palpations: Abdomen is soft.  ?   Tenderness: There is no abdominal tenderness. There is no guarding or rebound.  ?Musculoskeletal:     ?   General: Tenderness present. No deformity.  ?   Right shoulder: Normal.  ?   Left shoulder: Normal.  ?   Right wrist: Normal.  ?   Left wrist: Normal.  ?   Cervical back: Normal and neck supple.  ?   Thoracic back: Normal.  ?   Lumbar back: Normal.  ?   Right hip: Tenderness present.  ?Skin: ?   General: Skin is warm and dry.  ?   Findings: No rash.  ?Neurological:  ?   General: No focal deficit present.  ?   Mental Status: She is alert.  ?   Cranial Nerves: No cranial nerve deficit (no facial droop, extraocular movements intact, no slurred speech).  ?   Sensory: No sensory deficit.  ?   Motor: No abnormal muscle tone or seizure activity.  ?   Coordination: Coordination normal.  ?Psychiatric:     ?   Mood and Affect: Mood normal.  ? ? ?ED  Results / Procedures / Treatments   ?Labs ?(all labs ordered are listed, but only abnormal results are displayed) ?Labs Reviewed  ?CBC - Abnormal; Notable for the following components:  ?    Result Value  ? RBC 3.44 (*)   ? Hemoglobin 11.5 (*)   ? HCT 34.6 (*)   ? MCV 100.6 (*)   ? All other components within normal limits  ?BASIC METABOLIC PANEL - Abnormal; Notable for the following components:  ? Glucose, Bld 124 (*)   ? BUN 38 (*)   ? Creatinine, Ser 1.93 (*)   ? Calcium 8.6 (*)   ? GFR, Estimated 24 (*)   ? All other components within normal limits  ? ? ?EKG ?None ? ?Radiology ?CT Head Wo Contrast ? ?Result Date: 06/21/2021 ?CLINICAL DATA:  Head trauma, minor (Age >= 65y); Neck trauma (Age >= 65y) EXAM: CT HEAD WITHOUT CONTRAST CT CERVICAL SPINE WITHOUT CONTRAST TECHNIQUE: Multidetector CT imaging of the head and cervical spine was performed following the standard protocol without intravenous contrast. Multiplanar CT image reconstructions of the cervical spine were also generated. RADIATION DOSE REDUCTION: This exam was performed according to the departmental dose-optimization program which includes automated exposure control, adjustment of the mA and/or kV according to patient size and/or use of iterative reconstruction technique. COMPARISON:  None. FINDINGS: CT HEAD FINDINGS Brain: No evidence of acute intracranial hemorrhage or extra-axial collection.No evidence of mass lesion/concerning mass effect.The ventricles are normal in size.Scattered subcortical and periventricular white matter hypodensities, nonspecific but likely sequela of chronic small vessel ischemic disease.Mild cerebral atrophy Vascular: No hyperdense vessel. Skull: Negative for skull fracture. Sinuses/Orbits: No acute finding. Other: There is a small right-sided scalp hematoma. CT CERVICAL SPINE FINDINGS Alignment: Normal. Skull base and vertebrae: There is no evidence of acute cervical spine fracture. No aggressive osseous lesion. Soft  tissues and spinal canal: No prevertebral fluid or swelling. No visible canal hematoma. Disc levels: Multilevel degenerative disc disease, most severe at C5-C6 and C6-C7. There is moderate severe multilevel facet arthropathy throughout. Upper chest: Negative Other: None. IMPRESSION: No acute intracranial abnormality. Small right-sided scalp hematoma. Chronic small vessel ischemic disease. No acute cervical spine fracture. Multilevel degenerative disc disease and facet arthropathy. Electronically Signed   By: Maurine Simmering M.D.   On: 06/21/2021 15:05  ? ?CT Cervical Spine Wo Contrast ? ?Result  Date: 06/21/2021 ?CLINICAL DATA:  Head trauma, minor (Age >= 65y); Neck trauma (Age >= 65y) EXAM: CT HEAD WITHOUT CONTRAST CT CERVICAL SPINE WITHOUT CONTRAST TECHNIQUE: Multidetector CT imaging of the head and cervical spine was performed following the standard protocol without intravenous contrast. Multiplanar CT image reconstructions of the cervical spine were also generated. RADIATION DOSE REDUCTION: This exam was performed according to the departmental dose-optimization program which includes automated exposure control, adjustment of the mA and/or kV according to patient size and/or use of iterative reconstruction technique. COMPARISON:  None. FINDINGS: CT HEAD FINDINGS Brain: No evidence of acute intracranial hemorrhage or extra-axial collection.No evidence of mass lesion/concerning mass effect.The ventricles are normal in size.Scattered subcortical and periventricular white matter hypodensities, nonspecific but likely sequela of chronic small vessel ischemic disease.Mild cerebral atrophy Vascular: No hyperdense vessel. Skull: Negative for skull fracture. Sinuses/Orbits: No acute finding. Other: There is a small right-sided scalp hematoma. CT CERVICAL SPINE FINDINGS Alignment: Normal. Skull base and vertebrae: There is no evidence of acute cervical spine fracture. No aggressive osseous lesion. Soft tissues and spinal canal:  No prevertebral fluid or swelling. No visible canal hematoma. Disc levels: Multilevel degenerative disc disease, most severe at C5-C6 and C6-C7. There is moderate severe multilevel facet arthropathy throughout. Upper c

## 2021-06-22 DIAGNOSIS — I480 Paroxysmal atrial fibrillation: Secondary | ICD-10-CM | POA: Diagnosis not present

## 2021-06-22 DIAGNOSIS — W010XXA Fall on same level from slipping, tripping and stumbling without subsequent striking against object, initial encounter: Secondary | ICD-10-CM | POA: Diagnosis present

## 2021-06-22 DIAGNOSIS — M6281 Muscle weakness (generalized): Secondary | ICD-10-CM | POA: Diagnosis not present

## 2021-06-22 DIAGNOSIS — Z9049 Acquired absence of other specified parts of digestive tract: Secondary | ICD-10-CM | POA: Diagnosis not present

## 2021-06-22 DIAGNOSIS — S32501D Unspecified fracture of right pubis, subsequent encounter for fracture with routine healing: Secondary | ICD-10-CM | POA: Diagnosis not present

## 2021-06-22 DIAGNOSIS — I482 Chronic atrial fibrillation, unspecified: Secondary | ICD-10-CM | POA: Diagnosis present

## 2021-06-22 DIAGNOSIS — S32309A Unspecified fracture of unspecified ilium, initial encounter for closed fracture: Secondary | ICD-10-CM | POA: Diagnosis not present

## 2021-06-22 DIAGNOSIS — R279 Unspecified lack of coordination: Secondary | ICD-10-CM | POA: Diagnosis not present

## 2021-06-22 DIAGNOSIS — S329XXA Fracture of unspecified parts of lumbosacral spine and pelvis, initial encounter for closed fracture: Secondary | ICD-10-CM | POA: Diagnosis present

## 2021-06-22 DIAGNOSIS — K279 Peptic ulcer, site unspecified, unspecified as acute or chronic, without hemorrhage or perforation: Secondary | ICD-10-CM | POA: Diagnosis not present

## 2021-06-22 DIAGNOSIS — N179 Acute kidney failure, unspecified: Secondary | ICD-10-CM | POA: Diagnosis present

## 2021-06-22 DIAGNOSIS — N189 Chronic kidney disease, unspecified: Secondary | ICD-10-CM | POA: Diagnosis not present

## 2021-06-22 DIAGNOSIS — I1310 Hypertensive heart and chronic kidney disease without heart failure, with stage 1 through stage 4 chronic kidney disease, or unspecified chronic kidney disease: Secondary | ICD-10-CM | POA: Diagnosis not present

## 2021-06-22 DIAGNOSIS — K219 Gastro-esophageal reflux disease without esophagitis: Secondary | ICD-10-CM | POA: Diagnosis not present

## 2021-06-22 DIAGNOSIS — Z79899 Other long term (current) drug therapy: Secondary | ICD-10-CM | POA: Diagnosis not present

## 2021-06-22 DIAGNOSIS — S32110D Nondisplaced Zone I fracture of sacrum, subsequent encounter for fracture with routine healing: Secondary | ICD-10-CM | POA: Diagnosis not present

## 2021-06-22 DIAGNOSIS — Y9301 Activity, walking, marching and hiking: Secondary | ICD-10-CM | POA: Diagnosis present

## 2021-06-22 DIAGNOSIS — S32591A Other specified fracture of right pubis, initial encounter for closed fracture: Secondary | ICD-10-CM | POA: Diagnosis present

## 2021-06-22 DIAGNOSIS — M8448XA Pathological fracture, other site, initial encounter for fracture: Secondary | ICD-10-CM | POA: Diagnosis present

## 2021-06-22 DIAGNOSIS — S0003XA Contusion of scalp, initial encounter: Secondary | ICD-10-CM | POA: Diagnosis present

## 2021-06-22 DIAGNOSIS — F0390 Unspecified dementia without behavioral disturbance: Secondary | ICD-10-CM | POA: Diagnosis present

## 2021-06-22 DIAGNOSIS — Z8711 Personal history of peptic ulcer disease: Secondary | ICD-10-CM | POA: Diagnosis not present

## 2021-06-22 DIAGNOSIS — Z66 Do not resuscitate: Secondary | ICD-10-CM | POA: Diagnosis present

## 2021-06-22 DIAGNOSIS — D539 Nutritional anemia, unspecified: Secondary | ICD-10-CM | POA: Diagnosis present

## 2021-06-22 DIAGNOSIS — Y92038 Other place in apartment as the place of occurrence of the external cause: Secondary | ICD-10-CM | POA: Diagnosis not present

## 2021-06-22 DIAGNOSIS — S32592D Other specified fracture of left pubis, subsequent encounter for fracture with routine healing: Secondary | ICD-10-CM | POA: Diagnosis not present

## 2021-06-22 DIAGNOSIS — I10 Essential (primary) hypertension: Secondary | ICD-10-CM | POA: Diagnosis not present

## 2021-06-22 DIAGNOSIS — I129 Hypertensive chronic kidney disease with stage 1 through stage 4 chronic kidney disease, or unspecified chronic kidney disease: Secondary | ICD-10-CM | POA: Diagnosis present

## 2021-06-22 DIAGNOSIS — R2681 Unsteadiness on feet: Secondary | ICD-10-CM | POA: Diagnosis not present

## 2021-06-22 DIAGNOSIS — S32301A Unspecified fracture of right ilium, initial encounter for closed fracture: Secondary | ICD-10-CM | POA: Diagnosis not present

## 2021-06-22 DIAGNOSIS — S32592A Other specified fracture of left pubis, initial encounter for closed fracture: Secondary | ICD-10-CM | POA: Diagnosis present

## 2021-06-22 DIAGNOSIS — Z791 Long term (current) use of non-steroidal anti-inflammatories (NSAID): Secondary | ICD-10-CM | POA: Diagnosis not present

## 2021-06-22 DIAGNOSIS — N1832 Chronic kidney disease, stage 3b: Secondary | ICD-10-CM | POA: Diagnosis present

## 2021-06-22 DIAGNOSIS — Z9181 History of falling: Secondary | ICD-10-CM | POA: Diagnosis not present

## 2021-06-22 DIAGNOSIS — R262 Difficulty in walking, not elsewhere classified: Secondary | ICD-10-CM | POA: Diagnosis not present

## 2021-06-22 DIAGNOSIS — Z90710 Acquired absence of both cervix and uterus: Secondary | ICD-10-CM | POA: Diagnosis not present

## 2021-06-22 DIAGNOSIS — S32512D Fracture of superior rim of left pubis, subsequent encounter for fracture with routine healing: Secondary | ICD-10-CM | POA: Diagnosis not present

## 2021-06-22 DIAGNOSIS — Z20822 Contact with and (suspected) exposure to covid-19: Secondary | ICD-10-CM | POA: Diagnosis present

## 2021-06-22 DIAGNOSIS — M1990 Unspecified osteoarthritis, unspecified site: Secondary | ICD-10-CM | POA: Diagnosis present

## 2021-06-22 DIAGNOSIS — Z741 Need for assistance with personal care: Secondary | ICD-10-CM | POA: Diagnosis not present

## 2021-06-22 DIAGNOSIS — Z961 Presence of intraocular lens: Secondary | ICD-10-CM | POA: Diagnosis not present

## 2021-06-22 LAB — CBC
HCT: 31.1 % — ABNORMAL LOW (ref 36.0–46.0)
Hemoglobin: 10.4 g/dL — ABNORMAL LOW (ref 12.0–15.0)
MCH: 33.7 pg (ref 26.0–34.0)
MCHC: 33.4 g/dL (ref 30.0–36.0)
MCV: 100.6 fL — ABNORMAL HIGH (ref 80.0–100.0)
Platelets: 248 10*3/uL (ref 150–400)
RBC: 3.09 MIL/uL — ABNORMAL LOW (ref 3.87–5.11)
RDW: 14.9 % (ref 11.5–15.5)
WBC: 9.7 10*3/uL (ref 4.0–10.5)
nRBC: 0 % (ref 0.0–0.2)

## 2021-06-22 LAB — VITAMIN B12: Vitamin B-12: 506 pg/mL (ref 180–914)

## 2021-06-22 LAB — BASIC METABOLIC PANEL
Anion gap: 8 (ref 5–15)
BUN: 35 mg/dL — ABNORMAL HIGH (ref 8–23)
CO2: 24 mmol/L (ref 22–32)
Calcium: 8.3 mg/dL — ABNORMAL LOW (ref 8.9–10.3)
Chloride: 105 mmol/L (ref 98–111)
Creatinine, Ser: 2.03 mg/dL — ABNORMAL HIGH (ref 0.44–1.00)
GFR, Estimated: 23 mL/min — ABNORMAL LOW (ref >60)
Glucose, Bld: 105 mg/dL — ABNORMAL HIGH (ref 70–99)
Potassium: 4.4 mmol/L (ref 3.5–5.1)
Sodium: 137 mmol/L (ref 135–145)

## 2021-06-22 LAB — FOLATE: Folate: 24.1 ng/mL (ref >5.9)

## 2021-06-22 MED ORDER — LACTATED RINGERS IV SOLN: INTRAVENOUS | Status: AC

## 2021-06-22 NOTE — TOC Initial Note (Signed)
Transition of Care (TOC) - Initial/Assessment Note  ? ? ?Patient Details  ?Name: Paula Mason ?MRN: [id number] ?Date of Birth: [birth year] ? ?Transition of Care (TOC) CM/SW Contact:    ?Nyaja Dubuque D, LCSW ?Phone Number: ?06/22/2021, 2:00 PM ? ?Clinical Narrative:                 ?Patient a resident at [facility]. Here for pelvic fracture. Recommended for SNF. Caregiver, Tiffany at bedside. Tiffany comes over daily to give patient her medications and bathes her twice weekly. Patient is agreeable to SNF. Patient is independent at baseline.  ?SNF referrals sent to facilities of choice.  ?Per caregiver, patient is vaccinated for COVID.   ? ?Expected Discharge Plan: [place] ?Barriers to Discharge: Continued Medical Work up ? ? ?Patient Goals and CMS Choice ?Patient states their goals for this hospitalization and ongoing recovery are:: Rehab and home ?  ?  ? ?Expected Discharge Plan and Services ?Expected Discharge Plan: [place] ?  ?  ?Post Acute Care Choice: [place] ?Living arrangements for the past 2 months: [place] ?                ?  ?  ?  ?  ?  ?  ?  ?  ?  ?  ? ?Prior Living Arrangements/Services ?Living arrangements for the past 2 months: [place] ?Lives with:: Self ?Patient language and need for interpreter reviewed:: Yes ?Do you feel safe going back to the place where you live?: Yes   n/a  ?Need for Family Participation in Patient Care: Yes (Comment) ?Care giver support system in place?: Yes (comment) ?  ?Criminal Activity/Legal Involvement Pertinent to Current Situation/Hospitalization: No - Comment as needed ? ?Activities of Daily Living ?Home Assistive Devices/Equipment: Gilford Rile (specify type), Hearing aid ?ADL Screening (condition at time of admission) ?Patient's cognitive ability adequate to safely complete daily activities?: Yes ?Is the patient deaf or have difficulty hearing?: Yes ?Does the patient have difficulty  seeing, even when wearing glasses/contacts?: No ?Does the patient have difficulty concentrating, remembering, or making decisions?: No ?Patient able to express need for assistance with ADLs?: Yes ?Does the patient have difficulty dressing or bathing?: Yes ?Independently performs ADLs?: No ?Communication: Independent ?Dressing (OT): Needs assistance ?Is this a change from baseline?: Pre-admission baseline ?Grooming: Needs assistance ?Is this a change from baseline?: Pre-admission baseline ?Feeding: Independent ?Bathing: Needs assistance ?Is this a change from baseline?: Pre-admission baseline ?Toileting: Needs assistance ?Is this a change from baseline?: Pre-admission baseline ?In/Out Bed: Needs assistance ?Is this a change from baseline?: Pre-admission baseline ?Walks in Home: Needs assistance ?Is this a change from baseline?: Pre-admission baseline ?Does the patient have difficulty walking or climbing stairs?: Yes ?Weakness of Legs: Right ?Weakness of Arms/Hands: None ? ?Permission Sought/Granted ?Permission sought to share information with : Other (comment) ?  ? Share Information with NAME: Caregiver, Jonelle Sidle [phone] ?   ?   ?   ? ?Emotional Assessment ?Appearance:: Appears stated age ?  ?Affect (typically observed): Appropriate ?Orientation: : Oriented to Self, Oriented to Place, Oriented to  Time, Oriented to Situation ?Alcohol / Substance Use: Not Applicable ?Psych Involvement: No (comment) ? ?Admission diagnosis:  Pelvic fracture ([place]) [S32.9XXA] ?Sacral insufficiency fracture, initial encounter [J00.93GH] ?Closed fracture of pubic ramus, unspecified laterality, initial encounter ([place]) [S32.599A] ?Chronic kidney disease, unspecified CKD stage [N18.9] ?Patient Active Problem List  ? Diagnosis Date Noted  ? Pelvic fracture ([place]) 06/21/2021  ? Fall 06/21/2021  ? CKD (  chronic kidney disease) 06/21/2021  ? Peptic ulcer 06/21/2021  ? GERD (gastroesophageal reflux disease) 06/21/2021  ? Essential hypertension  06/21/2021  ? Atrial fibrillation, chronic ([place]) 06/21/2021  ? ?PCP:  Monico Blitz, MD ?Pharmacy:   ?[facility] ?[address] ?[place] [address] ?Phone: [phone] Fax: [phone] ? ? ? ? ?Social Determinants of Health (SDOH) Interventions ?  ? ?Readmission Risk Interventions ?No flowsheet data found. ? ? ?

## 2021-06-22 NOTE — Progress Notes (Signed)
Pt has been pleasant throughout this writers shift. Ambulated from bed to chair and is resting comfortably at this time. Has used the [facility] several times this shift with 1 assist. Bruising noted to R hip d/t fall pt had prior to arrival. Pt does not complain of any pain. Will continue to monitor.  ?

## 2021-06-22 NOTE — Plan of Care (Signed)
?  Problem: Acute Rehab OT Goals (only OT should resolve) ?Goal: Pt. Will Perform Grooming ?Flowsheets (Taken 06/22/2021 1003) ?Pt Will Perform Grooming: ? with supervision ? standing ?Goal: Pt. Will Perform Lower Body Dressing ?Flowsheets (Taken 06/22/2021 1003) ?Pt Will Perform Lower Body Dressing: ? with modified independence ? sit to/from stand ?Goal: Pt. Will Transfer To Toilet ?Flowsheets (Taken 06/22/2021 1003) ?Pt Will Transfer to Toilet: ? with modified independence ? stand pivot transfer ?Goal: Pt/Caregiver Will Perform Home Exercise Program ?Flowsheets (Taken 06/22/2021 1003) ?Pt/caregiver will Perform Home Exercise Program: ? Increased strength ? Both right and left upper extremity ? Independently ? Usha Slager OT, MOT ? ?

## 2021-06-22 NOTE — Hospital Course (Signed)
Per HPI: ?Paula Mason  is a 88 y.o. female, with past medical history of paroxysmal A-fib, not on anticoagulation due to perforated duodenal ulcer status postsurgical repair in May 2021, hypertension, GERD, patient presents to ED secondary to complaints of fall, pain, patient lives at a retirement facility, she usually ambulates with a walker, she was walking to the dining hall yesterday, without her walker, she remembered that she left the walker at her room, and she started to go back to her room when she ended up stumbling and falling, she did hit the back/right side of her head, and started to have pain in her hips as well, she denies dizziness, lightheadedness, loss of consciousness, any focal deficits around her fall, she was trying to ambulate today with pain, as well she noted bruising on the back of her head which prompted her to come to ED for evaluation. ?-ED her work-up significant for creatinine of 1.9, baseline is 1.5-1.6, globin of 11.5, she will hip x-ray with suspicion of trochanteric fracture, so CT pelvis was obtained, with no evidence of hip fracture, but significant for pelvic fracture 3 places including right inferior pubic ramus, left superior pubic ramus, and left parasymphyseal pubic bone, and insufficiency fracture and right sacrum as well, [facility] hospitalist consulted to admit. ? ?06/22/21: Patient was admitted with fall secondary to pelvic fracture and pubic rami and sacral region.  No plans for operative intervention per orthopedics.  PT recommending SNF placement. ?

## 2021-06-22 NOTE — Plan of Care (Signed)
?  Problem: Acute Rehab PT Goals(only PT should resolve) ?Goal: Pt Will Go Supine/Side To Sit ?Outcome: Progressing ?Flowsheets (Taken 06/22/2021 1212) ?Pt will go Supine/Side to Sit: with min guard assist ?Goal: Patient Will Transfer Sit To/From Stand ?Outcome: Progressing ?Flowsheets (Taken 06/22/2021 1212) ?Patient will transfer sit to/from stand: ? with min guard assist ? with minimal assist ?Goal: Pt Will Transfer Bed To Chair/Chair To Bed ?Outcome: Progressing ?Flowsheets (Taken 06/22/2021 1212) ?Pt will Transfer Bed to Chair/Chair to Bed: ? with min assist ? min guard assist ?Goal: Pt Will Ambulate ?Outcome: Progressing ?Flowsheets (Taken 06/22/2021 1212) ?Pt will Ambulate: ? 25 feet ? with minimal assist ? with moderate assist ? with rolling walker ?  ?12:12 PM, 06/22/21 ?Lonell Grandchild, MPT ?Physical Therapist with [place] ?[facility] ?[phone] office ?[phone] mobile phone ? ?

## 2021-06-22 NOTE — Evaluation (Signed)
Occupational Therapy Evaluation ?Patient Details ?Name: Paula Mason ?MRN: [id number] ?DOB: 07/28/1928 ?Today's Date: 06/22/2021 ? ? ?History of Present Illness Paula Mason  is a 88 y.o. female, with past medical history of paroxysmal A-fib, not on anticoagulation due to perforated duodenal ulcer status postsurgical repair in May 2021, hypertension, GERD, patient presents to ED secondary to complaints of fall, pain, patient lives at a retirement facility, she usually ambulates with a walker, she was walking to the dining hall yesterday, without her walker, she remembered that she left the walker at her room, and she started to go back to her room when she ended up stumbling and falling, she did hit the back/right side of her head, and started to have pain in her hips as well, she denies dizziness, lightheadedness, loss of consciousness, any focal deficits around her fall, she was trying to ambulate today with pain, as well she noted bruising on the back of her head which prompted her to come to ED for evaluation. (Taken per MD note)  ? ?Clinical Impression ?  ?Pt agreeable to OT and PT co-evaluation. Pt reports increased pain in R hip and low back with mobility. Assist needed for bed mobility with good balance once seated. Min A needed for transfers with RW due to difficulty weight bearing through R LE and increased pain. Slow labored movement with pt mostly pivoting R LE rather than full steps. Pt assisted for bathing at baseline. Mostly level of set up assist for ADL's once seated. Standing ADL's are more difficulty. Pt unsure of level of support available at her retirement community. SNF recommended if pt is unable to have Min A for transfers at retirement facility. Pt will benefit from continued OT in the hospital and recommended venue below to increase strength, balance, and endurance for safe ADL's.  ? ? ?   ? ?Recommendations for follow up therapy are one component of a multi-disciplinary discharge  planning process, led by the attending physician.  Recommendations may be updated based on patient status, additional functional criteria and insurance authorization.  ? ?Follow Up Recommendations ? Skilled nursing-short term rehab (<3 hours/day)  ?  ?Assistance Recommended at Discharge Intermittent Supervision/Assistance  ?Patient can return home with the following A little help with walking and/or transfers;A little help with bathing/dressing/bathroom;Assistance with cooking/housework;Assist for transportation ? ?  ?Functional Status Assessment ? Patient has had a recent decline in their functional status and demonstrates the ability to make significant improvements in function in a reasonable and predictable amount of time.  ?Equipment Recommendations ? None recommended by OT  ?  ?Recommendations for Other Services   ? ? ?  ?Precautions / Restrictions Precautions ?Precautions: Fall ?Restrictions ?Weight Bearing Restrictions: Yes ?RLE Weight Bearing: Weight bearing as tolerated  ? ?  ? ?Mobility Bed Mobility ?Overal bed mobility: Needs Assistance ?Bed Mobility: Supine to Sit ?  ?  ?Supine to sit: Min assist ?  ?  ?General bed mobility comments: Single hand held assist to pull to sit from bed. Labored movement with increased pain. ?  ? ?Transfers ?Overall transfer level: Needs assistance ?Equipment used: Rolling walker (2 wheels) ?Transfers: Sit to/from Stand, Bed to chair/wheelchair/BSC ?Sit to Stand: Min guard, Min assist ?  ?  ?Step pivot transfers: Min assist ?  ?  ?General transfer comment: Slow labored movment. Pt noted to pivot R LE rather than take full steps. ?  ? ?  ?Balance Overall balance assessment: Needs assistance ?Sitting-balance support: No upper extremity supported, Feet supported ?Sitting  balance-Leahy Scale: Good ?Sitting balance - Comments: seated EOB ?  ?Standing balance support: Bilateral upper extremity supported, During functional activity, Reliant on assistive device for balance ?Standing  balance-Leahy Scale: Poor ?Standing balance comment: poor to fair with RW. ?  ?  ?  ?  ?  ?  ?  ?  ?  ?  ?  ?   ? ?ADL either performed or assessed with clinical judgement  ? ?ADL Overall ADL's : Needs assistance/impaired ?  ?  ?Grooming: Standing;Moderate assistance ?  ?  ?  ?Lower Body Bathing: Set up;Sitting/lateral leans ?  ?Upper Body Dressing : Sitting;Independent ?  ?Lower Body Dressing: Sitting/lateral leans;Set up ?  ?Toilet Transfer: Minimal assistance;Rolling walker (2 wheels) ?Toilet Transfer Details (indicate cue type and reason): EOB to [facility] with RW ?Toileting- Clothing Manipulation and Hygiene: Set up;Sitting/lateral lean ?Toileting - Clothing Manipulation Details (indicate cue type and reason): Pt able to complete peri-care seated on BSC. ?  ?  ?Functional mobility during ADLs: Minimal assistance;Rolling walker (2 wheels) ?   ? ? ? ?Vision Baseline Vision/History: 1 Wears glasses ?Ability to See in Adequate Light: 1 Impaired ?Patient Visual Report: No change from baseline ?Vision Assessment?: No apparent visual deficits  ?   ?   ?  ?   ?  ? ?Pertinent Vitals/Pain Pain Assessment ?Pain Assessment: Faces ?Faces Pain Scale: Hurts even more ?Pain Location: R hip and low back ?Pain Descriptors / Indicators: Grimacing, Guarding ?Pain Intervention(s): Limited activity within patient's tolerance, Monitored during session, Premedicated before session, Repositioned  ? ? ? ?Hand Dominance Right ?  ?Extremity/Trunk Assessment Upper Extremity Assessment ?Upper Extremity Assessment: Generalized weakness;LUE deficits/detail (Good fine motor coordination.) ?RUE Deficits / Details: Limited to ~75% available range for P/ROM and A/ROM for shoulder flexion. Generally weak. ?  ?Lower Extremity Assessment ?Lower Extremity Assessment: Defer to PT evaluation ?  ?Cervical / Trunk Assessment ?Cervical / Trunk Assessment: Kyphotic ?  ?Communication Communication ?Communication: HOH ?  ?Cognition Arousal/Alertness:  Awake/alert ?Behavior During Therapy: [facility] for tasks assessed/performed ?Overall Cognitive Status: Within Functional Limits for tasks assessed ?  ?  ?  ?  ?  ?  ?  ?  ?  ?  ?  ?  ?  ?  ?  ?  ?  ?  ?  ?   ? ?  ?   ?  ?    ? ? ?Home Living Family/patient expects to be discharged to:: Other (Comment) (Retirement facility.) ?Living Arrangements: Alone ?Available Help at Discharge: Personal care attendant (3x a week to help with bathing) ?Type of Home: Apartment ?Home Access: Level entry ?  ?  ?Home Layout: One level ?  ?  ?Bathroom Shower/Tub: Walk-in shower ?  ?Bathroom Toilet: Handicapped height ?Bathroom Accessibility: Yes ?  ?Home Equipment: Conservation officer, nature (2 wheels);Shower seat ?  ?  ?  ? ?  ?Prior Functioning/Environment Prior Level of Function : Needs assist ?  ?  ?  ?Physical Assist : Mobility (physical);ADLs (physical) ?Mobility (physical): Transfers;Gait;Stairs ?ADLs (physical): Bathing ?Mobility Comments: Pt reports ambualtion in retirement facility with RW without assist. ?ADLs Comments: Pt reports assist with only bathing. All other ADL's are independnet. Assisted for IADL's by retirement facility staff. ?  ? ?  ?  ?OT Problem List: Decreased strength;Decreased activity tolerance;Impaired balance (sitting and/or standing);Pain ?  ?   ?OT Treatment/Interventions: Self-care/ADL training;Therapeutic exercise;Therapeutic activities;Patient/family education;Balance training;DME and/or AE instruction;Energy conservation  ?  ?OT Goals(Current goals can be found in the care plan  section) Acute Rehab OT Goals ?Patient Stated Goal: Pt wishes to get a new place  that has not steps to enter. ?OT Goal Formulation: With patient ?Time For Goal Achievement: 07/06/21 ?Potential to Achieve Goals: Good  ?OT Frequency: Min 2X/week ?  ? ?Co-evaluation PT/OT/SLP Co-Evaluation/Treatment: Yes ?Reason for Co-Treatment: To address functional/ADL transfers ?  ?OT goals addressed during session: ADL's and self-care ?  ? ?  ?AM-PAC OT  "6 Clicks" Daily Activity     ?Outcome Measure Help from another person eating meals?: None ?Help from another person taking care of personal grooming?: A Lot ?Help from another person toileting, which includes using toli

## 2021-06-22 NOTE — Evaluation (Signed)
Physical Therapy Evaluation ?Patient Details ?Name: Paula Mason ?MRN: [id number] ?DOB: [birth year] ?Today's Date: 06/22/2021 ? ?History of Present Illness ? Paula Mason  is a 88 y.o. female, with past medical history of paroxysmal A-fib, not on anticoagulation due to perforated duodenal ulcer status postsurgical repair in May 2021, hypertension, GERD, patient presents to ED secondary to complaints of fall, pain, patient lives at a retirement facility, she usually ambulates with a walker, she was walking to the dining hall yesterday, without her walker, she remembered that she left the walker at her room, and she started to go back to her room when she ended up stumbling and falling, she did hit the back/right side of her head, and started to have pain in her hips as well, she denies dizziness, lightheadedness, loss of consciousness, any focal deficits around her fall, she was trying to ambulate today with pain, as well she noted bruising on the back of her head which prompted her to come to ED for evaluation. ?  ?Clinical Impression ? Patient demonstrates slow labored movement for sitting up at bedside, has difficulty scooting to EOB due to increased groin/hip pain and limited to a few steps at bedside demonstrating slow labored unsteady steps with increased pain when weightbearing on RLE.  Patient able to transfer to [facility] to urinate, but unable to tolerate walking away from bedside due to fatigue and increasing pain.  Patient tolerated sitting up in chair after therapy - nursing staff notified.  Patient will benefit from continued skilled physical therapy in hospital and recommended venue below to increase strength, balance, endurance for safe ADLs and gait.  ? ?   ? ?Recommendations for follow up therapy are one component of a multi-disciplinary discharge planning process, led by the attending physician.  Recommendations may be updated based on patient status, additional functional criteria and insurance  authorization. ? ?Follow Up Recommendations Skilled nursing-short term rehab (<3 hours/day) ? ?  ?Assistance Recommended at Discharge Set up Supervision/Assistance  ?Patient can return home with the following ? A lot of help with walking and/or transfers;A lot of help with bathing/dressing/bathroom;Help with stairs or ramp for entrance;Assistance with cooking/housework ? ?  ?Equipment Recommendations None recommended by PT  ?Recommendations for Other Services ?    ?  ?Functional Status Assessment Patient has had a recent decline in their functional status and demonstrates the ability to make significant improvements in function in a reasonable and predictable amount of time.  ? ?  ?Precautions / Restrictions Precautions ?Precautions: Fall ?Restrictions ?Weight Bearing Restrictions: Yes ?RLE Weight Bearing: Weight bearing as tolerated  ? ?  ? ?Mobility ? Bed Mobility ?Overal bed mobility: Needs Assistance ?Bed Mobility: Supine to Sit ?  ?  ?Supine to sit: Min assist ?  ?  ?General bed mobility comments: as per OT notes ?  ? ?Transfers ?Overall transfer level: Needs assistance ?Equipment used: Rolling walker (2 wheels) ?Transfers: Sit to/from Stand, Bed to chair/wheelchair/BSC ?Sit to Stand: Min guard, Min assist ?  ?Step pivot transfers: Min assist ?  ?  ?  ?General transfer comment: as per OT notes ?  ? ?Ambulation/Gait ?Ambulation/Gait assistance: Mod assist ?Gait Distance (Feet): 6 Feet ?Assistive device: Rolling walker (2 wheels) ?Gait Pattern/deviations: Decreased step length - left, Decreased stance time - right, Decreased stride length, Antalgic, Trunk flexed ?Gait velocity: decreased ?  ?  ?General Gait Details: limited to a few steps at bedside demonstrating slow labored unsteady steps with increased pain when weightbearing on RLE ? ?Stairs ?  ?  ?  ?  ?  ? ?  Wheelchair Mobility ?  ? ?Modified Rankin (Stroke Patients Only) ?  ? ?  ? ?Balance Overall balance assessment: Needs assistance ?Sitting-balance  support: Feet supported, No upper extremity supported ?Sitting balance-Leahy Scale: Good ?Sitting balance - Comments: seated EOB ?  ?Standing balance support: Bilateral upper extremity supported, During functional activity, Reliant on assistive device for balance ?Standing balance-Leahy Scale: Poor ?Standing balance comment: fair/poor using RW ?  ?  ?  ?  ?  ?  ?  ?  ?  ?  ?  ?   ? ? ? ?Pertinent Vitals/Pain Pain Assessment ?Pain Assessment: Faces ?Faces Pain Scale: Hurts even more ?Pain Location: R hip and low back ?Pain Descriptors / Indicators: Grimacing, Guarding, Sore ?Pain Intervention(s): Limited activity within patient's tolerance, Monitored during session, Premedicated before session, Repositioned  ? ? ?Home Living Family/patient expects to be discharged to:: Private residence ?Living Arrangements: Alone ?Available Help at Discharge: Personal care attendant ?Type of Home: Apartment ?Home Access: Level entry ?  ?  ?  ?Home Layout: One level ?Home Equipment: Conservation officer, nature (2 wheels);Shower seat ?   ?  ?Prior Function Prior Level of Function : Needs assist ?  ?  ?  ?Physical Assist : Mobility (physical);ADLs (physical) ?Mobility (physical): Transfers;Gait;Stairs;Bed mobility ?ADLs (physical): Bathing ?Mobility Comments: Pt reports ambualtion in retirement facility with RW without assist. ?ADLs Comments: Pt reports assist with only bathing. All other ADL's are independnet. Assisted for IADL's by retirement facility staff. ?  ? ? ?Hand Dominance  ? Dominant Hand: Right ? ?  ?Extremity/Trunk Assessment  ? Upper Extremity Assessment ?Upper Extremity Assessment: Defer to OT evaluation ?RUE Deficits / Details: Limited to ~75% available range for P/ROM and A/ROM for shoulder flexion. Generally weak. ?  ? ?Lower Extremity Assessment ?Lower Extremity Assessment: Generalized weakness;RLE deficits/detail;LLE deficits/detail ?RLE Deficits / Details: grossly 3+/5 ?RLE: Unable to fully assess due to pain ?RLE Sensation:  WNL ?RLE Coordination: WNL ?LLE Deficits / Details: grossly -4/5 ?LLE: Unable to fully assess due to pain ?LLE Sensation: WNL ?LLE Coordination: WNL ?  ? ?Cervical / Trunk Assessment ?Cervical / Trunk Assessment: Kyphotic  ?Communication  ? Communication: HOH  ?Cognition Arousal/Alertness: Awake/alert ?Behavior During Therapy: [facility] for tasks assessed/performed ?Overall Cognitive Status: Within Functional Limits for tasks assessed ?  ?  ?  ?  ?  ?  ?  ?  ?  ?  ?  ?  ?  ?  ?  ?  ?  ?  ?  ? ?  ?General Comments   ? ?  ?Exercises    ? ?Assessment/Plan  ?  ?PT Assessment Patient needs continued PT services  ?PT Problem List Decreased strength;Decreased activity tolerance;Decreased balance;Decreased mobility ? ?   ?  ?PT Treatment Interventions DME instruction;Gait training;Stair training;Functional mobility training;Therapeutic activities;Therapeutic exercise;Patient/family education;Balance training   ? ?PT Goals (Current goals can be found in the Care Plan section)  ?Acute Rehab PT Goals ?Patient Stated Goal: return home after rehab ?PT Goal Formulation: With patient ?Time For Goal Achievement: 07/06/21 ?Potential to Achieve Goals: Good ? ?  ?Frequency Min 3X/week ?  ? ? ?Co-evaluation PT/OT/SLP Co-Evaluation/Treatment: Yes ?Reason for Co-Treatment: To address functional/ADL transfers ?PT goals addressed during session: Mobility/safety with mobility;Balance;Proper use of DME ?OT goals addressed during session: ADL's and self-care ?  ? ? ?  ?AM-PAC PT "6 Clicks" Mobility  ?Outcome Measure Help needed turning from your back to your side while in a flat bed without using bedrails?: A Little ?Help needed moving  from lying on your back to sitting on the side of a flat bed without using bedrails?: A Little ?Help needed moving to and from a bed to a chair (including a wheelchair)?: A Lot ?Help needed standing up from a chair using your arms (e.g., wheelchair or bedside chair)?: A Lot ?Help needed to walk in hospital room?: A  Lot ?Help needed climbing 3-5 steps with a railing? : A Lot ?6 Click Score: 14 ? ?  ?End of Session   ?Activity Tolerance: Patient tolerated treatment well;Patient limited by fatigue ?Patient left: in chair;with call

## 2021-06-22 NOTE — Progress Notes (Signed)
?PROGRESS NOTE ? ? ? ?Paula Mason  NKN:[id number] DOB: 1928-08-16 DOA: 06/21/2021 ?PCP: Monico Blitz, MD ? ? ?Brief Narrative:  ?Per HPI: ?Paula Mason  is a 88 y.o. female, with past medical history of paroxysmal A-fib, not on anticoagulation due to perforated duodenal ulcer status postsurgical repair in May 2021, hypertension, GERD, patient presents to ED secondary to complaints of fall, pain, patient lives at a retirement facility, she usually ambulates with a walker, she was walking to the dining hall yesterday, without her walker, she remembered that she left the walker at her room, and she started to go back to her room when she ended up stumbling and falling, she did hit the back/right side of her head, and started to have pain in her hips as well, she denies dizziness, lightheadedness, loss of consciousness, any focal deficits around her fall, she was trying to ambulate today with pain, as well she noted bruising on the back of her head which prompted her to come to ED for evaluation. ?-ED her work-up significant for creatinine of 1.9, baseline is 1.5-1.6, globin of 11.5, she will hip x-ray with suspicion of trochanteric fracture, so CT pelvis was obtained, with no evidence of hip fracture, but significant for pelvic fracture 3 places including right inferior pubic ramus, left superior pubic ramus, and left parasymphyseal pubic bone, and insufficiency fracture and right sacrum as well, [facility] hospitalist consulted to admit. ? ?06/22/21: Patient was admitted with fall secondary to pelvic fracture and pubic rami and sacral region.  No plans for operative intervention per orthopedics.  PT recommending SNF placement.  ? ? ?Assessment & Plan: ?  ?Principal Problem: ?  Pelvic fracture ([place]) ?Active Problems: ?  Fall ?  Atrial fibrillation, chronic ([place]) ?  Essential hypertension ?  CKD (chronic kidney disease) ?  Peptic ulcer ?  GERD (gastroesophageal reflux disease) ? ?Assessment and Plan: ? ? ?FALL with pelvic  fracture ?-Patient presents with catechol fall, not related to syncope/near syncope ?-Imaging significant for acute nondisplaced fractures of the right inferior pubic ramus, left superior pubic ramus, and left parasymphyseal pubic bone, as well subacute/possibly acute fracture in the right sacrum, likely insufficiency fracture. ?-We will consult orthopedic if any further recommendation bedside pain control, weightbearing as tolerated . ?-We will consult PT/OT  ?-Continue with pain control . ?-Likely will need placement at a higher level of care given her sites at her pelvic fracture, need for pain control, and extensive PT/OT . ?  ?History of chronic A-fib ?-Continue with Toprol-XL and amiodarone for heart rate control ?-Aspirin, will continue, she is not on full anticoagulation due to history of peptic ulcer disease requiring resection in 2021, as well her current fall is one more reason to avoid full anticoagulation. ?  ?CKD stage IIIB with questionable AKI ?-Baseline creatinine is 1.6, it is 2.0 currently, avoid nephrotoxic medications, continue with gentle hydration ?-Follow a.m. labs ?  ?Ulcer disease/GERD ?-Continue with PPI ?  ?Hypertension ?-Continue with home medication Norvasc and Toprol, will resume lisinopril later. ?  ?Macrocytic anemia ?-We will check F79 and folic acid ? ?  ?DVT prophylaxis: Heparin ?Code Status: DNR ?Family Communication:  ?Disposition Plan:  ?Status is: Observation ?The patient will require care spanning > 2 midnights and should be moved to inpatient because: IV medications and SNF placement. ? ?Consultants:  ?Orthopedics ? ?Procedures:  ?See below ? ?Antimicrobials:  ?None ? ? ?Subjective: ?Patient seen and evaluated today with no new acute complaints or concerns. No acute concerns or  events noted overnight. ? ?Objective: ?Vitals:  ? 06/21/21 1659 06/21/21 2114 06/22/21 0051 06/22/21 0521  ?BP: 135/88 (!) 121/57 (!) 146/52 (!) 131/53  ?Pulse: 73 71 77 73  ?Resp: (!) 21 20 18 19    ?Temp: 98.4 ?F (36.9 ?C) 98.7 ?F (37.1 ?C) 98.9 ?F (37.2 ?C) 99.3 ?F (37.4 ?C)  ?TempSrc: Oral Oral Oral Oral  ?SpO2: 96% 94% 90% 98%  ?Weight:      ?Height:      ? ? ?Intake/Output Summary (Last 24 hours) at 06/22/2021 1035 ?Last data filed at 06/22/2021 0900 ?Gross per 24 hour  ?Intake 648.27 ml  ?Output --  ?Net 648.27 ml  ? ?Filed Weights  ? 06/21/21 1038  ?Weight: 54.9 kg  ? ? ?Examination: ? ?General exam: Appears calm and comfortable  ?Respiratory system: Clear to auscultation. Respiratory effort normal. ?Cardiovascular system: S1 & S2 heard, RRR.  ?Gastrointestinal system: Abdomen is soft ?Central nervous system: Alert and awake ?Extremities: No edema ?Skin: No significant lesions noted ?Psychiatry: Flat affect. ? ? ? ?Data Reviewed: I have personally reviewed following labs and imaging studies ? ?CBC: ?Recent Labs  ?Lab 06/21/21 ?1250 06/22/21 ?0459  ?WBC 9.1 9.7  ?HGB 11.5* 10.4*  ?HCT 34.6* 31.1*  ?MCV 100.6* 100.6*  ?PLT 268 248  ? ?Basic Metabolic Panel: ?Recent Labs  ?Lab 06/21/21 ?1250 06/22/21 ?0459  ?NA 139 137  ?K 4.1 4.4  ?CL 106 105  ?CO2 24 24  ?GLUCOSE 124* 105*  ?BUN 38* 35*  ?CREATININE 1.93* 2.03*  ?CALCIUM 8.6* 8.3*  ? ?GFR: ?Estimated Creatinine Clearance: 15.2 mL/min (A) (by C-G formula based on SCr of 2.03 mg/dL (H)). ?Liver Function Tests: ?No results for input(s): AST, ALT, ALKPHOS, BILITOT, PROT, ALBUMIN in the last 168 hours. ?No results for input(s): LIPASE, AMYLASE in the last 168 hours. ?No results for input(s): AMMONIA in the last 168 hours. ?Coagulation Profile: ?No results for input(s): INR, PROTIME in the last 168 hours. ?Cardiac Enzymes: ?No results for input(s): CKTOTAL, CKMB, CKMBINDEX, TROPONINI in the last 168 hours. ?BNP (last 3 results) ?No results for input(s): PROBNP in the last 8760 hours. ?HbA1C: ?No results for input(s): HGBA1C in the last 72 hours. ?CBG: ?No results for input(s): GLUCAP in the last 168 hours. ?Lipid Profile: ?No results for input(s): CHOL, HDL,  LDLCALC, TRIG, CHOLHDL, LDLDIRECT in the last 72 hours. ?Thyroid Function Tests: ?No results for input(s): TSH, T4TOTAL, FREET4, T3FREE, THYROIDAB in the last 72 hours. ?Anemia Panel: ?Recent Labs  ?  06/22/21 ?0459  ?VITAMINB12 506  ?FOLATE 24.1  ? ?Sepsis Labs: ?No results for input(s): PROCALCITON, LATICACIDVEN in the last 168 hours. ? ?Recent Results (from the past 240 hour(s))  ?Resp Panel by RT-PCR (Flu A&B, Covid) Nasopharyngeal Swab     Status: None  ? Collection Time: 06/21/21  4:21 PM  ? Specimen: Nasopharyngeal Swab; Nasopharyngeal(NP) swabs in vial transport medium  ?Result Value Ref Range Status  ? SARS Coronavirus 2 by RT PCR NEGATIVE NEGATIVE Final  ?  Comment: (NOTE) ?SARS-CoV-2 target nucleic acids are NOT DETECTED. ? ?The SARS-CoV-2 RNA is generally detectable in upper respiratory ?specimens during the acute phase of infection. The lowest ?concentration of SARS-CoV-2 viral copies this assay can detect is ?138 copies/mL. A negative result does not preclude SARS-Cov-2 ?infection and should not be used as the sole basis for treatment or ?other patient management decisions. A negative result may occur with  ?improper specimen collection/handling, submission of specimen other ?than nasopharyngeal swab, presence of viral mutation(s) within  the ?areas targeted by this assay, and inadequate number of viral ?copies(<138 copies/mL). A negative result must be combined with ?clinical observations, patient history, and epidemiological ?information. The expected result is Negative. ? ?Fact Sheet for Patients:  ?EntrepreneurPulse.com.au ? ?Fact Sheet for Healthcare Providers:  ?IncredibleEmployment.be ? ?This test is no t yet approved or cleared by the [place] FDA and  ?has been authorized for detection and/or diagnosis of SARS-CoV-2 by ?FDA under an Emergency Use Authorization (EUA). This EUA will remain  ?in effect (meaning this test can be used) for the duration of  the ?COVID-19 declaration under Section 564(b)(1) of the Act, 21 ?U.S.C.section 360bbb-3(b)(1), unless the authorization is terminated  ?or revoked sooner.  ? ? ?  ? Influenza A by PCR NEGATIVE NEGATIVE Final

## 2021-06-22 NOTE — Progress Notes (Signed)
Patient ID: Paula Mason, female   DOB: 1928-09-14, 88 y.o.   MRN: [id number] ? ? ?The pelvic fracture is non operative  ? ?Go ahead and start PT wbat  ?

## 2021-06-22 NOTE — Consult Note (Signed)
Reason for Consult: Fracture of the right hemipelvis ?Referring Physician: Dr. Manuella Ghazi, [place] ? ?Paula Mason is an 88 y.o. female.  ?HPI: 88 year old female fell mechanical fall injured her right hip work-up included x-rays and CT scan which revealed a right hemipelvic fracture is nondisplaced requires CT for definitive imaging she complains of pain pain with range of motion she cannot weight-bear ? ?Past Medical History:  ?Diagnosis Date  ? Arthritis   ? GERD (gastroesophageal reflux disease)   ? Hypertension   ? ? ?Past Surgical History:  ?Procedure Laterality Date  ? ABDOMINAL HYSTERECTOMY    ? [place]  ? BREAST SURGERY Left   ? benign  ? CATARACT EXTRACTION W/PHACO Right 08/03/2013  ? Procedure: CATARACT EXTRACTION PHACO AND INTRAOCULAR LENS PLACEMENT (IOC);  Surgeon: Tonny Branch, MD;  Location: [facility];  Service: Ophthalmology;  Laterality: Right;  CDE 13.72  ? CHOLECYSTECTOMY    ? ? ?History reviewed. No pertinent family history. ? ?Social History:  reports that she has never smoked. She has never used smokeless tobacco. She reports that she does not drink alcohol and does not use drugs. ? ?Allergies: No Known Allergies ? ?Medications: Scheduled: ? amiodarone  200 mg Oral Daily  ? amLODipine  5 mg Oral Daily  ? calcium carbonate  1,250 mg Oral BID WC  ? heparin  5,000 Units Subcutaneous Q8H  ? metoprolol succinate  12.5 mg Oral Daily  ? pantoprazole  40 mg Oral Daily  ? ? ?Results for orders placed or performed during the hospital encounter of 06/21/21 (from the past 48 hour(s))  ?CBC     Status: Abnormal  ? Collection Time: 06/21/21 12:50 PM  ?Result Value Ref Range  ? WBC 9.1 4.0 - 10.5 K/uL  ? RBC 3.44 (L) 3.87 - 5.11 MIL/uL  ? Hemoglobin 11.5 (L) 12.0 - 15.0 g/dL  ? HCT 34.6 (L) 36.0 - 46.0 %  ? MCV 100.6 (H) 80.0 - 100.0 fL  ? MCH 33.4 26.0 - 34.0 pg  ? MCHC 33.2 30.0 - 36.0 g/dL  ? RDW 15.0 11.5 - 15.5 %  ? Platelets 268 150 - 400 K/uL  ? nRBC 0.0 0.0 - 0.2 %  ?  Comment: Performed at [facility], [address], [place], [place] [address]  ?Basic metabolic panel     Status: Abnormal  ? Collection Time: 06/21/21 12:50 PM  ?Result Value Ref Range  ? Sodium 139 135 - 145 mmol/L  ? Potassium 4.1 3.5 - 5.1 mmol/L  ? Chloride 106 98 - 111 mmol/L  ? CO2 24 22 - 32 mmol/L  ? Glucose, Bld 124 (H) 70 - 99 mg/dL  ?  Comment: Glucose reference range applies only to samples taken after fasting for at least 8 hours.  ? BUN 38 (H) 8 - 23 mg/dL  ? Creatinine, Ser 1.93 (H) 0.44 - 1.00 mg/dL  ? Calcium 8.6 (L) 8.9 - 10.3 mg/dL  ? GFR, Estimated 24 (L) >60 mL/min  ?  Comment: (NOTE) ?Calculated using the CKD-EPI Creatinine Equation (2021) ?  ? Anion gap 9 5 - 15  ?  Comment: Performed at [facility], [address], [place], [place] [address]  ?Resp Panel by RT-PCR (Flu A&B, Covid) Nasopharyngeal Swab     Status: None  ? Collection Time: 06/21/21  4:21 PM  ? Specimen: Nasopharyngeal Swab; Nasopharyngeal(NP) swabs in vial transport medium  ?Result Value Ref Range  ? SARS Coronavirus 2 by RT PCR NEGATIVE NEGATIVE  ?  Comment: (  NOTE) ?SARS-CoV-2 target nucleic acids are NOT DETECTED. ? ?The SARS-CoV-2 RNA is generally detectable in upper respiratory ?specimens during the acute phase of infection. The lowest ?concentration of SARS-CoV-2 viral copies this assay can detect is ?138 copies/mL. A negative result does not preclude SARS-Cov-2 ?infection and should not be used as the sole basis for treatment or ?other patient management decisions. A negative result may occur with  ?improper specimen collection/handling, submission of specimen other ?than nasopharyngeal swab, presence of viral mutation(s) within the ?areas targeted by this assay, and inadequate number of viral ?copies(<138 copies/mL). A negative result must be combined with ?clinical observations, patient history, and epidemiological ?information. The expected result is Negative. ? ?Fact Sheet for Patients:  ?EntrepreneurPulse.com.au ? ?Fact Sheet for  Healthcare Providers:  ?IncredibleEmployment.be ? ?This test is no t yet approved or cleared by the [place] FDA and  ?has been authorized for detection and/or diagnosis of SARS-CoV-2 by ?FDA under an Emergency Use Authorization (EUA). This EUA will remain  ?in effect (meaning this test can be used) for the duration of the ?COVID-19 declaration under Section 564(b)(1) of the Act, 21 ?U.S.C.section 360bbb-3(b)(1), unless the authorization is terminated  ?or revoked sooner.  ? ? ?  ? Influenza A by PCR NEGATIVE NEGATIVE  ? Influenza B by PCR NEGATIVE NEGATIVE  ?  Comment: (NOTE) ?The Xpert Xpress SARS-CoV-2/FLU/RSV plus assay is intended as an aid ?in the diagnosis of influenza from Nasopharyngeal swab specimens and ?should not be used as a sole basis for treatment. Nasal washings and ?aspirates are unacceptable for Xpert Xpress SARS-CoV-2/FLU/RSV ?testing. ? ?Fact Sheet for Patients: ?EntrepreneurPulse.com.au ? ?Fact Sheet for Healthcare Providers: ?IncredibleEmployment.be ? ?This test is not yet approved or cleared by the [place] FDA and ?has been authorized for detection and/or diagnosis of SARS-CoV-2 by ?FDA under an Emergency Use Authorization (EUA). This EUA will remain ?in effect (meaning this test can be used) for the duration of the ?COVID-19 declaration under Section 564(b)(1) of the Act, 21 U.S.C. ?section 360bbb-3(b)(1), unless the authorization is terminated or ?revoked. ? ?Performed at [facility], [address] [place], [place] [address] ?  ?Vitamin B12     Status: None  ? Collection Time: 06/22/21  4:59 AM  ?Result Value Ref Range  ? Vitamin B-12 506 180 - 914 pg/mL  ?  Comment: (NOTE) ?This assay is not validated for testing neonatal or ?myeloproliferative syndrome specimens for Vitamin B12 levels. ?Performed at [facility], [address] [place], [place] [address] ?  ?Basic metabolic panel     Status: Abnormal  ? Collection Time:  06/22/21  4:59 AM  ?Result Value Ref Range  ? Sodium 137 135 - 145 mmol/L  ? Potassium 4.4 3.5 - 5.1 mmol/L  ? Chloride 105 98 - 111 mmol/L  ? CO2 24 22 - 32 mmol/L  ? Glucose, Bld 105 (H) 70 - 99 mg/dL  ?  Comment: Glucose reference range applies only to samples taken after fasting for at least 8 hours.  ? BUN 35 (H) 8 - 23 mg/dL  ? Creatinine, Ser 2.03 (H) 0.44 - 1.00 mg/dL  ? Calcium 8.3 (L) 8.9 - 10.3 mg/dL  ? GFR, Estimated 23 (L) >60 mL/min  ?  Comment: (NOTE) ?Calculated using the CKD-EPI Creatinine Equation (2021) ?  ? Anion gap 8 5 - 15  ?  Comment: Performed at [facility], [address], [place], [place] [address]  ?CBC     Status: Abnormal  ? Collection Time: 06/22/21  4:59 AM  ?Result Value Ref Range  ? WBC 9.7 4.0 - 10.5 K/uL  ? RBC 3.09 (L) 3.87 - 5.11 MIL/uL  ? Hemoglobin 10.4 (L) 12.0 - 15.0 g/dL  ? HCT 31.1 (L) 36.0 - 46.0 %  ? MCV 100.6 (H) 80.0 - 100.0 fL  ? MCH 33.7 26.0 - 34.0 pg  ? MCHC 33.4 30.0 - 36.0 g/dL  ? RDW 14.9 11.5 - 15.5 %  ? Platelets 248 150 - 400 K/uL  ? nRBC 0.0 0.0 - 0.2 %  ?  Comment: Performed at [facility], [address] [place], [place] [address]  ? ? ?CT Head Wo Contrast ? ?Result Date: 06/21/2021 ?CLINICAL DATA:  Head trauma, minor (Age >= 65y); Neck trauma (Age >= 65y) EXAM: CT HEAD WITHOUT CONTRAST CT CERVICAL SPINE WITHOUT CONTRAST TECHNIQUE: Multidetector CT imaging of the head and cervical spine was performed following the standard protocol without intravenous contrast. Multiplanar CT image reconstructions of the cervical spine were also generated. RADIATION DOSE REDUCTION: This exam was performed according to the departmental dose-optimization program which includes automated exposure control, adjustment of the mA and/or kV according to patient size and/or use of iterative reconstruction technique. COMPARISON:  None. FINDINGS: CT HEAD FINDINGS Brain: No evidence of acute intracranial hemorrhage or extra-axial collection.No evidence of mass lesion/concerning mass  effect.The ventricles are normal in size.Scattered subcortical and periventricular white matter hypodensities, nonspecific but likely sequela of chronic small vessel ischemic disease.Mild cerebral atrophy Vascula

## 2021-06-22 NOTE — [place] FL2 (Signed)
?[place] MEDICAID FL2 LEVEL OF CARE SCREENING TOOL  ?  ? ?IDENTIFICATION  ?Patient Name: ?Paula Mason Birthdate: [birth year] Sex: female Admission Date (Current Location): ?06/21/2021  ?[place] and [place] Number: ? [place] and Address:  ?[facility] [address] [place] ?     Provider Number: ?[id number]  ?Attending Physician Name and Address:  ?Manuella Ghazi, Pratik D, DO ? Relative Name and Phone Number:  ?Garry Heater     [phone] ?   ?Current Level of Care: ?Hospital (observation) Recommended Level of Care: ?[place] Prior Approval Number: ?  ? ?Date Approved/Denied: ?  PASRR Number: ?[id number] A ? ?Discharge Plan: ?SNF ?  ? ?Current Diagnoses: ?Patient Active Problem List  ? Diagnosis Date Noted  ? Pelvic fracture ([place]) 06/21/2021  ? Fall 06/21/2021  ? CKD (chronic kidney disease) 06/21/2021  ? Peptic ulcer 06/21/2021  ? GERD (gastroesophageal reflux disease) 06/21/2021  ? Essential hypertension 06/21/2021  ? Atrial fibrillation, chronic ([place]) 06/21/2021  ? ? ?Orientation RESPIRATION BLADDER Height & Weight   ?  ?Self, Time, Situation, Place ? Normal Continent Weight: 121 lb (54.9 kg) ?Height:  5\' 3"  (160 cm)  ?BEHAVIORAL SYMPTOMS/MOOD NEUROLOGICAL BOWEL NUTRITION STATUS  ?    Continent Diet (heart healthy)  ?AMBULATORY STATUS COMMUNICATION OF NEEDS Skin   ?Limited Assist Verbally Normal ?  ?  ?  ?    ?     ?     ? ? ?Personal Care Assistance Level of Assistance  ?Bathing, Dressing, Feeding Bathing Assistance: Limited assistance ?Feeding assistance: Independent ?Dressing Assistance: Limited assistance ?   ? ?Functional Limitations Info  ?Sight, Hearing, Speech Sight Info: Adequate ?Hearing Info: Adequate ?Speech Info: Adequate  ? ? ?SPECIAL CARE FACTORS FREQUENCY  ?PT (By licensed PT), OT (By licensed OT)   ?  ?PT Frequency: 5x/week ?OT Frequency: 3x/week ?  ?  ?  ?   ? ? ?Contractures Contractures Info: Not present  ? ? ?Additional Factors Info   ?Code Status, Allergies Code Status Info: DNR ?Allergies Info: NKA ?  ?  ?  ?   ? ?Current Medications (06/22/2021):  This is the current hospital active medication list ?Current Facility-Administered Medications  ?Medication Dose Route Frequency Provider Last Rate Last Admin  ? acetaminophen (TYLENOL) tablet 650 mg  650 mg Oral Q6H PRN Elgergawy, Silver Huguenin, MD      ? Or  ? acetaminophen (TYLENOL) suppository 650 mg  650 mg Rectal Q6H PRN Elgergawy, Silver Huguenin, MD      ? amiodarone (PACERONE) tablet 200 mg  200 mg Oral Daily Elgergawy, Silver Huguenin, MD   200 mg at 06/22/21 0831  ? amLODipine (NORVASC) tablet 5 mg  5 mg Oral Daily Elgergawy, Silver Huguenin, MD   5 mg at 06/22/21 0831  ? calcium carbonate (TUMS - dosed in mg elemental calcium) chewable tablet 1,250 mg  1,250 mg Oral BID WC Elgergawy, Silver Huguenin, MD   1,250 mg at 06/22/21 0829  ? heparin injection 5,000 Units  5,000 Units Subcutaneous Q8H Elgergawy, Silver Huguenin, MD   5,000 Units at 06/22/21 1258  ? lactated ringers infusion   Intravenous Continuous Heath Lark D, DO 75 mL/hr at 06/22/21 1304 New Bag at 06/22/21 1304  ? metoprolol succinate (TOPROL-XL) 24 hr tablet 12.5 mg  12.5 mg Oral Daily Elgergawy, Silver Huguenin, MD   12.5 mg at 06/22/21 0830  ? morphine (PF) 2 MG/ML injection 1 mg  1 mg Intravenous Q4H PRN  Elgergawy, Silver Huguenin, MD      ? pantoprazole (PROTONIX) EC tablet 40 mg  40 mg Oral Daily Elgergawy, Silver Huguenin, MD   40 mg at 06/22/21 0831  ? traMADol (ULTRAM) tablet 50 mg  50 mg Oral Q6H PRN Elgergawy, Silver Huguenin, MD   50 mg at 06/22/21 0814  ? ? ? ?Discharge Medications: ?Please see discharge summary for a list of discharge medications. ? ?Relevant Imaging Results: ? ?Relevant Lab Results: ? ? ?Additional Information ?SSN [id number]. Per caregiver, patient is vaccinated for COVID. ? ?Ihor Gully, LCSW ? ? ? ? ?

## 2021-06-23 ENCOUNTER — Encounter (HOSPITAL_COMMUNITY): Payer: Self-pay

## 2021-06-23 DIAGNOSIS — U071 COVID-19: Secondary | ICD-10-CM | POA: Diagnosis not present

## 2021-06-23 DIAGNOSIS — R7982 Elevated C-reactive protein (CRP): Secondary | ICD-10-CM | POA: Diagnosis not present

## 2021-06-23 DIAGNOSIS — M160 Bilateral primary osteoarthritis of hip: Secondary | ICD-10-CM | POA: Diagnosis not present

## 2021-06-23 DIAGNOSIS — S3210XS Unspecified fracture of sacrum, sequela: Secondary | ICD-10-CM | POA: Diagnosis not present

## 2021-06-23 DIAGNOSIS — R634 Abnormal weight loss: Secondary | ICD-10-CM | POA: Diagnosis not present

## 2021-06-23 DIAGNOSIS — R935 Abnormal findings on diagnostic imaging of other abdominal regions, including retroperitoneum: Secondary | ICD-10-CM | POA: Diagnosis not present

## 2021-06-23 DIAGNOSIS — S72002A Fracture of unspecified part of neck of left femur, initial encounter for closed fracture: Secondary | ICD-10-CM | POA: Diagnosis not present

## 2021-06-23 DIAGNOSIS — R188 Other ascites: Secondary | ICD-10-CM | POA: Diagnosis not present

## 2021-06-23 DIAGNOSIS — J1282 Pneumonia due to coronavirus disease 2019: Secondary | ICD-10-CM | POA: Diagnosis not present

## 2021-06-23 DIAGNOSIS — S32309A Unspecified fracture of unspecified ilium, initial encounter for closed fracture: Secondary | ICD-10-CM | POA: Diagnosis not present

## 2021-06-23 DIAGNOSIS — S3282XS Multiple fractures of pelvis without disruption of pelvic ring, sequela: Secondary | ICD-10-CM | POA: Diagnosis not present

## 2021-06-23 DIAGNOSIS — Z741 Need for assistance with personal care: Secondary | ICD-10-CM | POA: Diagnosis not present

## 2021-06-23 DIAGNOSIS — S3219XA Other fracture of sacrum, initial encounter for closed fracture: Secondary | ICD-10-CM | POA: Diagnosis not present

## 2021-06-23 DIAGNOSIS — S32110D Nondisplaced Zone I fracture of sacrum, subsequent encounter for fracture with routine healing: Secondary | ICD-10-CM | POA: Diagnosis not present

## 2021-06-23 DIAGNOSIS — M1612 Unilateral primary osteoarthritis, left hip: Secondary | ICD-10-CM | POA: Diagnosis not present

## 2021-06-23 DIAGNOSIS — S32511A Fracture of superior rim of right pubis, initial encounter for closed fracture: Secondary | ICD-10-CM | POA: Diagnosis not present

## 2021-06-23 DIAGNOSIS — I480 Paroxysmal atrial fibrillation: Secondary | ICD-10-CM | POA: Diagnosis not present

## 2021-06-23 DIAGNOSIS — S32599G Other specified fracture of unspecified pubis, subsequent encounter for fracture with delayed healing: Secondary | ICD-10-CM | POA: Diagnosis not present

## 2021-06-23 DIAGNOSIS — R262 Difficulty in walking, not elsewhere classified: Secondary | ICD-10-CM | POA: Diagnosis not present

## 2021-06-23 DIAGNOSIS — S32592D Other specified fracture of left pubis, subsequent encounter for fracture with routine healing: Secondary | ICD-10-CM | POA: Diagnosis not present

## 2021-06-23 DIAGNOSIS — S3282XA Multiple fractures of pelvis without disruption of pelvic ring, initial encounter for closed fracture: Secondary | ICD-10-CM | POA: Diagnosis not present

## 2021-06-23 DIAGNOSIS — N189 Chronic kidney disease, unspecified: Secondary | ICD-10-CM | POA: Diagnosis not present

## 2021-06-23 DIAGNOSIS — S32501D Unspecified fracture of right pubis, subsequent encounter for fracture with routine healing: Secondary | ICD-10-CM | POA: Diagnosis not present

## 2021-06-23 DIAGNOSIS — M6281 Muscle weakness (generalized): Secondary | ICD-10-CM | POA: Diagnosis not present

## 2021-06-23 DIAGNOSIS — S3289XA Fracture of other parts of pelvis, initial encounter for closed fracture: Secondary | ICD-10-CM | POA: Diagnosis not present

## 2021-06-23 DIAGNOSIS — N184 Chronic kidney disease, stage 4 (severe): Secondary | ICD-10-CM | POA: Diagnosis not present

## 2021-06-23 DIAGNOSIS — S32491A Other specified fracture of right acetabulum, initial encounter for closed fracture: Secondary | ICD-10-CM | POA: Diagnosis not present

## 2021-06-23 DIAGNOSIS — D6869 Other thrombophilia: Secondary | ICD-10-CM | POA: Diagnosis not present

## 2021-06-23 DIAGNOSIS — K279 Peptic ulcer, site unspecified, unspecified as acute or chronic, without hemorrhage or perforation: Secondary | ICD-10-CM | POA: Diagnosis not present

## 2021-06-23 DIAGNOSIS — M25452 Effusion, left hip: Secondary | ICD-10-CM | POA: Diagnosis not present

## 2021-06-23 DIAGNOSIS — R279 Unspecified lack of coordination: Secondary | ICD-10-CM | POA: Diagnosis not present

## 2021-06-23 DIAGNOSIS — Z09 Encounter for follow-up examination after completed treatment for conditions other than malignant neoplasm: Secondary | ICD-10-CM | POA: Diagnosis not present

## 2021-06-23 DIAGNOSIS — I1310 Hypertensive heart and chronic kidney disease without heart failure, with stage 1 through stage 4 chronic kidney disease, or unspecified chronic kidney disease: Secondary | ICD-10-CM | POA: Diagnosis not present

## 2021-06-23 DIAGNOSIS — S32512A Fracture of superior rim of left pubis, initial encounter for closed fracture: Secondary | ICD-10-CM | POA: Diagnosis not present

## 2021-06-23 DIAGNOSIS — R3981 Functional urinary incontinence: Secondary | ICD-10-CM | POA: Diagnosis not present

## 2021-06-23 DIAGNOSIS — K219 Gastro-esophageal reflux disease without esophagitis: Secondary | ICD-10-CM | POA: Diagnosis not present

## 2021-06-23 DIAGNOSIS — S329XXS Fracture of unspecified parts of lumbosacral spine and pelvis, sequela: Secondary | ICD-10-CM | POA: Diagnosis not present

## 2021-06-23 DIAGNOSIS — S32512D Fracture of superior rim of left pubis, subsequent encounter for fracture with routine healing: Secondary | ICD-10-CM | POA: Diagnosis not present

## 2021-06-23 DIAGNOSIS — I482 Chronic atrial fibrillation, unspecified: Secondary | ICD-10-CM | POA: Diagnosis not present

## 2021-06-23 DIAGNOSIS — I10 Essential (primary) hypertension: Secondary | ICD-10-CM | POA: Diagnosis not present

## 2021-06-23 DIAGNOSIS — Z961 Presence of intraocular lens: Secondary | ICD-10-CM | POA: Diagnosis not present

## 2021-06-23 DIAGNOSIS — Z9181 History of falling: Secondary | ICD-10-CM | POA: Diagnosis not present

## 2021-06-23 DIAGNOSIS — M25451 Effusion, right hip: Secondary | ICD-10-CM | POA: Diagnosis not present

## 2021-06-23 DIAGNOSIS — R2681 Unsteadiness on feet: Secondary | ICD-10-CM | POA: Diagnosis not present

## 2021-06-23 LAB — CBC
HCT: 30.3 % — ABNORMAL LOW (ref 36.0–46.0)
Hemoglobin: 10.1 g/dL — ABNORMAL LOW (ref 12.0–15.0)
MCH: 33.9 pg (ref 26.0–34.0)
MCHC: 33.3 g/dL (ref 30.0–36.0)
MCV: 101.7 fL — ABNORMAL HIGH (ref 80.0–100.0)
Platelets: 234 10*3/uL (ref 150–400)
RBC: 2.98 MIL/uL — ABNORMAL LOW (ref 3.87–5.11)
RDW: 14.9 % (ref 11.5–15.5)
WBC: 8.8 10*3/uL (ref 4.0–10.5)
nRBC: 0 % (ref 0.0–0.2)

## 2021-06-23 LAB — BASIC METABOLIC PANEL
Anion gap: 7 (ref 5–15)
BUN: 37 mg/dL — ABNORMAL HIGH (ref 8–23)
CO2: 25 mmol/L (ref 22–32)
Calcium: 8.6 mg/dL — ABNORMAL LOW (ref 8.9–10.3)
Chloride: 106 mmol/L (ref 98–111)
Creatinine, Ser: 2.04 mg/dL — ABNORMAL HIGH (ref 0.44–1.00)
GFR, Estimated: 23 mL/min — ABNORMAL LOW (ref >60)
Glucose, Bld: 108 mg/dL — ABNORMAL HIGH (ref 70–99)
Potassium: 4.7 mmol/L (ref 3.5–5.1)
Sodium: 138 mmol/L (ref 135–145)

## 2021-06-23 LAB — MAGNESIUM: Magnesium: 2.1 mg/dL (ref 1.7–2.4)

## 2021-06-23 MED ORDER — TRAMADOL HCL 50 MG PO TABS
50.0000 mg | ORAL_TABLET | Freq: Two times a day (BID) | ORAL | 0 refills | Status: DC | PRN
Start: 2021-06-23 — End: 2021-07-04

## 2021-06-23 MED ORDER — OXYCODONE HCL 5 MG PO TABS: 5.0000 mg | ORAL_TABLET | Freq: Three times a day (TID) | ORAL | 0 refills | Status: DC | PRN

## 2021-06-23 NOTE — Discharge Summary (Signed)
Physician Discharge Summary  ?Paula Mason TFT:[id number] DOB: [birth year] DOA: 06/21/2021 ? ?PCP: Monico Blitz, MD ? ?Admit date: 06/21/2021 ? ?Discharge date: 06/23/2021 ? ?Admitted From:Home ? ?Disposition:  SNF ? ?Recommendations for Outpatient Follow-up:  ?Follow up with PCP in 1-2 weeks ?Follow-up with orthopedics Dr. Aline Brochure outpatient in 6 weeks for repeat imaging ?Continue pain medications as provided and noted below ?Continue other home medications as prior ? ?Home Health: None ? ?Equipment/Devices: None ? ?Discharge Condition:Stable ? ?CODE STATUS: DNR ? ?Diet recommendation: Heart Healthy ? ?Brief/Interim Summary: ?Per HPI: ?Paula Mason  is a 88 y.o. female, with past medical history of paroxysmal A-fib, not on anticoagulation due to perforated duodenal ulcer status postsurgical repair in May 2021, hypertension, GERD, patient presents to ED secondary to complaints of fall, pain, patient lives at a retirement facility, she usually ambulates with a walker, she was walking to the dining hall yesterday, without her walker, she remembered that she left the walker at her room, and she started to go back to her room when she ended up stumbling and falling, she did hit the back/right side of her head, and started to have pain in her hips as well, she denies dizziness, lightheadedness, loss of consciousness, any focal deficits around her fall, she was trying to ambulate today with pain, as well she noted bruising on the back of her head which prompted her to come to ED for evaluation. ?-ED her work-up significant for creatinine of 1.9, baseline is 1.5-1.6, globin of 11.5, she will hip x-ray with suspicion of trochanteric fracture, so CT pelvis was obtained, with no evidence of hip fracture, but significant for pelvic fracture 3 places including right inferior pubic ramus, left superior pubic ramus, and left parasymphyseal pubic bone, and insufficiency fracture and right sacrum as well, [facility] hospitalist  consulted to admit. ?  ?06/22/21: Patient was admitted with fall secondary to pelvic fracture and pubic rami and sacral region.  No plans for operative intervention per orthopedics.  PT recommending SNF placement.  ? ?06/23/21: Patient is in stable condition for discharge today and has been seen by PT recommending SNF placement.  She has been prescribed medications to assist with pain control.  No other acute events noted throughout the course of this admission.  Orthopedics is recommending follow-up in 6 weeks. ? ?Discharge Diagnoses:  ?Principal Problem: ?  Pelvic fracture ([place]) ?Active Problems: ?  Fall ?  Atrial fibrillation, chronic ([place]) ?  Essential hypertension ?  CKD (chronic kidney disease) ?  Peptic ulcer ?  GERD (gastroesophageal reflux disease) ? ?Principal discharge diagnosis: Fall with pelvic fracture to bilateral pubic rami as well as sacrum. ? ?Discharge Instructions ? ?Discharge Instructions   ? ? Diet - low sodium heart healthy   Complete by: As directed ?  ? Increase activity slowly   Complete by: As directed ?  ? ?  ? ?Allergies as of 06/23/2021   ?No Known Allergies ?  ? ?  ?Medication List  ?  ? ?TAKE these medications   ? ?amiodarone 200 MG tablet ?Commonly known as: PACERONE ?Take 200 mg by mouth daily. ?  ?amLODipine 5 MG tablet ?Commonly known as: NORVASC ?Take 5 mg by mouth daily. ?  ?CENTRUM SILVER ADULT 50+ PO ?Take 1 tablet by mouth daily. ?  ?losartan 25 MG tablet ?Commonly known as: COZAAR ?Take 25 mg by mouth daily. ?  ?meloxicam 15 MG tablet ?Commonly known as: MOBIC ?Take 15 mg by mouth daily. ?  ?metoprolol succinate 25  MG 24 hr tablet ?Commonly known as: TOPROL-XL ?Take 12.5 mg by mouth daily. ?  ?oxyCODONE 5 MG immediate release tablet ?Commonly known as: Roxicodone ?Take 1 tablet (5 mg total) by mouth every 8 (eight) hours as needed for severe pain or breakthrough pain. ?  ?pantoprazole 40 MG tablet ?Commonly known as: PROTONIX ?Take 40 mg by mouth daily. ?  ?potassium chloride  10 MEQ CR capsule ?Commonly known as: MICRO-K ?Take 10 mEq by mouth daily. ?  ?traMADol 50 MG tablet ?Commonly known as: ULTRAM ?Take 1 tablet (50 mg total) by mouth 2 (two) times daily as needed for moderate pain. ?What changed: reasons to take this ?  ? ?  ? ? Follow-up Information   ? ? Monico Blitz, MD. Schedule an appointment as soon as possible for a visit in 1 week(s).   ?Specialty: Internal Medicine ?Contact information: ?[address]  ?[place] [address] ?[phone] ? ? ?  ?  ? ? Carole Civil, MD. Schedule an appointment as soon as possible for a visit in 6 week(s).   ?Specialties: Orthopedic Surgery, Radiology ?Contact information: ?[address] ?[place] [address] ?[phone] ? ? ?  ?  ? ?  ?  ? ?  ? ?No Known Allergies ? ?Consultations: ?Orthopedics ? ? ?Procedures/Studies: ?CT Head Wo Contrast ? ?Result Date: 06/21/2021 ?CLINICAL DATA:  Head trauma, minor (Age >= 65y); Neck trauma (Age >= 65y) EXAM: CT HEAD WITHOUT CONTRAST CT CERVICAL SPINE WITHOUT CONTRAST TECHNIQUE: Multidetector CT imaging of the head and cervical spine was performed following the standard protocol without intravenous contrast. Multiplanar CT image reconstructions of the cervical spine were also generated. RADIATION DOSE REDUCTION: This exam was performed according to the departmental dose-optimization program which includes automated exposure control, adjustment of the mA and/or kV according to patient size and/or use of iterative reconstruction technique. COMPARISON:  None. FINDINGS: CT HEAD FINDINGS Brain: No evidence of acute intracranial hemorrhage or extra-axial collection.No evidence of mass lesion/concerning mass effect.The ventricles are normal in size.Scattered subcortical and periventricular white matter hypodensities, nonspecific but likely sequela of chronic small vessel ischemic disease.Mild cerebral atrophy Vascular: No hyperdense vessel. Skull: Negative for skull fracture. Sinuses/Orbits: No acute  finding. Other: There is a small right-sided scalp hematoma. CT CERVICAL SPINE FINDINGS Alignment: Normal. Skull base and vertebrae: There is no evidence of acute cervical spine fracture. No aggressive osseous lesion. Soft tissues and spinal canal: No prevertebral fluid or swelling. No visible canal hematoma. Disc levels: Multilevel degenerative disc disease, most severe at C5-C6 and C6-C7. There is moderate severe multilevel facet arthropathy throughout. Upper chest: Negative Other: None. IMPRESSION: No acute intracranial abnormality. Small right-sided scalp hematoma. Chronic small vessel ischemic disease. No acute cervical spine fracture. Multilevel degenerative disc disease and facet arthropathy. Electronically Signed   By: Maurine Simmering M.D.   On: 06/21/2021 15:05  ? ?CT Cervical Spine Wo Contrast ? ?Result Date: 06/21/2021 ?CLINICAL DATA:  Head trauma, minor (Age >= 65y); Neck trauma (Age >= 65y) EXAM: CT HEAD WITHOUT CONTRAST CT CERVICAL SPINE WITHOUT CONTRAST TECHNIQUE: Multidetector CT imaging of the head and cervical spine was performed following the standard protocol without intravenous contrast. Multiplanar CT image reconstructions of the cervical spine were also generated. RADIATION DOSE REDUCTION: This exam was performed according to the departmental dose-optimization program which includes automated exposure control, adjustment of the mA and/or kV according to patient size and/or use of iterative reconstruction technique. COMPARISON:  None. FINDINGS: CT HEAD FINDINGS Brain: No evidence of acute  intracranial hemorrhage or extra-axial collection.No evidence of mass lesion/concerning mass effect.The ventricles are normal in size.Scattered subcortical and periventricular white matter hypodensities, nonspecific but likely sequela of chronic small vessel ischemic disease.Mild cerebral atrophy Vascular: No hyperdense vessel. Skull: Negative for skull fracture. Sinuses/Orbits: No acute finding. Other: There is a  small right-sided scalp hematoma. CT CERVICAL SPINE FINDINGS Alignment: Normal. Skull base and vertebrae: There is no evidence of acute cervical spine fracture. No aggressive osseous lesion. Soft tissues and sp

## 2021-06-23 NOTE — Care Management Important Message (Signed)
Important Message ? ?Patient Details  ?Name: Paula Mason ?MRN: [id number] ?Date of Birth: 1928/11/30 ? ? ?Medicare Important Message Given:  Yes ? ? ? ? ?Tommy Medal ?06/23/2021, 12:56 PM ?

## 2021-06-23 NOTE — TOC Transition Note (Signed)
Transition of Care (TOC) - CM/SW Discharge Note ? ? ?Patient Details  ?Name: Paula Mason ?MRN: [id number] ?Date of Birth: May 29, 1928 ? ?Transition of Care (TOC) CM/SW Contact:  ?Misty Rago D, LCSW ?Phone Number: ?06/23/2021, 12:41 PM ? ? ?Clinical Narrative:    ?Patient will d/c to [facility] (06/23/2021-06/27/2021, navi auth id [id number]).Marland Kitchen Discharge clinicals sent to facility. Family aware of discharge. TOC signing off.  ? ? ?Final next level of care: [place] ?Barriers to Discharge: Continued Medical Work up ? ? ?Patient Goals and CMS Choice ?Patient states their goals for this hospitalization and ongoing recovery are:: Rehab and home ?  ?  ? ?Discharge Placement ?  ?           ?  ?Patient to be transferred to facility by: staff ?Name of family member notified: Jacqlyn Larsen (nephew's wife) ?Patient and family notified of of transfer: 06/23/21 ? ?Discharge Plan and Services ?  ?  ?Post Acute Care Choice: [place]          ?  ?  ?  ?  ?  ?  ?  ?  ?  ?  ? ?Social Determinants of Health (SDOH) Interventions ?  ? ? ?Readmission Risk Interventions ?No flowsheet data found. ? ? ? ? ?

## 2021-06-24 LAB — SARS CORONAVIRUS 2 (TAT 6-24 HRS): SARS Coronavirus 2: NEGATIVE

## 2021-06-26 ENCOUNTER — Encounter: Payer: Self-pay | Admitting: Adult Health

## 2021-06-26 ENCOUNTER — Non-Acute Institutional Stay (SKILLED_NURSING_FACILITY): Payer: Medicare Other | Admitting: Adult Health

## 2021-06-26 DIAGNOSIS — K219 Gastro-esophageal reflux disease without esophagitis: Secondary | ICD-10-CM | POA: Diagnosis not present

## 2021-06-26 DIAGNOSIS — N184 Chronic kidney disease, stage 4 (severe): Secondary | ICD-10-CM

## 2021-06-26 DIAGNOSIS — I482 Chronic atrial fibrillation, unspecified: Secondary | ICD-10-CM | POA: Diagnosis not present

## 2021-06-26 DIAGNOSIS — S329XXS Fracture of unspecified parts of lumbosacral spine and pelvis, sequela: Secondary | ICD-10-CM | POA: Diagnosis not present

## 2021-06-26 DIAGNOSIS — I10 Essential (primary) hypertension: Secondary | ICD-10-CM

## 2021-06-26 NOTE — Progress Notes (Signed)
?Location:  [place] ?Nursing Home Room Number: 125-P ?Place of Service:  SNF (31) ? ? ?CODE STATUS: DNR ? ?No Known Allergies ? ?Chief Complaint  ?Patient presents with  ? Hospitalization Follow-up  ? ? ?HPI: ? ?She is a 88 year old woman who has been hospitalized from 06-21-21 through 06-23-21. Her medical history includes: afib; hypertension; GERD. She presented to the ED after a fall. She lives at a retirement center. She was walking to the dining hall without a walker; stumbled then fell. She did hit the right side of her head. Ct was obtained and was significant for pelvic fracture 3 placed: including right inferior pubic ramus; left superior pubic rami and left presymphyseal pubic bone and right sacrum. She is being admitted to this facility for short term rehab with her goal to return back to her retirement center. She will continue to be followed for her chronic illnesses including:    Atrial fibrillation, chronic:   Essential hypertension: Gastroesophageal reflux disease without esophagitis: Stage 4 chronic kidney disease: ? ? ? ?Past Medical History:  ?Diagnosis Date  ? Arthritis   ? GERD (gastroesophageal reflux disease)   ? Hypertension   ? ? ?Past Surgical History:  ?Procedure Laterality Date  ? ABDOMINAL HYSTERECTOMY    ? [place]  ? BREAST SURGERY Left   ? benign  ? CATARACT EXTRACTION W/PHACO Right 08/03/2013  ? Procedure: CATARACT EXTRACTION PHACO AND INTRAOCULAR LENS PLACEMENT (IOC);  Surgeon: Tonny Branch, MD;  Location: [facility];  Service: Ophthalmology;  Laterality: Right;  CDE 13.72  ? CHOLECYSTECTOMY    ? ? ?Social History  ? ?Socioeconomic History  ? Marital status: Single  ?  Spouse name: Not on file  ? Number of children: Not on file  ? Years of education: Not on file  ? Highest education level: Not on file  ?Occupational History  ? Not on file  ?Tobacco Use  ? Smoking status: Never  ? Smokeless tobacco: Never  ?Substance and Sexual Activity  ? Alcohol use: No  ? Drug use: No  ?  Sexual activity: Yes  ?  Birth control/protection: Surgical  ?Other Topics Concern  ? Not on file  ?Social History Narrative  ? ** Merged History Encounter **  ?    ? ?Social Determinants of Health  ? ?Financial Resource Strain: Not on file  ?Food Insecurity: Not on file  ?Transportation Needs: Not on file  ?Physical Activity: Not on file  ?Stress: Not on file  ?Social Connections: Not on file  ?Intimate Partner Violence: Not on file  ? ?History reviewed. No pertinent family history. ? ? ? ?VITAL SIGNS ?BP (!) 152/59   Pulse 85   Temp 97.9 ?F (36.6 ?C)   Wt 125 lb 12.8 oz (57.1 kg)   BMI 22.28 kg/m?  ? ?Outpatient Encounter Medications as of 06/26/2021  ?Medication Sig  ? amiodarone (PACERONE) 200 MG tablet Take 200 mg by mouth daily.  ? amLODipine (NORVASC) 5 MG tablet Take 5 mg by mouth daily.  ? losartan (COZAAR) 25 MG tablet Take 25 mg by mouth daily.  ? meloxicam (MOBIC) 15 MG tablet Take 15 mg by mouth daily.  ? metoprolol succinate (TOPROL-XL) 25 MG 24 hr tablet Take 12.5 mg by mouth daily.  ? omeprazole (PRILOSEC) 40 MG capsule Take 40 mg by mouth daily.  ? oxyCODONE (ROXICODONE) 5 MG immediate release tablet Take 1 tablet (5 mg total) by mouth every 8 (eight) hours as needed for severe pain or  breakthrough pain.  ? potassium chloride (MICRO-K) 10 MEQ CR capsule Take 10 mEq by mouth daily.  ? traMADol (ULTRAM) 50 MG tablet Take 1 tablet (50 mg total) by mouth 2 (two) times daily as needed for moderate pain.  ? ?No facility-administered encounter medications on file as of 06/26/2021.  ? ? ? ?SIGNIFICANT DIAGNOSTIC EXAMS ? ?TODAY ? ?06-21-21: right hip x-ray:  ?1. Acute nondisplaced fractures of the right inferior pubic ramus, left superior pubic ramus and left parasymphyseal pubic bone. ?2. Subacute or possibly acute fracture in the right sacrum, likely insufficiency fracture. ?3. Osteopenia and other findings as described. ? ?06-21-21: ct of head and cervical spine:  ?No acute intracranial abnormality. Small  right-sided scalp hematoma. Chronic small vessel ischemic disease. ?No acute cervical spine fracture. Multilevel degenerative disc disease and facet arthropathy ? ?LABS REVIEWED:  ? ?06-21-21 wbc 9.1; hgb 11.5; hct 34.6; mcv 100.6 plt 268; glucose 124; bun 38; creat 1.93; k+ 4.1; na++ 139; ca 8.6; GFR 24 ?06-22-21: vitamin B12: 506; folate 24.1 ?06-23-21: wbc 8.8; hgb 10.1; hct 30.1; mcv 101.7 plt 234; glucose 108; bun 37; creat 2.04; k+ 4.7; an++ 138; ca 8.6; GFR 32; mag 2.1  ? ?Review of Systems  ?Constitutional:  Negative for malaise/fatigue.  ?Respiratory:  Negative for cough and shortness of breath.   ?Cardiovascular:  Negative for chest pain, palpitations and leg swelling.  ?Gastrointestinal:  Negative for abdominal pain, constipation and heartburn.  ?Musculoskeletal:  Negative for back pain, joint pain and myalgias.  ?Skin: Negative.   ?Neurological:  Negative for dizziness.  ?Psychiatric/Behavioral:  The patient is not nervous/anxious.   ? ?Physical Exam ?Constitutional:   ?   General: She is not in acute distress. ?   Appearance: She is well-developed. She is not diaphoretic.  ?Neck:  ?   Thyroid: No thyromegaly.  ?Cardiovascular:  ?   Rate and Rhythm: Normal rate and regular rhythm.  ?   Pulses: Normal pulses.  ?   Heart sounds: Normal heart sounds.  ?Pulmonary:  ?   Effort: Pulmonary effort is normal. No respiratory distress.  ?   Breath sounds: Normal breath sounds.  ?Abdominal:  ?   General: Bowel sounds are normal. There is no distension.  ?   Palpations: Abdomen is soft.  ?   Tenderness: There is no abdominal tenderness.  ?Musculoskeletal:  ?   Cervical back: Neck supple.  ?   Right lower leg: No edema.  ?   Left lower leg: No edema.  ?   Comments: Able to move all extremities   ?Lymphadenopathy:  ?   Cervical: No cervical adenopathy.  ?Skin: ?   General: Skin is warm and dry.  ?Neurological:  ?   Mental Status: She is alert and oriented to person, place, and time.  ?Psychiatric:     ?   Mood and Affect:  Mood normal.  ? ? ? ?ASSESSMENT/ PLAN: ? ?TODAY ? ?Closed nondisplaced unspecified part of pelvis sequela: is stable will continue therapy as directed to improve upon her level of independence with her adls. Will follow up with orthopedics. Will continue ultram 50 mg twice daily as needed and has oxycodone 5 mg every 8 hours as needed will continue mobic 15 mg daily  ? ?2. Atrial fibrillation, chronic: is stable will continue amiodarone 200 mg daily and toprol xl 12.5 mg daily  for rate control. Is not on anticoagulation therapy due to perforated duodenum  ulcer. falls and advanced age.  ? ?3.  Essential  hypertension: b/p 152/59: will continue cozaar 25 mg daily and norvasc 5 mg daily; toprol xl 12.5 mg daily  ? ?4. Gastroesophageal reflux disease without esophagitis: is stable will continue prilosec 40 mg daily  ? ?5. Stage 4 chronic kidney disease: bun 37; creat 2.04 gfr 23.  ? ? ?Ok Edwards NP ?[facility]  ? [phone]  ? ?

## 2021-06-27 ENCOUNTER — Encounter: Payer: Self-pay | Admitting: Internal Medicine

## 2021-06-27 ENCOUNTER — Non-Acute Institutional Stay (SKILLED_NURSING_FACILITY): Payer: Medicare Other | Admitting: Internal Medicine

## 2021-06-27 DIAGNOSIS — I10 Essential (primary) hypertension: Secondary | ICD-10-CM

## 2021-06-27 DIAGNOSIS — N184 Chronic kidney disease, stage 4 (severe): Secondary | ICD-10-CM

## 2021-06-27 DIAGNOSIS — I482 Chronic atrial fibrillation, unspecified: Secondary | ICD-10-CM

## 2021-06-27 DIAGNOSIS — S32599G Other specified fracture of unspecified pubis, subsequent encounter for fracture with delayed healing: Secondary | ICD-10-CM

## 2021-06-27 NOTE — Assessment & Plan Note (Signed)
PT/OT at SNF as tolerated. ?

## 2021-06-27 NOTE — Assessment & Plan Note (Signed)
BP controlled; no change in antihypertensive medications  

## 2021-06-27 NOTE — Assessment & Plan Note (Addendum)
Clinically the rhythm is not dramatically irregular and rate is well controlled.  Anticoagulation not an option due to history of peptic ulcer disease. ?

## 2021-06-27 NOTE — Assessment & Plan Note (Addendum)
While hospitalized with pubic fractures creatinine varied from 1.93-2.04 and GFR from 23-24 indicating CKD stage IV.  Med list reviewed; Meloxicam contraindicated with GFR < 30 ?

## 2021-06-27 NOTE — Patient Instructions (Signed)
See assessment and plan under each diagnosis in the problem list and acutely for this visit 

## 2021-06-27 NOTE — Progress Notes (Signed)
? ?NURSING HOME LOCATION:  [facility] ?ROOM NUMBER: 125 P ? ?CODE STATUS: DNR ? ?PCP:  Monico Blitz MD ? ?This is a comprehensive admission note to this SNFperformed on this date less than 30 days from date of admission. ?Included are preadmission medical/surgical history; reconciled medication list; family history; social history and comprehensive review of systems.  ?Corrections and additions to the records were documented. Comprehensive physical exam was also performed. Additionally a clinical summary was entered for each active diagnosis pertinent to this admission in the Problem List to enhance continuity of care. ? ?HPI: She was hospitalized 3/15 - 06/23/2021 having fallen while ambulating without her walker.  She denied any cardiac or neurologic prodrome prior to the fall.  In the ED hip x-ray was initially suspicious for trochanteric fracture; but CT of the pelvis revealed no hip fracture but significant pelvic fracture @ several sites including the right inferior pubic ramus, left superior pubic ramus, and left parasymphyseal pubic bone and insufficiency fracture of the right sacrum.  Orthopedics recommended nonoperative management.  Labs revealed a creatinine 1.93 with GFR of 24.  Initial H/H was 11.5/34.6 with MCV of 100.6.  H/H dropped to 10.1/30.3 with an MCV of 101.7.  B12 was normal at 506; folate level was also normal.  At discharge creatinine was 2.04 with a GFR of 23. ?PT/OT recommended placement in SNF for rehab. ? ?Past medical and surgical history: Includes degenerative joint disease, GERD, essential hypertension, CKD, history of PUD, and paroxysmal  atrial fibrillation. ?Surgeries and procedures include breast biopsy, abdominal hysterectomy, and cholecystectomy. ? ?Social history: Nondrinker; non-smoker. ? ?Family history: Noncontributory due to advanced age. ?  ?Review of systems: She complains of ongoing pain in the right buttocks area.  She also has chronic constipation.   She states that she has had a problem with swelling of her feet without paroxysmal nocturnal dyspnea.  She also validates snoring without a diagnosis of sleep apnea. ? ?Constitutional: No fever, significant weight change, fatigue  ?Eyes: No redness, discharge, pain, vision change ?ENT/mouth: No nasal congestion, purulent discharge, earache, change in hearing, sore throat  ?Cardiovascular: No chest pain, palpitations, claudication  ?Respiratory: No cough, sputum production, hemoptysis, DOE ?Gastrointestinal: No heartburn, dysphagia, abdominal pain, nausea /vomiting, rectal bleeding, melena ?Genitourinary: No dysuria, hematuria, pyuria, incontinence, nocturia ?Dermatologic: No rash, pruritus, change in appearance of skin ?Neurologic: No dizziness, headache, syncope, seizures, numbness, tingling ?Psychiatric: No significant anxiety, depression, insomnia, anorexia ?Endocrine: No change in hair/skin/nails, excessive thirst, excessive hunger, excessive urination  ?Hematologic/lymphatic: No significant bruising, lymphadenopathy, abnormal bleeding ?Allergy/immunology: No itchy/watery eyes, significant sneezing, urticaria, angioedema ? ?Physical exam:  ?Pertinent or positive findings: Initially she was asleep without evidence of snoring or apnea.  Hair is disheveled.  She is hard of hearing.  Eyebrows and eyelashes are deficient.  The left nasolabial fold is decreased in intensity.  Dentition is immaculate.  She exhibits an intermittent tremor of the mandible.  Breath sounds are decreased.She has a flexion contracture of the fifth right digit due to cicatrixing of the ventral subcutaneous tissues, suggesting contraction of the flexor tendon.  Varus deformity is present at the knees.  There is faint plaque-like hyperpigmentation of the left cheek. ? ?General appearance: Adequately nourished; no acute distress, increased work of breathing is present.   ?Lymphatic: No lymphadenopathy about the head, neck, axilla. ?Eyes: No  conjunctival inflammation or lid edema is present. There is no scleral icterus. ?Ears:  External ear exam shows no significant lesions or deformities.   ?  Nose:  External nasal examination shows no deformity or inflammation. Nasal mucosa are pink and moist without lesions, exudates ?Oral exam: Lips and gums are healthy appearing.There is no oropharyngeal erythema or exudate. ?Neck:  No thyromegaly, masses, tenderness noted.    ?Heart:  Normal rate and regular rhythm. S1 and S2 normal without gallop, murmur, click, rub.  ?Lungs:  without wheezes, rhonchi, rales, rubs. ?Abdomen: Bowel sounds are normal.  Abdomen is soft and nontender with no organomegaly, hernias, masses. ?GU: Deferred  ?Extremities:  No cyanosis, clubbing, edema. ?Neurologic exam:  Balance, Rhomberg, finger to nose testing could not be completed due to clinical state ?Skin: Warm & dry w/o tenting. ?No significant or rash. ? ?See clinical summary under each active problem in the Problem List with associated updated therapeutic plan ? ?

## 2021-07-04 ENCOUNTER — Encounter: Payer: Self-pay | Admitting: Adult Health

## 2021-07-04 ENCOUNTER — Non-Acute Institutional Stay (SKILLED_NURSING_FACILITY): Payer: Medicare Other | Admitting: Adult Health

## 2021-07-04 DIAGNOSIS — S329XXS Fracture of unspecified parts of lumbosacral spine and pelvis, sequela: Secondary | ICD-10-CM

## 2021-07-04 DIAGNOSIS — N184 Chronic kidney disease, stage 4 (severe): Secondary | ICD-10-CM | POA: Diagnosis not present

## 2021-07-04 NOTE — Progress Notes (Deleted)
?Location:  [place] ?Nursing Home Room Number: 196 ?Place of Service:  SNF (31) ?Provider:  Ok Edwards, NP ? ? ?CODE STATUS: DNR ? ?No Known Allergies ? ?Chief Complaint  ?Patient presents with  ? Acute Visit  ?  Pain management  ? ? ?HPI: ? ?She has a fractured her pelvis. She has been having pain. She has completed the indicated length of time for her ultram. She also has  ? ?Past Medical History:  ?Diagnosis Date  ? Arthritis   ? GERD (gastroesophageal reflux disease)   ? Hypertension   ? ? ?Past Surgical History:  ?Procedure Laterality Date  ? ABDOMINAL HYSTERECTOMY    ? [place]  ? BREAST SURGERY Left   ? benign  ? CATARACT EXTRACTION W/PHACO Right 08/03/2013  ? Procedure: CATARACT EXTRACTION PHACO AND INTRAOCULAR LENS PLACEMENT (IOC);  Surgeon: Tonny Branch, MD;  Location: [facility];  Service: Ophthalmology;  Laterality: Right;  CDE 13.72  ? CHOLECYSTECTOMY    ? ? ?Social History  ? ?Socioeconomic History  ? Marital status: Single  ?  Spouse name: Not on file  ? Number of children: Not on file  ? Years of education: Not on file  ? Highest education level: Not on file  ?Occupational History  ? Not on file  ?Tobacco Use  ? Smoking status: Never  ? Smokeless tobacco: Never  ?Substance and Sexual Activity  ? Alcohol use: No  ? Drug use: No  ? Sexual activity: Yes  ?  Birth control/protection: Surgical  ?Other Topics Concern  ? Not on file  ?Social History Narrative  ? ** Merged History Encounter **  ?    ? ?Social Determinants of Health  ? ?Financial Resource Strain: Not on file  ?Food Insecurity: Not on file  ?Transportation Needs: Not on file  ?Physical Activity: Not on file  ?Stress: Not on file  ?Social Connections: Not on file  ?Intimate Partner Violence: Not on file  ? ?No family history on file. ? ? ? ?VITAL SIGNS ?BP (!) 113/50   Pulse 71   Temp 97.7 ?F (36.5 ?C)   Resp 17   Ht 5\' 3"  (1.6 m)   Wt 124 lb 3.2 oz (56.3 kg)   SpO2 99%   BMI 22.00 kg/m?  ? ?Outpatient Encounter Medications as  of 07/04/2021  ?Medication Sig  ? acetaminophen (ACETAMINOPHEN 8 HOUR) 650 MG CR tablet Take 650 mg by mouth every 6 (six) hours.  ? amiodarone (PACERONE) 200 MG tablet Take 200 mg by mouth daily.  ? amLODipine (NORVASC) 5 MG tablet Take 5 mg by mouth daily.  ? Lidocaine 4 % PTCH Apply 2 patches topically. to lower back daily for pain management.  ? losartan (COZAAR) 25 MG tablet Take 25 mg by mouth daily.  ? metoprolol succinate (TOPROL-XL) 25 MG 24 hr tablet Take 12.5 mg by mouth daily.  ? omeprazole (PRILOSEC) 40 MG capsule Take 40 mg by mouth daily.  ? potassium chloride (MICRO-K) 10 MEQ CR capsule Take 10 mEq by mouth daily.  ? [DISCONTINUED] meloxicam (MOBIC) 15 MG tablet Take 15 mg by mouth daily.  ? [DISCONTINUED] oxyCODONE (ROXICODONE) 5 MG immediate release tablet Take 1 tablet (5 mg total) by mouth every 8 (eight) hours as needed for severe pain or breakthrough pain.  ? [DISCONTINUED] traMADol (ULTRAM) 50 MG tablet Take 1 tablet (50 mg total) by mouth 2 (two) times daily as needed for moderate pain.  ? ?No facility-administered encounter medications on file as of  07/04/2021.  ? ? ? ?SIGNIFICANT DIAGNOSTIC EXAMS ? ? ? ? ? ? ?ASSESSMENT/ PLAN: ? ? ? ? ?Ok Edwards NP ?[facility]  ?Contact [phone] Monday through Friday 8am- 5pm  ?After hours call [phone]  ? ?

## 2021-07-06 ENCOUNTER — Encounter: Payer: Self-pay | Admitting: Adult Health

## 2021-07-06 ENCOUNTER — Non-Acute Institutional Stay (SKILLED_NURSING_FACILITY): Payer: Medicare Other | Admitting: Adult Health

## 2021-07-06 ENCOUNTER — Other Ambulatory Visit: Payer: Self-pay | Admitting: Adult Health

## 2021-07-06 DIAGNOSIS — S329XXS Fracture of unspecified parts of lumbosacral spine and pelvis, sequela: Secondary | ICD-10-CM

## 2021-07-06 MED ORDER — TRAMADOL HCL 50 MG PO TABS: 50.0000 mg | ORAL_TABLET | Freq: Two times a day (BID) | ORAL | 0 refills | Status: DC

## 2021-07-06 NOTE — Progress Notes (Signed)
? ?Location:  [place] ?Nursing Home Room Number: 751 ?Place of Service:  SNF (31) ? ? ?CODE STATUS: full code  ? ?No Known Allergies ? ?Chief Complaint  ?Patient presents with  ? Acute Visit  ?  Pain management  ? ? ?HPI: ? ?She has a pelvic fracture. She continues to have pain present. The time for her ultram has expired. I have spoken with her regarding her pain management. We discussed continued use of narcotics and the dangers that that can cause. She has verbalized understanding. She has stage 4 renal failure. She will need to have her mobic stopped due to her renal failure. She is willing to try routine tylenol and lidoderm 4% patches.  ? ?Past Medical History:  ?Diagnosis Date  ? Arthritis   ? GERD (gastroesophageal reflux disease)   ? Hypertension   ? ? ?Past Surgical History:  ?Procedure Laterality Date  ? ABDOMINAL HYSTERECTOMY    ? [place]  ? BREAST SURGERY Left   ? benign  ? CATARACT EXTRACTION W/PHACO Right 08/03/2013  ? Procedure: CATARACT EXTRACTION PHACO AND INTRAOCULAR LENS PLACEMENT (IOC);  Surgeon: Tonny Branch, MD;  Location: [facility];  Service: Ophthalmology;  Laterality: Right;  CDE 13.72  ? CHOLECYSTECTOMY    ? ? ?Social History  ? ?Socioeconomic History  ? Marital status: Single  ?  Spouse name: Not on file  ? Number of children: Not on file  ? Years of education: Not on file  ? Highest education level: Not on file  ?Occupational History  ? Not on file  ?Tobacco Use  ? Smoking status: Never  ? Smokeless tobacco: Never  ?Substance and Sexual Activity  ? Alcohol use: No  ? Drug use: No  ? Sexual activity: Yes  ?  Birth control/protection: Surgical  ?Other Topics Concern  ? Not on file  ?Social History Narrative  ? ** Merged History Encounter **  ?    ? ?Social Determinants of Health  ? ?Financial Resource Strain: Not on file  ?Food Insecurity: Not on file  ?Transportation Needs: Not on file  ?Physical Activity: Not on file  ?Stress: Not on file  ?Social Connections: Not on file   ?Intimate Partner Violence: Not on file  ? ?No family history on file. ? ? ? ?VITAL SIGNS ?BP (!) 113/50   Pulse 71   Temp 97.7 ?F (36.5 ?C)   Resp 17   Ht 5\' 3"  (1.6 m)   Wt 124 lb 3.2 oz (56.3 kg)   SpO2 99%   BMI 22.00 kg/m?  ? ?Outpatient Encounter Medications as of 07/04/2021  ?Medication Sig  ? acetaminophen (TYLENOL) 650 MG CR tablet Take 650 mg by mouth every 6 (six) hours.  ? amiodarone (PACERONE) 200 MG tablet Take 200 mg by mouth daily.  ? amLODipine (NORVASC) 5 MG tablet Take 5 mg by mouth daily.  ? Lidocaine 4 % PTCH Apply 2 patches topically. to lower back daily for pain management.  ? losartan (COZAAR) 25 MG tablet Take 25 mg by mouth daily.  ? metoprolol succinate (TOPROL-XL) 25 MG 24 hr tablet Take 12.5 mg by mouth daily.  ? omeprazole (PRILOSEC) 40 MG capsule Take 40 mg by mouth daily.  ? potassium chloride (MICRO-K) 10 MEQ CR capsule Take 10 mEq by mouth daily.  ? [DISCONTINUED] meloxicam (MOBIC) 15 MG tablet Take 15 mg by mouth daily.  ? [DISCONTINUED] oxyCODONE (ROXICODONE) 5 MG immediate release tablet Take 1 tablet (5 mg total) by mouth every 8 (  eight) hours as needed for severe pain or breakthrough pain.  ? [DISCONTINUED] traMADol (ULTRAM) 50 MG tablet Take 1 tablet (50 mg total) by mouth 2 (two) times daily as needed for moderate pain.  ? ?No facility-administered encounter medications on file as of 07/04/2021.  ? ? ? ?SIGNIFICANT DIAGNOSTIC EXAMS ? ?PREVIOUS  ? ?06-21-21: right hip x-ray:  ?1. Acute nondisplaced fractures of the right inferior pubic ramus, left superior pubic ramus and left parasymphyseal pubic bone. ?2. Subacute or possibly acute fracture in the right sacrum, likely insufficiency fracture. ?3. Osteopenia and other findings as described. ? ?06-21-21: ct of head and cervical spine:  ?No acute intracranial abnormality. Small right-sided scalp hematoma. Chronic small vessel ischemic disease. ?No acute cervical spine fracture. Multilevel degenerative disc disease and facet  arthropathy ? ?NO NEW EXAMS  ? ?LABS REVIEWED: PREVIOUS  ? ?06-21-21 wbc 9.1; hgb 11.5; hct 34.6; mcv 100.6 plt 268; glucose 124; bun 38; creat 1.93; k+ 4.1; na++ 139; ca 8.6; GFR 24 ?06-22-21: vitamin B12: 506; folate 24.1 ?06-23-21: wbc 8.8; hgb 10.1; hct 30.1; mcv 101.7 plt 234; glucose 108; bun 37; creat 2.04; k+ 4.7; na++ 138; ca 8.6; GFR 32; mag 2.1  ? ?NO NEW LABS.  ? ?Review of Systems  ?Constitutional:  Negative for malaise/fatigue.  ?Respiratory:  Negative for cough and shortness of breath.   ?Cardiovascular:  Negative for chest pain, palpitations and leg swelling.  ?Gastrointestinal:  Negative for abdominal pain, constipation and heartburn.  ?Musculoskeletal:  Positive for back pain. Negative for joint pain and myalgias.  ?Skin: Negative.   ?Neurological:  Negative for dizziness.  ?Psychiatric/Behavioral:  The patient is not nervous/anxious.   ? ? ?Physical Exam ?Constitutional:   ?   General: She is not in acute distress. ?   Appearance: She is well-developed. She is not diaphoretic.  ?Neck:  ?   Thyroid: No thyromegaly.  ?Cardiovascular:  ?   Rate and Rhythm: Normal rate and regular rhythm.  ?   Pulses: Normal pulses.  ?   Heart sounds: Normal heart sounds.  ?Pulmonary:  ?   Effort: Pulmonary effort is normal. No respiratory distress.  ?   Breath sounds: Normal breath sounds.  ?Abdominal:  ?   General: Bowel sounds are normal. There is no distension.  ?   Palpations: Abdomen is soft.  ?   Tenderness: There is no abdominal tenderness.  ?Musculoskeletal:     ?   General: Normal range of motion.  ?   Cervical back: Neck supple.  ?   Right lower leg: No edema.  ?   Left lower leg: No edema.  ?Lymphadenopathy:  ?   Cervical: No cervical adenopathy.  ?Skin: ?   General: Skin is warm and dry.  ?Neurological:  ?   Mental Status: She is alert and oriented to person, place, and time.  ?Psychiatric:     ?   Mood and Affect: Mood normal.  ? ? ? ?ASSESSMENT/ PLAN: ? ?TODAY ? ?Closed nondisplaced fracture of pelvis  unspecified part of pelvis sequela: ? Stage 4 chronic kidney disease  ? ?Will begin tylenol cr every 6 hours ?Lidocaine 4% patch #2 to back daily ?Will stop ultram and mobic  ? ? ?Ok Edwards NP ?[facility]  ?call [phone]  ? ?

## 2021-07-06 NOTE — Progress Notes (Signed)
?Location:  [place] ?Nursing Home Room Number: 125-P ?Place of Service:  SNF (31) ? ? ?CODE STATUS: DNR ? ?No Known Allergies ? ?Chief Complaint  ?Patient presents with  ? Acute Visit  ?  Pain management   ? ? ?HPI: ? ?She has had a multiple pelvic fractures due to fall. She had completed her treatment of ultram and has been changed to tylenol and lidoderm patches. She is not getting adequate pain relief. She did have a bad night yesterday. We have spoken about her pain relief. Will need to restart ultram for couple of more weeks.  ? ?Past Medical History:  ?Diagnosis Date  ? Arthritis   ? GERD (gastroesophageal reflux disease)   ? Hypertension   ? ? ?Past Surgical History:  ?Procedure Laterality Date  ? ABDOMINAL HYSTERECTOMY    ? [place]  ? BREAST SURGERY Left   ? benign  ? CATARACT EXTRACTION W/PHACO Right 08/03/2013  ? Procedure: CATARACT EXTRACTION PHACO AND INTRAOCULAR LENS PLACEMENT (IOC);  Surgeon: Tonny Branch, MD;  Location: [facility];  Service: Ophthalmology;  Laterality: Right;  CDE 13.72  ? CHOLECYSTECTOMY    ? ? ?Social History  ? ?Socioeconomic History  ? Marital status: Single  ?  Spouse name: Not on file  ? Number of children: Not on file  ? Years of education: Not on file  ? Highest education level: Not on file  ?Occupational History  ? Not on file  ?Tobacco Use  ? Smoking status: Never  ? Smokeless tobacco: Never  ?Substance and Sexual Activity  ? Alcohol use: No  ? Drug use: No  ? Sexual activity: Yes  ?  Birth control/protection: Surgical  ?Other Topics Concern  ? Not on file  ?Social History Narrative  ? ** Merged History Encounter **  ?    ? ?Social Determinants of Health  ? ?Financial Resource Strain: Not on file  ?Food Insecurity: Not on file  ?Transportation Needs: Not on file  ?Physical Activity: Not on file  ?Stress: Not on file  ?Social Connections: Not on file  ?Intimate Partner Violence: Not on file  ? ?History reviewed. No pertinent family history. ? ? ? ?VITAL SIGNS ?BP (!)  113/50   Pulse 71   Temp 97.7 ?F (36.5 ?C)   Resp 17   Ht 5\' 3"  (1.6 m)   Wt 124 lb 3.2 oz (56.3 kg)   SpO2 99%   BMI 22.00 kg/m?  ? ?Outpatient Encounter Medications as of 07/06/2021  ?Medication Sig  ? acetaminophen (TYLENOL) 650 MG CR tablet Take 650 mg by mouth every 6 (six) hours.  ? amiodarone (PACERONE) 200 MG tablet Take 200 mg by mouth daily.  ? amLODipine (NORVASC) 5 MG tablet Take 5 mg by mouth daily.  ? Lidocaine 4 % PTCH Apply 2 patches topically. to lower back daily for pain management.  ? losartan (COZAAR) 25 MG tablet Take 25 mg by mouth daily.  ? Magnesium Hydroxide (MILK OF MAGNESIA PO) Take by mouth. 30 ml by mouth daily as needed for constipation.  ? metoprolol succinate (TOPROL-XL) 25 MG 24 hr tablet Take 12.5 mg by mouth daily.  ? omeprazole (PRILOSEC) 40 MG capsule Take 40 mg by mouth daily.  ? potassium chloride (MICRO-K) 10 MEQ CR capsule Take 10 mEq by mouth daily.  ? traMADol (ULTRAM) 50 MG tablet Take 1 tablet (50 mg total) by mouth 2 (two) times daily. AND daily as needed for breakthrough pain  ? ?No facility-administered encounter medications  on file as of 07/06/2021.  ? ? ? ?SIGNIFICANT DIAGNOSTIC EXAMS ?PREVIOUS  ?  ?06-21-21: right hip x-ray:  ?1. Acute nondisplaced fractures of the right inferior pubic ramus, left superior pubic ramus and left parasymphyseal pubic bone. ?2. Subacute or possibly acute fracture in the right sacrum, likely insufficiency fracture. ?3. Osteopenia and other findings as described. ?  ?06-21-21: ct of head and cervical spine:  ?No acute intracranial abnormality. Small right-sided scalp hematoma. Chronic small vessel ischemic disease. ?No acute cervical spine fracture. Multilevel degenerative disc disease and facet arthropathy ?  ?NO NEW EXAMS  ?  ?LABS REVIEWED: PREVIOUS  ?  ?06-21-21 wbc 9.1; hgb 11.5; hct 34.6; mcv 100.6 plt 268; glucose 124; bun 38; creat 1.93; k+ 4.1; na++ 139; ca 8.6; GFR 24 ?06-22-21: vitamin B12: 506; folate 24.1 ?06-23-21: wbc 8.8;  hgb 10.1; hct 30.1; mcv 101.7 plt 234; glucose 108; bun 37; creat 2.04; k+ 4.7; na++ 138; ca 8.6; GFR 32; mag 2.1  ?  ?NO NEW LABS.  ? ?Review of Systems  ?Constitutional:  Negative for malaise/fatigue.  ?Respiratory:  Negative for cough and shortness of breath.   ?Cardiovascular:  Negative for chest pain, palpitations and leg swelling.  ?Gastrointestinal:  Negative for abdominal pain, constipation and heartburn.  ?Musculoskeletal:  Positive for back pain and joint pain. Negative for myalgias.  ?Skin: Negative.   ?Neurological:  Negative for dizziness.  ?Psychiatric/Behavioral:  The patient is not nervous/anxious.   ? ?Physical Exam ?Constitutional:   ?   General: She is not in acute distress. ?   Appearance: She is well-developed. She is not diaphoretic.  ?Neck:  ?   Thyroid: No thyromegaly.  ?Cardiovascular:  ?   Rate and Rhythm: Normal rate and regular rhythm.  ?   Pulses: Normal pulses.  ?   Heart sounds: Normal heart sounds.  ?Pulmonary:  ?   Effort: Pulmonary effort is normal. No respiratory distress.  ?   Breath sounds: Normal breath sounds.  ?Abdominal:  ?   General: Bowel sounds are normal. There is no distension.  ?   Palpations: Abdomen is soft.  ?   Tenderness: There is no abdominal tenderness.  ?Musculoskeletal:     ?   General: Normal range of motion.  ?   Cervical back: Neck supple.  ?   Right lower leg: No edema.  ?   Left lower leg: No edema.  ?Lymphadenopathy:  ?   Cervical: No cervical adenopathy.  ?Skin: ?   General: Skin is warm and dry.  ?Neurological:  ?   Mental Status: She is alert and oriented to person, place, and time.  ?Psychiatric:     ?   Mood and Affect: Mood normal.  ? ? ? ? ?ASSESSMENT/ PLAN: ? ?TODAY ? ?Closed nondisplaced fracture of pelvis unspecified part of fracture sequela:  ? ?Will restart ultram 50 mg twice daily and daily prn will monitor her status.  ? ? ?Ok Edwards NP ?[facility]  ?call [phone]  ? ?

## 2021-07-07 ENCOUNTER — Encounter: Payer: Self-pay | Admitting: Adult Health

## 2021-07-07 ENCOUNTER — Non-Acute Institutional Stay (SKILLED_NURSING_FACILITY): Payer: Medicare Other | Admitting: Adult Health

## 2021-07-07 ENCOUNTER — Other Ambulatory Visit: Payer: Self-pay | Admitting: Adult Health

## 2021-07-07 DIAGNOSIS — K279 Peptic ulcer, site unspecified, unspecified as acute or chronic, without hemorrhage or perforation: Secondary | ICD-10-CM

## 2021-07-07 DIAGNOSIS — I482 Chronic atrial fibrillation, unspecified: Secondary | ICD-10-CM | POA: Diagnosis not present

## 2021-07-07 DIAGNOSIS — S329XXS Fracture of unspecified parts of lumbosacral spine and pelvis, sequela: Secondary | ICD-10-CM

## 2021-07-07 MED ORDER — TRAMADOL HCL 50 MG PO TABS: 50.0000 mg | ORAL_TABLET | Freq: Two times a day (BID) | ORAL | 0 refills | Status: DC

## 2021-07-07 MED ORDER — LOSARTAN POTASSIUM 25 MG PO TABS: 25.0000 mg | ORAL_TABLET | Freq: Every day | ORAL | 0 refills | Status: DC

## 2021-07-07 MED ORDER — METOPROLOL SUCCINATE ER 25 MG PO TB24
12.5000 mg | ORAL_TABLET | Freq: Every day | ORAL | 0 refills | Status: AC
Start: 2021-07-07 — End: ?

## 2021-07-07 MED ORDER — AMIODARONE HCL 200 MG PO TABS: 200.0000 mg | ORAL_TABLET | Freq: Every day | ORAL | 0 refills | Status: AC

## 2021-07-07 MED ORDER — AMLODIPINE BESYLATE 5 MG PO TABS: 5.0000 mg | ORAL_TABLET | Freq: Every day | ORAL | 0 refills | Status: AC

## 2021-07-07 MED ORDER — OMEPRAZOLE 40 MG PO CPDR: 40.0000 mg | DELAYED_RELEASE_CAPSULE | Freq: Every day | ORAL | 0 refills | Status: AC

## 2021-07-07 MED ORDER — POTASSIUM CHLORIDE ER 10 MEQ PO CPCR: 10.0000 meq | ORAL_CAPSULE | Freq: Every day | ORAL | 0 refills | Status: AC

## 2021-07-07 NOTE — Progress Notes (Signed)
?Location:  [place] ?Nursing Home Room Number: 125-P ?Place of Service:  SNF (31) ? ? ?CODE STATUS: DNR ? ?No Known Allergies ? ?Chief Complaint  ?Patient presents with  ? Acute Visit  ?  Care plan meeting  ? ? ?HPI: ? ?We have come together for her care plan meeting. Family present. BIMS 13/15 mood 2/30: trouble sleeping problem with appetite. She requires supervision to limited assist with her adls. Has occasional bladder incontinence; and frequently incontinent of bowel. Dietary: weight is 124.2 pounds stable regular diet good appetite. Therapy: bed mobility mod to independent; sit stand: supervision with rolling walker supervision; has walker in room: upper body mod assist lower body: contact guard; toileting contact guard. Her goal is to return to her retirement center. She continues to be followed for her chronic illnesses including:  Closed nondisplaced fracture of pelvis of unspecified part of pelvis sequel  Atrial fibrillation chronic Peptic ulcer ? ?Past Medical History:  ?Diagnosis Date  ? Arthritis   ? GERD (gastroesophageal reflux disease)   ? Hypertension   ? ? ?Past Surgical History:  ?Procedure Laterality Date  ? ABDOMINAL HYSTERECTOMY    ? [place]  ? BREAST SURGERY Left   ? benign  ? CATARACT EXTRACTION W/PHACO Right 08/03/2013  ? Procedure: CATARACT EXTRACTION PHACO AND INTRAOCULAR LENS PLACEMENT (IOC);  Surgeon: Tonny Branch, MD;  Location: [facility];  Service: Ophthalmology;  Laterality: Right;  CDE 13.72  ? CHOLECYSTECTOMY    ? ? ?Social History  ? ?Socioeconomic History  ? Marital status: Single  ?  Spouse name: Not on file  ? Number of children: Not on file  ? Years of education: Not on file  ? Highest education level: Not on file  ?Occupational History  ? Not on file  ?Tobacco Use  ? Smoking status: Never  ? Smokeless tobacco: Never  ?Substance and Sexual Activity  ? Alcohol use: No  ? Drug use: No  ? Sexual activity: Yes  ?  Birth control/protection: Surgical  ?Other Topics Concern   ? Not on file  ?Social History Narrative  ? ** Merged History Encounter **  ?    ? ?Social Determinants of Health  ? ?Financial Resource Strain: Not on file  ?Food Insecurity: Not on file  ?Transportation Needs: Not on file  ?Physical Activity: Not on file  ?Stress: Not on file  ?Social Connections: Not on file  ?Intimate Partner Violence: Not on file  ? ?History reviewed. No pertinent family history. ? ? ? ?VITAL SIGNS ?BP (!) 113/50   Pulse 71   Temp 97.7 ?F (36.5 ?C)   Resp 17   Ht 5\' 3"  (1.6 m)   Wt 124 lb 3.2 oz (56.3 kg)   SpO2 99%   BMI 22.00 kg/m?  ? ?Outpatient Encounter Medications as of 07/07/2021  ?Medication Sig  ? acetaminophen (TYLENOL) 650 MG CR tablet Take 650 mg by mouth every 6 (six) hours.  ? amiodarone (PACERONE) 200 MG tablet Take 200 mg by mouth daily.  ? amLODipine (NORVASC) 5 MG tablet Take 5 mg by mouth daily.  ? Lidocaine 4 % PTCH Apply 2 patches topically. to lower back daily for pain management.  ? losartan (COZAAR) 25 MG tablet Take 25 mg by mouth daily.  ? Magnesium Hydroxide (MILK OF MAGNESIA PO) Take by mouth. 30 ml by mouth daily as needed for constipation.  ? metoprolol succinate (TOPROL-XL) 25 MG 24 hr tablet Take 12.5 mg by mouth daily.  ? NON FORMULARY Diet:Regular  ?  omeprazole (PRILOSEC) 40 MG capsule Take 40 mg by mouth daily.  ? potassium chloride (MICRO-K) 10 MEQ CR capsule Take 10 mEq by mouth daily.  ? traMADol (ULTRAM) 50 MG tablet Take 1 tablet (50 mg total) by mouth 2 (two) times daily. AND daily as needed for breakthrough pain  ? ?No facility-administered encounter medications on file as of 07/07/2021.  ? ? ? ?SIGNIFICANT DIAGNOSTIC EXAMS ? ?PREVIOUS  ?  ?06-21-21: right hip x-ray:  ?1. Acute nondisplaced fractures of the right inferior pubic ramus, left superior pubic ramus and left parasymphyseal pubic bone. ?2. Subacute or possibly acute fracture in the right sacrum, likely insufficiency fracture. ?3. Osteopenia and other findings as described. ?  ?06-21-21: ct  of head and cervical spine:  ?No acute intracranial abnormality. Small right-sided scalp hematoma. Chronic small vessel ischemic disease. ?No acute cervical spine fracture. Multilevel degenerative disc disease and facet arthropathy ?  ?NO NEW EXAMS  ?  ?LABS REVIEWED: PREVIOUS  ?  ?06-21-21 wbc 9.1; hgb 11.5; hct 34.6; mcv 100.6 plt 268; glucose 124; bun 38; creat 1.93; k+ 4.1; na++ 139; ca 8.6; GFR 24 ?06-22-21: vitamin B12: 506; folate 24.1 ?06-23-21: wbc 8.8; hgb 10.1; hct 30.1; mcv 101.7 plt 234; glucose 108; bun 37; creat 2.04; k+ 4.7; na++ 138; ca 8.6; GFR 32; mag 2.1  ?  ?NO NEW LABS. ? ?Review of Systems  ?Constitutional:  Negative for malaise/fatigue.  ?Respiratory:  Negative for cough and shortness of breath.   ?Cardiovascular:  Negative for chest pain, palpitations and leg swelling.  ?Gastrointestinal:  Negative for abdominal pain, constipation and heartburn.  ?Musculoskeletal:  Positive for back pain and joint pain. Negative for myalgias.  ?Skin: Negative.   ?Neurological:  Negative for dizziness.  ?Psychiatric/Behavioral:  The patient is not nervous/anxious.   ? ?Physical Exam ?Constitutional:   ?   General: She is not in acute distress. ?   Appearance: She is well-developed. She is not diaphoretic.  ?Neck:  ?   Thyroid: No thyromegaly.  ?Cardiovascular:  ?   Rate and Rhythm: Normal rate and regular rhythm.  ?   Pulses: Normal pulses.  ?   Heart sounds: Normal heart sounds.  ?Pulmonary:  ?   Effort: Pulmonary effort is normal. No respiratory distress.  ?   Breath sounds: Normal breath sounds.  ?Abdominal:  ?   General: Bowel sounds are normal. There is no distension.  ?   Palpations: Abdomen is soft.  ?   Tenderness: There is no abdominal tenderness.  ?Musculoskeletal:     ?   General: Normal range of motion.  ?   Cervical back: Neck supple.  ?   Right lower leg: No edema.  ?   Left lower leg: No edema.  ?Lymphadenopathy:  ?   Cervical: No cervical adenopathy.  ?Skin: ?   General: Skin is warm and dry.   ?Neurological:  ?   Mental Status: She is alert and oriented to person, place, and time.  ?Psychiatric:     ?   Mood and Affect: Mood normal.  ? ? ? ? ?ASSESSMENT/ PLAN: ? ?TODAY ? ?Closed nondisplaced fracture of pelvis of unspecified part of pelvis sequela ?Atrial fibrillation chronic  ?Peptic ulcer ? ?Will continue therapy as directed ?Will continue current medications ?Will continue to monitor her status ?The goal is to return back to her retirement center ? ? ?Time spent with patient: 40 minutes: therapy goals of care medications.  ? ? ?Ok Edwards NP ?[facility]  ?  call [phone]  ? ?

## 2021-07-10 ENCOUNTER — Encounter: Payer: Self-pay | Admitting: Adult Health

## 2021-07-10 ENCOUNTER — Other Ambulatory Visit (HOSPITAL_COMMUNITY)
Admission: RE | Admit: 2021-07-10 | Discharge: 2021-07-10 | Disposition: A | Discharge status: Home / Self Care | Payer: Medicare Other | Source: Skilled Nursing Facility | Attending: Internal Medicine | Admitting: Internal Medicine

## 2021-07-10 ENCOUNTER — Non-Acute Institutional Stay (SKILLED_NURSING_FACILITY): Payer: Medicare Other | Admitting: Adult Health

## 2021-07-10 DIAGNOSIS — U071 COVID-19: Secondary | ICD-10-CM | POA: Insufficient documentation

## 2021-07-10 LAB — D-DIMER, QUANTITATIVE: D-Dimer, Quant: 4.34 ug/mL-FEU — ABNORMAL HIGH (ref 0.00–0.50)

## 2021-07-10 LAB — BASIC METABOLIC PANEL
Anion gap: 8 (ref 5–15)
BUN: 24 mg/dL — ABNORMAL HIGH (ref 8–23)
CO2: 23 mmol/L (ref 22–32)
Calcium: 8.6 mg/dL — ABNORMAL LOW (ref 8.9–10.3)
Chloride: 103 mmol/L (ref 98–111)
Creatinine, Ser: 1.58 mg/dL — ABNORMAL HIGH (ref 0.44–1.00)
GFR, Estimated: 31 mL/min — ABNORMAL LOW (ref >60)
Glucose, Bld: 109 mg/dL — ABNORMAL HIGH (ref 70–99)
Potassium: 4.2 mmol/L (ref 3.5–5.1)
Sodium: 134 mmol/L — ABNORMAL LOW (ref 135–145)

## 2021-07-10 LAB — CBC
HCT: 32.4 % — ABNORMAL LOW (ref 36.0–46.0)
Hemoglobin: 11.1 g/dL — ABNORMAL LOW (ref 12.0–15.0)
MCH: 33.8 pg (ref 26.0–34.0)
MCHC: 34.3 g/dL (ref 30.0–36.0)
MCV: 98.8 fL (ref 80.0–100.0)
Platelets: 415 10*3/uL — ABNORMAL HIGH (ref 150–400)
RBC: 3.28 MIL/uL — ABNORMAL LOW (ref 3.87–5.11)
RDW: 14.4 % (ref 11.5–15.5)
WBC: 9.3 10*3/uL (ref 4.0–10.5)
nRBC: 0 % (ref 0.0–0.2)

## 2021-07-10 LAB — SARS CORONAVIRUS 2 BY RT PCR (HOSPITAL ORDER, PERFORMED IN ~~LOC~~ HOSPITAL LAB): SARS Coronavirus 2: POSITIVE — AB

## 2021-07-10 LAB — C-REACTIVE PROTEIN: CRP: 8.1 mg/dL — ABNORMAL HIGH (ref <1.0)

## 2021-07-10 NOTE — Progress Notes (Signed)
?Location:  [place] Room Number: 125-P ?Place of Service:  SNF (31) ? ? ?CODE STATUS: DNR ? ?No Known Allergies ? ?Chief Complaint  ?Patient presents with  ? Acute Visit  ?  COVID positive   ? ? ?HPI: ? ?She has tested positive for covid. She denies any cough or shortness of breath; no unusual body aches or pains. Denies any fatigue. There are no reports of fevers present.  ? ?Past Medical History:  ?Diagnosis Date  ? Arthritis   ? GERD (gastroesophageal reflux disease)   ? Hypertension   ? ? ?Past Surgical History:  ?Procedure Laterality Date  ? ABDOMINAL HYSTERECTOMY    ? [place]  ? BREAST SURGERY Left   ? benign  ? CATARACT EXTRACTION W/PHACO Right 08/03/2013  ? Procedure: CATARACT EXTRACTION PHACO AND INTRAOCULAR LENS PLACEMENT (IOC);  Surgeon: Tonny Branch, MD;  Location: [facility];  Service: Ophthalmology;  Laterality: Right;  CDE 13.72  ? CHOLECYSTECTOMY    ? ? ?Social History  ? ?Socioeconomic History  ? Marital status: Single  ?  Spouse name: Not on file  ? Number of children: Not on file  ? Years of education: Not on file  ? Highest education level: Not on file  ?Occupational History  ? Not on file  ?Tobacco Use  ? Smoking status: Never  ? Smokeless tobacco: Never  ?Substance and Sexual Activity  ? Alcohol use: No  ? Drug use: No  ? Sexual activity: Yes  ?  Birth control/protection: Surgical  ?Other Topics Concern  ? Not on file  ?Social History Narrative  ? ** Merged History Encounter **  ?    ? ?Social Determinants of Health  ? ?Financial Resource Strain: Not on file  ?Food Insecurity: Not on file  ?Transportation Needs: Not on file  ?Physical Activity: Not on file  ?Stress: Not on file  ?Social Connections: Not on file  ?Intimate Partner Violence: Not on file  ? ?History reviewed. No pertinent family history. ? ? ? ?VITAL SIGNS ?BP (!) 130/50   Pulse 72   Temp 97.6 ?F (36.4 ?C)   Resp 16   Ht 5\' 3"  (1.6 m)   Wt 128 lb 6.4 oz (58.2 kg)   SpO2 95%   BMI 22.75 kg/m?   ? ?Outpatient Encounter Medications as of 07/10/2021  ?Medication Sig  ? acetaminophen (TYLENOL) 650 MG CR tablet Take 650 mg by mouth every 6 (six) hours.  ? amiodarone (PACERONE) 200 MG tablet Take 1 tablet (200 mg total) by mouth daily.  ? amLODipine (NORVASC) 5 MG tablet Take 1 tablet (5 mg total) by mouth daily.  ? Ergocalciferol (VITAMIN D2 PO) Take by mouth. 1,250 mcg (50,000 unit); oral ?Special Instructions: Once A Day Every 7 Daysx 2 doses.  ? Lidocaine 4 % PTCH Apply 2 patches topically. to lower back daily for pain management.  ? losartan (COZAAR) 25 MG tablet Take 1 tablet (25 mg total) by mouth daily.  ? metoprolol succinate (TOPROL-XL) 25 MG 24 hr tablet Take 0.5 tablets (12.5 mg total) by mouth daily.  ? molnupiravir EUA (LAGEVRIO) 200 MG CAPS capsule Take 4 capsules by mouth 2 (two) times daily.  ? NON FORMULARY Diet:Regular  ? omeprazole (PRILOSEC) 40 MG capsule Take 1 capsule (40 mg total) by mouth daily.  ? potassium chloride (MICRO-K) 10 MEQ CR capsule Take 1 capsule (10 mEq total) by mouth daily.  ? Throat Lozenges (ZINC W/A&C) LOZG Special Instructions: 5 Times Per Day x  2 weeks.  ? traMADol (ULTRAM) 50 MG tablet Take 1 tablet (50 mg total) by mouth 2 (two) times daily. AND daily as needed for breakthrough pain  ? vitamin C (ASCORBIC ACID) 500 MG tablet Take 500 mg by mouth 2 (two) times daily.  ? Magnesium Hydroxide (MILK OF MAGNESIA PO) Take by mouth. 30 ml by mouth daily as needed for constipation.  ? ?No facility-administered encounter medications on file as of 07/10/2021.  ? ? ? ?SIGNIFICANT DIAGNOSTIC EXAMS ? ?PREVIOUS  ?  ?06-21-21: right hip x-ray:  ?1. Acute nondisplaced fractures of the right inferior pubic ramus, left superior pubic ramus and left parasymphyseal pubic bone. ?2. Subacute or possibly acute fracture in the right sacrum, likely insufficiency fracture. ?3. Osteopenia and other findings as described. ?  ?06-21-21: ct of head and cervical spine:  ?No acute intracranial  abnormality. Small right-sided scalp hematoma. Chronic small vessel ischemic disease. ?No acute cervical spine fracture. Multilevel degenerative disc disease and facet arthropathy ?  ?NO NEW EXAMS  ?  ?LABS REVIEWED: PREVIOUS  ?  ?06-21-21 wbc 9.1; hgb 11.5; hct 34.6; mcv 100.6 plt 268; glucose 124; bun 38; creat 1.93; k+ 4.1; na++ 139; ca 8.6; GFR 24 ?06-22-21: vitamin B12: 506; folate 24.1 ?06-23-21: wbc 8.8; hgb 10.1; hct 30.1; mcv 101.7 plt 234; glucose 108; bun 37; creat 2.04; k+ 4.7; na++ 138; ca 8.6; GFR 32; mag 2.1  ?  ?NO NEW LABS. ? ?Review of Systems  ?Constitutional:  Negative for malaise/fatigue.  ?Respiratory:  Negative for cough and shortness of breath.   ?Cardiovascular:  Negative for chest pain, palpitations and leg swelling.  ?Gastrointestinal:  Negative for abdominal pain, constipation and heartburn.  ?Musculoskeletal:  Negative for back pain, joint pain and myalgias.  ?Skin: Negative.   ?Neurological:  Negative for dizziness.  ?Psychiatric/Behavioral:  The patient is not nervous/anxious.   ? ?Physical Exam ?Constitutional:   ?   General: She is not in acute distress. ?   Appearance: She is well-developed. She is not diaphoretic.  ?Neck:  ?   Thyroid: No thyromegaly.  ?Cardiovascular:  ?   Rate and Rhythm: Normal rate and regular rhythm.  ?   Pulses: Normal pulses.  ?   Heart sounds: Normal heart sounds.  ?Pulmonary:  ?   Effort: Pulmonary effort is normal. No respiratory distress.  ?   Breath sounds: Normal breath sounds.  ?Abdominal:  ?   General: Bowel sounds are normal. There is no distension.  ?   Palpations: Abdomen is soft.  ?   Tenderness: There is no abdominal tenderness.  ?Musculoskeletal:     ?   General: Normal range of motion.  ?   Cervical back: Neck supple.  ?   Right lower leg: No edema.  ?   Left lower leg: No edema.  ?Lymphadenopathy:  ?   Cervical: No cervical adenopathy.  ?Skin: ?   General: Skin is warm and dry.  ?Neurological:  ?   Mental Status: She is alert and oriented to  person, place, and time.  ?Psychiatric:     ?   Mood and Affect: Mood normal.  ? ? ? ? ?ASSESSMENT/ PLAN: ? ?TODAY ? ?SARS CO v2 positive: will begin vitamin c, d, zinc. Will begin antivirals will monitor her status.  ? ? ? ?Ok Edwards NP ?[facility]  ?call [phone]  ? ?

## 2021-07-11 ENCOUNTER — Non-Acute Institutional Stay (SKILLED_NURSING_FACILITY): Payer: Medicare Other | Admitting: Adult Health

## 2021-07-11 ENCOUNTER — Encounter: Payer: Self-pay | Admitting: Adult Health

## 2021-07-11 DIAGNOSIS — D6869 Other thrombophilia: Secondary | ICD-10-CM

## 2021-07-11 DIAGNOSIS — U071 COVID-19: Secondary | ICD-10-CM

## 2021-07-11 DIAGNOSIS — I482 Chronic atrial fibrillation, unspecified: Secondary | ICD-10-CM | POA: Diagnosis not present

## 2021-07-11 DIAGNOSIS — R7982 Elevated C-reactive protein (CRP): Secondary | ICD-10-CM | POA: Diagnosis not present

## 2021-07-11 DIAGNOSIS — J1282 Pneumonia due to coronavirus disease 2019: Secondary | ICD-10-CM | POA: Diagnosis not present

## 2021-07-11 NOTE — Progress Notes (Signed)
?Location:  [place] ?Nursing Home Room Number: 125-P ?Place of Service:  SNF (31) ? ? ?CODE STATUS: DNR ? ?No Known Allergies ? ?Chief Complaint  ?Patient presents with  ? Acute Visit  ?  Follow-up on covid labs   ? ? ?HPI: ? ?She has tested positive for covid. Her chest x-ray does demonstrate pneumonia left lower lobe. Dr. Linna Darner has consulted with this case. He has personally reviewed her x-ray films. There are no reports of fevers; no coughing; no shortness of breath present. Her d-dimer and crp is elevated.  ? ?Past Medical History:  ?Diagnosis Date  ? Arthritis   ? GERD (gastroesophageal reflux disease)   ? Hypertension   ? ? ?Past Surgical History:  ?Procedure Laterality Date  ? ABDOMINAL HYSTERECTOMY    ? [place]  ? BREAST SURGERY Left   ? benign  ? CATARACT EXTRACTION W/PHACO Right 08/03/2013  ? Procedure: CATARACT EXTRACTION PHACO AND INTRAOCULAR LENS PLACEMENT (IOC);  Surgeon: Tonny Branch, MD;  Location: [facility];  Service: Ophthalmology;  Laterality: Right;  CDE 13.72  ? CHOLECYSTECTOMY    ? ? ?Social History  ? ?Socioeconomic History  ? Marital status: Single  ?  Spouse name: Not on file  ? Number of children: Not on file  ? Years of education: Not on file  ? Highest education level: Not on file  ?Occupational History  ? Not on file  ?Tobacco Use  ? Smoking status: Never  ? Smokeless tobacco: Never  ?Vaping Use  ? Vaping Use: Never used  ?Substance and Sexual Activity  ? Alcohol use: No  ? Drug use: No  ? Sexual activity: Yes  ?  Birth control/protection: Surgical  ?Other Topics Concern  ? Not on file  ?Social History Narrative  ? ** Merged History Encounter **  ?    ? ?Social Determinants of Health  ? ?Financial Resource Strain: Not on file  ?Food Insecurity: Not on file  ?Transportation Needs: Not on file  ?Physical Activity: Not on file  ?Stress: Not on file  ?Social Connections: Not on file  ?Intimate Partner Violence: Not on file  ? ?History reviewed. No pertinent family  history. ? ? ? ?VITAL SIGNS ?BP (!) 139/56   Pulse 83   Temp 98.2 ?F (36.8 ?C)   Resp 20   Ht 5\' 3"  (1.6 m)   Wt 128 lb 6.4 oz (58.2 kg)   SpO2 96%   BMI 22.75 kg/m?  ? ?Outpatient Encounter Medications as of 07/11/2021  ?Medication Sig  ? acetaminophen (TYLENOL) 650 MG CR tablet Take 650 mg by mouth every 6 (six) hours.  ? amiodarone (PACERONE) 200 MG tablet Take 1 tablet (200 mg total) by mouth daily.  ? amLODipine (NORVASC) 5 MG tablet Take 1 tablet (5 mg total) by mouth daily.  ? apixaban (ELIQUIS) 2.5 MG TABS tablet Take 2.5 mg by mouth 2 (two) times daily.  ? cefTRIAXone (ROCEPHIN) 1 g injection Inject 1 g into the muscle daily at 12 noon. 12 pm for left lower lobe pneumonia  ? Ergocalciferol (VITAMIN D2 PO) Take by mouth. 1,250 mcg (50,000 unit); oral ?Special Instructions: Once A Day Every 7 Daysx 2 doses.  ? Lidocaine 4 % PTCH Apply 2 patches topically. to lower back daily for pain management.  ? losartan (COZAAR) 25 MG tablet Take 1 tablet (25 mg total) by mouth daily.  ? Magnesium Hydroxide (MILK OF MAGNESIA PO) Take by mouth. 30 ml by mouth daily as needed for constipation.  ?  metoprolol succinate (TOPROL-XL) 25 MG 24 hr tablet Take 0.5 tablets (12.5 mg total) by mouth daily.  ? molnupiravir EUA (LAGEVRIO) 200 MG CAPS capsule Take 4 capsules by mouth 2 (two) times daily.  ? NON FORMULARY Diet:Regular  ? omeprazole (PRILOSEC) 40 MG capsule Take 1 capsule (40 mg total) by mouth daily.  ? potassium chloride (MICRO-K) 10 MEQ CR capsule Take 1 capsule (10 mEq total) by mouth daily.  ? predniSONE (DELTASONE) 20 MG tablet Take 20 mg by mouth 2 (two) times daily.  ? Throat Lozenges (ZINC W/A&C) LOZG Special Instructions: 5 Times Per Day x 2 weeks.  ? traMADol (ULTRAM) 50 MG tablet Take 50 mg by mouth daily as needed (for break through pain). And twice daily scheduled at 9 am and 9 pm  ? vitamin C (ASCORBIC ACID) 500 MG tablet Take 500 mg by mouth 2 (two) times daily.  ? [DISCONTINUED] traMADol (ULTRAM) 50  MG tablet Take 1 tablet (50 mg total) by mouth 2 (two) times daily. AND daily as needed for breakthrough pain  ? ?No facility-administered encounter medications on file as of 07/11/2021.  ? ? ? ?SIGNIFICANT DIAGNOSTIC EXAMS ? ? ?PREVIOUS  ?  ?06-21-21: right hip x-ray:  ?1. Acute nondisplaced fractures of the right inferior pubic ramus, left superior pubic ramus and left parasymphyseal pubic bone. ?2. Subacute or possibly acute fracture in the right sacrum, likely insufficiency fracture. ?3. Osteopenia and other findings as described. ?  ?06-21-21: ct of head and cervical spine:  ?No acute intracranial abnormality. Small right-sided scalp hematoma. Chronic small vessel ischemic disease. ?No acute cervical spine fracture. Multilevel degenerative disc disease and facet arthropathy ?  ?TODAY ? ?07-10-21: chest x-ray:  ?Opacification left hemidiaphragm with mild patchy density compatible with left basilar pneumonia small left pleural effusion ?Mild cardiomegaly ?Mild degree osteopenia ?Mild osteoarthritis  ?  ?LABS REVIEWED: PREVIOUS  ?  ?06-21-21 wbc 9.1; hgb 11.5; hct 34.6; mcv 100.6 plt 268; glucose 124; bun 38; creat 1.93; k+ 4.1; na++ 139; ca 8.6; GFR 24 ?06-22-21: vitamin B12: 506; folate 24.1 ?06-23-21: wbc 8.8; hgb 10.1; hct 30.1; mcv 101.7 plt 234; glucose 108; bun 37; creat 2.04; k+ 4.7; na++ 138; ca 8.6; GFR 32; mag 2.1  ?  ?TODAY ? ?07-10-21; wbc 9.3; hgb 11.1; hct 32.4; mcv 98.8 plt 415; glucose 109; bun 24; creat 1.58; k+ 4.2; na++ 134; ca 8.6; GFR 31; d-dimer 4.34; CRP 8.1 ? ?Review of Systems  ?Constitutional:  Negative for malaise/fatigue.  ?Respiratory:  Negative for cough and shortness of breath.   ?Cardiovascular:  Negative for chest pain, palpitations and leg swelling.  ?Gastrointestinal:  Negative for abdominal pain, constipation and heartburn.  ?Musculoskeletal:  Negative for back pain, joint pain and myalgias.  ?Skin: Negative.   ?Neurological:  Negative for dizziness.  ?Psychiatric/Behavioral:  The patient  is not nervous/anxious.   ? ? ?Physical Exam ?Constitutional:   ?   General: She is not in acute distress. ?   Appearance: She is well-developed. She is not diaphoretic.  ?Neck:  ?   Thyroid: No thyromegaly.  ?Cardiovascular:  ?   Rate and Rhythm: Normal rate and regular rhythm.  ?   Pulses: Normal pulses.  ?   Heart sounds: Normal heart sounds.  ?Pulmonary:  ?   Effort: Pulmonary effort is normal. No respiratory distress.  ?   Breath sounds: Normal breath sounds.  ?Abdominal:  ?   General: Bowel sounds are normal. There is no distension.  ?   Palpations:  Abdomen is soft.  ?   Tenderness: There is no abdominal tenderness.  ?Musculoskeletal:     ?   General: Normal range of motion.  ?   Cervical back: Neck supple.  ?   Right lower leg: No edema.  ?   Left lower leg: No edema.  ?Lymphadenopathy:  ?   Cervical: No cervical adenopathy.  ?Skin: ?   General: Skin is warm and dry.  ?Neurological:  ?   Mental Status: She is alert and oriented to person, place, and time.  ?Psychiatric:     ?   Mood and Affect: Mood normal.  ? ? ? ? ?ASSESSMENT/ PLAN: ? ?TODAY ? ?SARS Cov positive ?Pneumonia due to covid 19 ?CRP elevated ?Hypercoagulable state associated with covid 19  ? ?Will begin prednisone 20 mg twice daily for 5 days ?Will begin eliquis 2.5 mg twice daily  ?Will begin rocephin 1 gm IM daily for 10 days ?Will continue antiviral medications ?Will repeat crp d-dimer 07-17-21 ? ? ? ? ? ?Ok Edwards NP ?[facility]  ?call [phone]  ? ?

## 2021-07-13 DIAGNOSIS — U071 COVID-19: Secondary | ICD-10-CM | POA: Insufficient documentation

## 2021-07-18 ENCOUNTER — Encounter: Payer: Self-pay | Admitting: Adult Health

## 2021-07-18 ENCOUNTER — Encounter (HOSPITAL_COMMUNITY)
Admission: RE | Admit: 2021-07-18 | Discharge: 2021-07-18 | Disposition: A | Discharge status: Home / Self Care | Payer: Medicare Other | Source: Skilled Nursing Facility | Attending: Adult Health | Admitting: Adult Health

## 2021-07-18 ENCOUNTER — Non-Acute Institutional Stay (SKILLED_NURSING_FACILITY): Payer: Medicare Other | Admitting: Adult Health

## 2021-07-18 DIAGNOSIS — U071 COVID-19: Secondary | ICD-10-CM

## 2021-07-18 DIAGNOSIS — D6869 Other thrombophilia: Secondary | ICD-10-CM | POA: Diagnosis not present

## 2021-07-18 DIAGNOSIS — I1310 Hypertensive heart and chronic kidney disease without heart failure, with stage 1 through stage 4 chronic kidney disease, or unspecified chronic kidney disease: Secondary | ICD-10-CM | POA: Insufficient documentation

## 2021-07-18 DIAGNOSIS — R7982 Elevated C-reactive protein (CRP): Secondary | ICD-10-CM

## 2021-07-18 DIAGNOSIS — N390 Urinary tract infection, site not specified: Secondary | ICD-10-CM | POA: Insufficient documentation

## 2021-07-18 DIAGNOSIS — N184 Chronic kidney disease, stage 4 (severe): Secondary | ICD-10-CM | POA: Insufficient documentation

## 2021-07-18 LAB — C-REACTIVE PROTEIN: CRP: 20.8 mg/dL — ABNORMAL HIGH (ref <1.0)

## 2021-07-18 LAB — D-DIMER, QUANTITATIVE: D-Dimer, Quant: 5.87 ug/mL-FEU — ABNORMAL HIGH (ref 0.00–0.50)

## 2021-07-18 NOTE — Progress Notes (Signed)
? ?Location:  [place] ?Nursing Home Room Number: 546 ?Place of Service:   SNF  ? ? ?CODE STATUS: dnr ? ?No Known Allergies ? ?Chief Complaint  ?Patient presents with  ? Acute Visit  ?  Pain management and follow up labs work   ? ? ?HPI: ? ?She does have pain with movement due to her pelvic fractures. Her CRP is more elevated at 20.8. her chest x-ray demonstrates that her infiltrate is improving. I have spoken with Dr. Linna Darner regarding her lab results. Will need to restart prednisone for 5 more days. Will need to rule out other infection; such as urine.  ? ?Past Medical History:  ?Diagnosis Date  ? Arthritis   ? GERD (gastroesophageal reflux disease)   ? Hypertension   ? ? ?Past Surgical History:  ?Procedure Laterality Date  ? ABDOMINAL HYSTERECTOMY    ? [place]  ? BREAST SURGERY Left   ? benign  ? CATARACT EXTRACTION W/PHACO Right 08/03/2013  ? Procedure: CATARACT EXTRACTION PHACO AND INTRAOCULAR LENS PLACEMENT (IOC);  Surgeon: Tonny Branch, MD;  Location: [facility];  Service: Ophthalmology;  Laterality: Right;  CDE 13.72  ? CHOLECYSTECTOMY    ? ? ?Social History  ? ?Socioeconomic History  ? Marital status: Single  ?  Spouse name: Not on file  ? Number of children: Not on file  ? Years of education: Not on file  ? Highest education level: Not on file  ?Occupational History  ? Not on file  ?Tobacco Use  ? Smoking status: Never  ? Smokeless tobacco: Never  ?Vaping Use  ? Vaping Use: Never used  ?Substance and Sexual Activity  ? Alcohol use: No  ? Drug use: No  ? Sexual activity: Yes  ?  Birth control/protection: Surgical  ?Other Topics Concern  ? Not on file  ?Social History Narrative  ? ** Merged History Encounter **  ?    ? ?Social Determinants of Health  ? ?Financial Resource Strain: Not on file  ?Food Insecurity: Not on file  ?Transportation Needs: Not on file  ?Physical Activity: Not on file  ?Stress: Not on file  ?Social Connections: Not on file  ?Intimate Partner Violence: Not on file  ? ?No family  history on file. ? ? ? ?VITAL SIGNS ?BP (!) 154/62   Pulse 78   Temp 98.6 ?F (37 ?C)   Resp 20   Ht 5\' 3"  (1.6 m)   Wt 128 lb 6.4 oz (58.2 kg)   SpO2 95%   BMI 22.75 kg/m?  ? ?Outpatient Encounter Medications as of 07/18/2021  ?Medication Sig  ? acetaminophen (TYLENOL) 650 MG CR tablet Take 650 mg by mouth every 6 (six) hours.  ? amiodarone (PACERONE) 200 MG tablet Take 1 tablet (200 mg total) by mouth daily.  ? amLODipine (NORVASC) 5 MG tablet Take 1 tablet (5 mg total) by mouth daily.  ? apixaban (ELIQUIS) 2.5 MG TABS tablet Take 2.5 mg by mouth 2 (two) times daily.  ? cefTRIAXone (ROCEPHIN) 1 g injection Inject 1 g into the muscle daily at 12 noon. 12 pm for left lower lobe pneumonia  ? Ergocalciferol (VITAMIN D2 PO) Take by mouth. 1,250 mcg (50,000 unit); oral ?Special Instructions: Once A Day Every 7 Daysx 2 doses.  ? Lidocaine 4 % PTCH Apply 2 patches topically. to lower back daily for pain management.  ? losartan (COZAAR) 25 MG tablet Take 1 tablet (25 mg total) by mouth daily.  ? Magnesium Hydroxide (MILK OF MAGNESIA PO)  Take by mouth. 30 ml by mouth daily as needed for constipation.  ? metoprolol succinate (TOPROL-XL) 25 MG 24 hr tablet Take 0.5 tablets (12.5 mg total) by mouth daily.  ? molnupiravir EUA (LAGEVRIO) 200 MG CAPS capsule Take 4 capsules by mouth 2 (two) times daily.  ? NON FORMULARY Diet:Regular  ? omeprazole (PRILOSEC) 40 MG capsule Take 1 capsule (40 mg total) by mouth daily.  ? potassium chloride (MICRO-K) 10 MEQ CR capsule Take 1 capsule (10 mEq total) by mouth daily.  ? predniSONE (DELTASONE) 20 MG tablet Take 20 mg by mouth 2 (two) times daily.  ? Throat Lozenges (ZINC W/A&C) LOZG Special Instructions: 5 Times Per Day x 2 weeks.  ? traMADol (ULTRAM) 50 MG tablet Take 50 mg by mouth daily as needed (for break through pain). And twice daily scheduled at 9 am and 9 pm  ? vitamin C (ASCORBIC ACID) 500 MG tablet Take 500 mg by mouth 2 (two) times daily.  ? ?No facility-administered  encounter medications on file as of 07/18/2021.  ? ? ? ?SIGNIFICANT DIAGNOSTIC EXAMS ? ?PREVIOUS  ?  ?06-21-21: right hip x-ray:  ?1. Acute nondisplaced fractures of the right inferior pubic ramus, left superior pubic ramus and left parasymphyseal pubic bone. ?2. Subacute or possibly acute fracture in the right sacrum, likely insufficiency fracture. ?3. Osteopenia and other findings as described. ?  ?06-21-21: ct of head and cervical spine:  ?No acute intracranial abnormality. Small right-sided scalp hematoma. Chronic small vessel ischemic disease. ?No acute cervical spine fracture. Multilevel degenerative disc disease and facet arthropathy ?  ?TODAY ? ?07-10-21: chest x-ray:  ?Opacification left hemidiaphragm with mild patchy density compatible with left basilar pneumonia small left pleural effusion ?Mild cardiomegaly ?Mild degree osteopenia ?Mild osteoarthritis  ? ?07-17-21: chest x-ray: improvement in left base pneumonia  ?  ?LABS REVIEWED: PREVIOUS  ?  ?06-21-21 wbc 9.1; hgb 11.5; hct 34.6; mcv 100.6 plt 268; glucose 124; bun 38; creat 1.93; k+ 4.1; na++ 139; ca 8.6; GFR 24 ?06-22-21: vitamin B12: 506; folate 24.1 ?06-23-21: wbc 8.8; hgb 10.1; hct 30.1; mcv 101.7 plt 234; glucose 108; bun 37; creat 2.04; k+ 4.7; na++ 138; ca 8.6; GFR 32; mag 2.1  ?  ?TODAY ? ?07-10-21; wbc 9.3; hgb 11.1; hct 32.4; mcv 98.8 plt 415; glucose 109; bun 24; creat 1.58; k+ 4.2; na++ 134; ca 8.6; GFR 31; d-dimer 4.34; CRP 8.1 ?07-18-21: CRP 20.8; d-dimer 5.87 ? ? ?Review of Systems  ?Constitutional:  Negative for malaise/fatigue.  ?Respiratory:  Negative for cough and shortness of breath.   ?Cardiovascular:  Negative for chest pain, palpitations and leg swelling.  ?Gastrointestinal:  Negative for abdominal pain, constipation and heartburn.  ?Musculoskeletal:  Positive for back pain. Negative for joint pain and myalgias.  ?Skin: Negative.   ?Neurological:  Negative for dizziness.  ?Psychiatric/Behavioral:  The patient is not nervous/anxious.    ? ?Physical Exam ?Constitutional:   ?   General: She is not in acute distress. ?   Appearance: She is well-developed. She is not diaphoretic.  ?Neck:  ?   Thyroid: No thyromegaly.  ?Cardiovascular:  ?   Rate and Rhythm: Normal rate and regular rhythm.  ?   Pulses: Normal pulses.  ?   Heart sounds: Normal heart sounds.  ?Pulmonary:  ?   Effort: Pulmonary effort is normal. No respiratory distress.  ?   Breath sounds: Normal breath sounds.  ?Abdominal:  ?   General: Bowel sounds are normal. There is no distension.  ?  Palpations: Abdomen is soft.  ?   Tenderness: There is no abdominal tenderness.  ?Musculoskeletal:     ?   General: Normal range of motion.  ?   Cervical back: Neck supple.  ?   Right lower leg: No edema.  ?   Left lower leg: No edema.  ?Lymphadenopathy:  ?   Cervical: No cervical adenopathy.  ?Skin: ?   General: Skin is warm and dry.  ?Neurological:  ?   Mental Status: She is alert and oriented to person, place, and time.  ?Psychiatric:     ?   Mood and Affect: Mood normal.  ? ? ? ?ASSESSMENT/ PLAN: ? ?TODAY ? ?SARS CoV 2 positive ?Hypercoagulable state due to covid 19  ?CRP elevated:  ? ?Will get urine culture: and sed rate ?Will repeat prednisone 20 mg twice daily for 5 days ?Will stop routine ultram ?Will repeat CRP 07-24-21.  ? ? ?Ok Edwards NP ?[facility]  ?call [phone]  ? ?

## 2021-07-19 ENCOUNTER — Other Ambulatory Visit: Payer: Self-pay | Admitting: Adult Health

## 2021-07-19 ENCOUNTER — Non-Acute Institutional Stay (SKILLED_NURSING_FACILITY): Payer: Medicare Other | Admitting: Adult Health

## 2021-07-19 ENCOUNTER — Encounter (HOSPITAL_COMMUNITY)
Admission: RE | Admit: 2021-07-19 | Discharge: 2021-07-19 | Disposition: A | Discharge status: Home / Self Care | Payer: Medicare Other | Source: Skilled Nursing Facility | Attending: *Deleted | Admitting: *Deleted

## 2021-07-19 ENCOUNTER — Encounter: Payer: Self-pay | Admitting: Adult Health

## 2021-07-19 DIAGNOSIS — I10 Essential (primary) hypertension: Secondary | ICD-10-CM

## 2021-07-19 DIAGNOSIS — S329XXS Fracture of unspecified parts of lumbosacral spine and pelvis, sequela: Secondary | ICD-10-CM

## 2021-07-19 DIAGNOSIS — U071 COVID-19: Secondary | ICD-10-CM | POA: Insufficient documentation

## 2021-07-19 DIAGNOSIS — R7982 Elevated C-reactive protein (CRP): Secondary | ICD-10-CM | POA: Insufficient documentation

## 2021-07-19 LAB — SEDIMENTATION RATE: Sed Rate: 40 mm/hr — ABNORMAL HIGH (ref 0–22)

## 2021-07-19 MED ORDER — OXYCODONE HCL 5 MG PO TABS: 2.5000 mg | ORAL_TABLET | ORAL | 0 refills | Status: DC | PRN

## 2021-07-19 NOTE — Progress Notes (Signed)
?Location:  [facility] ?[facility] Home Room Number: 125 P ?Place of Service:  SNF (31) ?Provider:  Ok Edwards, NP ? ? ?CODE STATUS: DNR ? ?No Known Allergies ? ?Chief Complaint  ?Patient presents with  ? Acute Visit  ?  Pain management  ? ? ?HPI: ? ?She is not getting adequate pain relief. She is spending nearly all of her time in bed. She is unable to fully participate in therapy. Her po intake is poor. She is positive for covid and has pneumonia. Her renal function is poor; she is unable to take NSAIDs.  ? ?Past Medical History:  ?Diagnosis Date  ? Arthritis   ? GERD (gastroesophageal reflux disease)   ? Hypertension   ? ? ?Past Surgical History:  ?Procedure Laterality Date  ? ABDOMINAL HYSTERECTOMY    ? [place]  ? BREAST SURGERY Left   ? benign  ? CATARACT EXTRACTION W/PHACO Right 08/03/2013  ? Procedure: CATARACT EXTRACTION PHACO AND INTRAOCULAR LENS PLACEMENT (IOC);  Surgeon: Tonny Branch, MD;  Location: [facility];  Service: Ophthalmology;  Laterality: Right;  CDE 13.72  ? CHOLECYSTECTOMY    ? ? ?Social History  ? ?Socioeconomic History  ? Marital status: Single  ?  Spouse name: Not on file  ? Number of children: Not on file  ? Years of education: Not on file  ? Highest education level: Not on file  ?Occupational History  ? Not on file  ?Tobacco Use  ? Smoking status: Never  ? Smokeless tobacco: Never  ?Vaping Use  ? Vaping Use: Never used  ?Substance and Sexual Activity  ? Alcohol use: No  ? Drug use: No  ? Sexual activity: Yes  ?  Birth control/protection: Surgical  ?Other Topics Concern  ? Not on file  ?Social History Narrative  ? ** Merged History Encounter **  ?    ? ?Social Determinants of Health  ? ?Financial Resource Strain: Not on file  ?Food Insecurity: Not on file  ?Transportation Needs: Not on file  ?Physical Activity: Not on file  ?Stress: Not on file  ?Social Connections: Not on file  ?Intimate Partner Violence: Not on file  ? ?No family history on file. ? ? ? ?VITAL SIGNS ?BP 134/72    Pulse 78   Temp 97.6 ?F (36.4 ?C)   Resp (!) 22   Ht 5\' 3"  (1.6 m)   Wt 128 lb 6.4 oz (58.2 kg)   SpO2 98%   BMI 22.75 kg/m?  ? ?Outpatient Encounter Medications as of 07/19/2021  ?Medication Sig  ? acetaminophen (TYLENOL) 650 MG CR tablet Take 650 mg by mouth every 6 (six) hours.  ? amiodarone (PACERONE) 200 MG tablet Take 1 tablet (200 mg total) by mouth daily.  ? amLODipine (NORVASC) 5 MG tablet Take 1 tablet (5 mg total) by mouth daily.  ? apixaban (ELIQUIS) 2.5 MG TABS tablet Take 2.5 mg by mouth 2 (two) times daily.  ? cefTRIAXone (ROCEPHIN) 1 g injection Inject 1 g into the muscle daily at 12 noon. 12 pm for left lower lobe pneumonia  ? Ergocalciferol (VITAMIN D2 PO) Take by mouth. 1,250 mcg (50,000 unit); oral ?Special Instructions: Once A Day Every 7 Daysx 2 doses.  ? Lidocaine 4 % PTCH Apply 2 patches topically. to lower back daily for pain management.  ? Magnesium Hydroxide (MILK OF MAGNESIA PO) Take by mouth. 30 ml by mouth daily as needed for constipation.  ? metoprolol succinate (TOPROL-XL) 25 MG 24 hr tablet Take 0.5 tablets (  12.5 mg total) by mouth daily.  ? NON FORMULARY Diet:Regular  ? omeprazole (PRILOSEC) 40 MG capsule Take 1 capsule (40 mg total) by mouth daily.  ? oxyCODONE (ROXICODONE) 5 MG immediate release tablet Take 0.5 tablets (2.5 mg total) by mouth every 4 (four) hours as needed for up to 5 days for severe pain.  ? potassium chloride (MICRO-K) 10 MEQ CR capsule Take 1 capsule (10 mEq total) by mouth daily.  ? predniSONE (DELTASONE) 20 MG tablet Take 20 mg by mouth 2 (two) times daily.  ? Throat Lozenges (ZINC W/A&C) LOZG Special Instructions: 5 Times Per Day x 2 weeks.  ? vitamin C (ASCORBIC ACID) 500 MG tablet Take 500 mg by mouth 2 (two) times daily.  ? [DISCONTINUED] losartan (COZAAR) 25 MG tablet Take 1 tablet (25 mg total) by mouth daily.  ? [DISCONTINUED] molnupiravir EUA (LAGEVRIO) 200 MG CAPS capsule Take 4 capsules by mouth 2 (two) times daily.  ? ?No facility-administered  encounter medications on file as of 07/19/2021.  ? ? ? ?SIGNIFICANT DIAGNOSTIC EXAMS ? ? ?PREVIOUS  ?  ?06-21-21: right hip x-ray:  ?1. Acute nondisplaced fractures of the right inferior pubic ramus, left superior pubic ramus and left parasymphyseal pubic bone. ?2. Subacute or possibly acute fracture in the right sacrum, likely insufficiency fracture. ?3. Osteopenia and other findings as described. ?  ?06-21-21: ct of head and cervical spine:  ?No acute intracranial abnormality. Small right-sided scalp hematoma. Chronic small vessel ischemic disease. ?No acute cervical spine fracture. Multilevel degenerative disc disease and facet arthropathy ?  ?07-10-21: chest x-ray:  ?Opacification left hemidiaphragm with mild patchy density compatible with left basilar pneumonia small left pleural effusion ?Mild cardiomegaly ?Mild degree osteopenia ?Mild osteoarthritis  ? ?07-17-21: chest x-ray: improvement in left base pneumonia  ? ?NO NEW EXAMS.  ?  ?LABS REVIEWED: PREVIOUS  ?  ?06-21-21 wbc 9.1; hgb 11.5; hct 34.6; mcv 100.6 plt 268; glucose 124; bun 38; creat 1.93; k+ 4.1; na++ 139; ca 8.6; GFR 24 ?06-22-21: vitamin B12: 506; folate 24.1 ?06-23-21: wbc 8.8; hgb 10.1; hct 30.1; mcv 101.7 plt 234; glucose 108; bun 37; creat 2.04; k+ 4.7; na++ 138; ca 8.6; GFR 32; mag 2.1  ?  ?TODAY ? ?07-10-21; wbc 9.3; hgb 11.1; hct 32.4; mcv 98.8 plt 415; glucose 109; bun 24; creat 1.58; k+ 4.2; na++ 134; ca 8.6; GFR 31; d-dimer 4.34; CRP 8.1 ?07-18-21: CRP 20.8; d-dimer 5.87 sed rate: 40. ? ?Review of Systems  ?Constitutional:  Negative for malaise/fatigue.  ?Respiratory:  Negative for cough and shortness of breath.   ?Cardiovascular:  Negative for chest pain, palpitations and leg swelling.  ?Gastrointestinal:  Negative for abdominal pain, constipation and heartburn.  ?Musculoskeletal:  Positive for back pain, joint pain and myalgias.  ?Skin: Negative.   ?Neurological:  Negative for dizziness.  ?Psychiatric/Behavioral:  The patient is not  nervous/anxious.   ? ?Physical Exam ?Constitutional:   ?   General: She is not in acute distress. ?   Appearance: She is well-developed. She is not diaphoretic.  ?Neck:  ?   Thyroid: No thyromegaly.  ?Cardiovascular:  ?   Rate and Rhythm: Normal rate and regular rhythm.  ?   Pulses: Normal pulses.  ?   Heart sounds: Normal heart sounds.  ?Pulmonary:  ?   Effort: Pulmonary effort is normal. No respiratory distress.  ?   Breath sounds: Normal breath sounds.  ?Abdominal:  ?   General: Bowel sounds are normal. There is no distension.  ?  Palpations: Abdomen is soft.  ?   Tenderness: There is no abdominal tenderness.  ?Musculoskeletal:     ?   General: Normal range of motion.  ?   Cervical back: Neck supple.  ?   Right lower leg: No edema.  ?   Left lower leg: No edema.  ?Lymphadenopathy:  ?   Cervical: No cervical adenopathy.  ?Skin: ?   General: Skin is warm and dry.  ?Neurological:  ?   Mental Status: She is alert. Mental status is at baseline.  ?Psychiatric:     ?   Mood and Affect: Mood normal.  ? ? ? ?ASSESSMENT/ PLAN: ? ?TODAY ? ?Essential hypertension ?Closed nondisplaced fracture of pelvis unspecified part of pelvis unspecified ? ?Will stop cozaar due to her renal failure ?Will begin oxycodone 2.5 mg every 6 hours as needed through 07-24-21.  ? ? ?Ok Edwards NP ?[facility]  ? call [phone]  ? ?

## 2021-07-20 ENCOUNTER — Non-Acute Institutional Stay (SKILLED_NURSING_FACILITY): Payer: Medicare Other | Admitting: Adult Health

## 2021-07-20 ENCOUNTER — Encounter: Payer: Self-pay | Admitting: Adult Health

## 2021-07-20 DIAGNOSIS — I482 Chronic atrial fibrillation, unspecified: Secondary | ICD-10-CM | POA: Diagnosis not present

## 2021-07-20 DIAGNOSIS — U071 COVID-19: Secondary | ICD-10-CM

## 2021-07-20 DIAGNOSIS — S329XXS Fracture of unspecified parts of lumbosacral spine and pelvis, sequela: Secondary | ICD-10-CM | POA: Diagnosis not present

## 2021-07-20 DIAGNOSIS — D6869 Other thrombophilia: Secondary | ICD-10-CM

## 2021-07-20 LAB — URINE CULTURE: Culture: NO GROWTH

## 2021-07-20 NOTE — Progress Notes (Signed)
?Location:  [facility] ?Nursing Home Room Number: 125-P ?Place of Service:  SNF (31) ? ? ?CODE STATUS: DNR ? ?No Known Allergies ? ?Chief Complaint  ?Patient presents with  ? Medical Management of Chronic Issues  ?             Hypercoagulable state associated with covid 19: Closed nondisplaced unspecified part of pelvis sequela:   Atrial fibrillation, chronic  ? ? ?HPI: ? ?She is a 88 year old resident of this facility being seen for the management of her chronic illnesses:  Hypercoagulable state associated with covid 19: Closed nondisplaced unspecified part of pelvis sequela:   Atrial fibrillation, chronic. Her pain is currently being managed with oxycodone. She remains on isolation for covid; is on elqius due to elevated d-dimer.  ? ?Past Medical History:  ?Diagnosis Date  ? Arthritis   ? GERD (gastroesophageal reflux disease)   ? Hypertension   ? ? ?Past Surgical History:  ?Procedure Laterality Date  ? ABDOMINAL HYSTERECTOMY    ? [place]  ? BREAST SURGERY Left   ? benign  ? CATARACT EXTRACTION W/PHACO Right 08/03/2013  ? Procedure: CATARACT EXTRACTION PHACO AND INTRAOCULAR LENS PLACEMENT (IOC);  Surgeon: Tonny Branch, MD;  Location: [facility];  Service: Ophthalmology;  Laterality: Right;  CDE 13.72  ? CHOLECYSTECTOMY    ? ? ?Social History  ? ?Socioeconomic History  ? Marital status: Single  ?  Spouse name: Not on file  ? Number of children: Not on file  ? Years of education: Not on file  ? Highest education level: Not on file  ?Occupational History  ? Not on file  ?Tobacco Use  ? Smoking status: Never  ? Smokeless tobacco: Never  ?Vaping Use  ? Vaping Use: Never used  ?Substance and Sexual Activity  ? Alcohol use: No  ? Drug use: No  ? Sexual activity: Yes  ?  Birth control/protection: Surgical  ?Other Topics Concern  ? Not on file  ?Social History Narrative  ? ** Merged History Encounter **  ?    ? ?Social Determinants of Health  ? ?Financial Resource Strain: Not on file  ?Food Insecurity: Not on file   ?Transportation Needs: Not on file  ?Physical Activity: Not on file  ?Stress: Not on file  ?Social Connections: Not on file  ?Intimate Partner Violence: Not on file  ? ?History reviewed. No pertinent family history. ? ? ? ?VITAL SIGNS ?BP (!) 151/63   Pulse 84   Temp (!) 97.3 ?F (36.3 ?C) (Temporal)   Resp 18   Ht 5\' 3"  (1.6 m)   Wt 128 lb 6.4 oz (58.2 kg)   SpO2 96%   BMI 22.75 kg/m?  ? ?Outpatient Encounter Medications as of 07/20/2021  ?Medication Sig  ? acetaminophen (TYLENOL) 650 MG CR tablet Take 650 mg by mouth every 6 (six) hours.  ? amiodarone (PACERONE) 200 MG tablet Take 1 tablet (200 mg total) by mouth daily.  ? amLODipine (NORVASC) 5 MG tablet Take 1 tablet (5 mg total) by mouth daily.  ? apixaban (ELIQUIS) 2.5 MG TABS tablet Take 2.5 mg by mouth 2 (two) times daily.  ? Ergocalciferol (VITAMIN D2 PO) Take by mouth. 1,250 mcg (50,000 unit); oral ?Special Instructions: Once A Day Every 7 Daysx 2 doses.  ? Lidocaine 4 % PTCH Apply 2 patches topically. to lower back daily for pain management.  ? Magnesium Hydroxide (MILK OF MAGNESIA PO) Take by mouth. 30 ml by mouth daily as needed for constipation.  ?  metoprolol succinate (TOPROL-XL) 25 MG 24 hr tablet Take 0.5 tablets (12.5 mg total) by mouth daily.  ? NON FORMULARY Diet:Regular  ? omeprazole (PRILOSEC) 40 MG capsule Take 1 capsule (40 mg total) by mouth daily.  ? oxyCODONE (OXY IR/ROXICODONE) 5 MG immediate release tablet Take 5 mg by mouth every 6 (six) hours as needed for severe pain.  ? potassium chloride (MICRO-K) 10 MEQ CR capsule Take 1 capsule (10 mEq total) by mouth daily.  ? predniSONE (DELTASONE) 20 MG tablet Take 20 mg by mouth 2 (two) times daily.  ? Throat Lozenges (ZINC W/A&C) LOZG Special Instructions: 5 Times Per Day x 2 weeks.  ? vitamin C (ASCORBIC ACID) 500 MG tablet Take 500 mg by mouth 2 (two) times daily.  ? [DISCONTINUED] cefTRIAXone (ROCEPHIN) 1 g injection Inject 1 g into the muscle daily at 12 noon. 12 pm for left lower  lobe pneumonia  ? [DISCONTINUED] oxyCODONE (ROXICODONE) 5 MG immediate release tablet Take 0.5 tablets (2.5 mg total) by mouth every 4 (four) hours as needed for up to 5 days for severe pain.  ? ?No facility-administered encounter medications on file as of 07/20/2021.  ? ? ? ?SIGNIFICANT DIAGNOSTIC EXAMS ? ? ?PREVIOUS  ?  ?06-21-21: right hip x-ray:  ?1. Acute nondisplaced fractures of the right inferior pubic ramus, left superior pubic ramus and left parasymphyseal pubic bone. ?2. Subacute or possibly acute fracture in the right sacrum, likely insufficiency fracture. ?3. Osteopenia and other findings as described. ?  ?06-21-21: ct of head and cervical spine:  ?No acute intracranial abnormality. Small right-sided scalp hematoma. Chronic small vessel ischemic disease. ?No acute cervical spine fracture. Multilevel degenerative disc disease and facet arthropathy ?  ?07-10-21: chest x-ray:  ?Opacification left hemidiaphragm with mild patchy density compatible with left basilar pneumonia small left pleural effusion ?Mild cardiomegaly ?Mild degree osteopenia ?Mild osteoarthritis  ? ?07-17-21: chest x-ray: improvement in left base pneumonia  ? ?NO NEW EXAMS.  ?  ?LABS REVIEWED: PREVIOUS  ?  ?06-21-21 wbc 9.1; hgb 11.5; hct 34.6; mcv 100.6 plt 268; glucose 124; bun 38; creat 1.93; k+ 4.1; na++ 139; ca 8.6; GFR 24 ?06-22-21: vitamin B12: 506; folate 24.1 ?06-23-21: wbc 8.8; hgb 10.1; hct 30.1; mcv 101.7 plt 234; glucose 108; bun 37; creat 2.04; k+ 4.7; na++ 138; ca 8.6; GFR 32; mag 2.1  ?  ?TODAY ? ?07-10-21; wbc 9.3; hgb 11.1; hct 32.4; mcv 98.8 plt 415; glucose 109; bun 24; creat 1.58; k+ 4.2; na++ 134; ca 8.6; GFR 31; d-dimer 4.34; CRP 8.1 ?07-18-21: CRP 20.8; d-dimer 5.87 sed rate: 40. ? ?Review of Systems  ?Constitutional:  Negative for malaise/fatigue.  ?Respiratory:  Negative for cough and shortness of breath.   ?Cardiovascular:  Negative for chest pain, palpitations and leg swelling.  ?Gastrointestinal:  Negative for abdominal  pain, constipation and heartburn.  ?Musculoskeletal:  Negative for back pain, joint pain and myalgias.  ?Skin: Negative.   ?Neurological:  Negative for dizziness.  ?Psychiatric/Behavioral:  The patient is not nervous/anxious.   ? ? ?Physical Exam ?Constitutional:   ?   General: She is not in acute distress. ?   Appearance: She is well-developed. She is not diaphoretic.  ?Neck:  ?   Thyroid: No thyromegaly.  ?Cardiovascular:  ?   Rate and Rhythm: Normal rate and regular rhythm.  ?   Pulses: Normal pulses.  ?   Heart sounds: Normal heart sounds.  ?Pulmonary:  ?   Effort: Pulmonary effort is normal. No respiratory distress.  ?  Breath sounds: Normal breath sounds.  ?Abdominal:  ?   General: Bowel sounds are normal. There is no distension.  ?   Palpations: Abdomen is soft.  ?   Tenderness: There is no abdominal tenderness.  ?Musculoskeletal:     ?   General: Normal range of motion.  ?   Cervical back: Neck supple.  ?   Right lower leg: No edema.  ?   Left lower leg: No edema.  ?Lymphadenopathy:  ?   Cervical: No cervical adenopathy.  ?Skin: ?   General: Skin is warm and dry.  ?Neurological:  ?   Mental Status: She is alert. Mental status is at baseline.  ?Psychiatric:     ?   Mood and Affect: Mood normal.  ? ?   ?ASSESSMENT/ PLAN: ? ? ?TODAY ? ?Hypercoagulable state associated with covid 19: her d-dimer is 5.87; will continue low dose eliquis 2.5 mg twice daily will monitor her status. Is due for d-dimer  ? ?2. Closed nondisplaced unspecified part of pelvis sequela: will continue therapy as able. Will follow up with orthopedics. Will continue oxycodone 2.5 mg every 6 hours as needed is off ultram and mobic due to poor renal function.  ? ?3.  Atrial fibrillation, chronic: will continue amiodarone 200 mg daily and toprol xl 12.5 mg daily for rate control.  ? ?PREVIOUS  ? ?4.  Essential hypertension: b/p 152/59: will continue cozaar 25 mg daily and norvasc 5 mg daily; toprol xl 12.5 mg daily  ? ?5. Gastroesophageal reflux  disease without esophagitis: is stable will continue prilosec 40 mg daily  ? ?6. Stage 4 chronic kidney disease: bun 37; creat 2.04 gfr 23.  ? ? ?Ok Edwards NP ?[facility]  ? call [phone]  ?

## 2021-07-21 ENCOUNTER — Non-Acute Institutional Stay (SKILLED_NURSING_FACILITY): Payer: Medicare Other | Admitting: Adult Health

## 2021-07-21 ENCOUNTER — Encounter: Payer: Self-pay | Admitting: Adult Health

## 2021-07-21 DIAGNOSIS — S329XXS Fracture of unspecified parts of lumbosacral spine and pelvis, sequela: Secondary | ICD-10-CM | POA: Diagnosis not present

## 2021-07-21 DIAGNOSIS — D6869 Other thrombophilia: Secondary | ICD-10-CM | POA: Diagnosis not present

## 2021-07-21 DIAGNOSIS — U071 COVID-19: Secondary | ICD-10-CM | POA: Diagnosis not present

## 2021-07-21 DIAGNOSIS — I482 Chronic atrial fibrillation, unspecified: Secondary | ICD-10-CM

## 2021-07-21 NOTE — Progress Notes (Signed)
?Location:  [facility] ?Nursing Home Room Number: 125-P ?Place of Service:  SNF (31) ? ? ?CODE STATUS: DNR ? ?No Known Allergies ? ?Chief Complaint  ?Patient presents with  ? Acute Visit  ?  Care plan meeting  ? ? ?HPI: ? ?We have come together for her care plan meeting. Family present. She requires limited assist with adls. Is occasionally incontinent of bladder frequently incontinent of bowel. She is presently on precautions for covid through 07-24-21. She is presently on prednisone for her elevated crp. She has chronic renal disease. She is having increased pelvic and right hip pain. We are getting an x-ray to see if there is any further issues. We have discussed her pain management. She apparently has been taking ultram twice daily for the past 6 months due to back pain. She has had an ablation done 2 months ago for pain. She remains anxious with agitation as she is currently on prednisone due to elevated crp. She is currently on oxycodone 2.5 mg every 6 hours as needed. Will give her a dose prior to therapy.  ? ?Past Medical History:  ?Diagnosis Date  ? Arthritis   ? GERD (gastroesophageal reflux disease)   ? Hypertension   ? ? ?Past Surgical History:  ?Procedure Laterality Date  ? ABDOMINAL HYSTERECTOMY    ? [place]  ? BREAST SURGERY Left   ? benign  ? CATARACT EXTRACTION W/PHACO Right 08/03/2013  ? Procedure: CATARACT EXTRACTION PHACO AND INTRAOCULAR LENS PLACEMENT (IOC);  Surgeon: Tonny Branch, MD;  Location: [facility];  Service: Ophthalmology;  Laterality: Right;  CDE 13.72  ? CHOLECYSTECTOMY    ? ? ?Social History  ? ?Socioeconomic History  ? Marital status: Single  ?  Spouse name: Not on file  ? Number of children: Not on file  ? Years of education: Not on file  ? Highest education level: Not on file  ?Occupational History  ? Not on file  ?Tobacco Use  ? Smoking status: Never  ? Smokeless tobacco: Never  ?Vaping Use  ? Vaping Use: Never used  ?Substance and Sexual Activity  ? Alcohol use: No  ? Drug  use: No  ? Sexual activity: Yes  ?  Birth control/protection: Surgical  ?Other Topics Concern  ? Not on file  ?Social History Narrative  ? ** Merged History Encounter **  ?    ? ?Social Determinants of Health  ? ?Financial Resource Strain: Not on file  ?Food Insecurity: Not on file  ?Transportation Needs: Not on file  ?Physical Activity: Not on file  ?Stress: Not on file  ?Social Connections: Not on file  ?Intimate Partner Violence: Not on file  ? ?History reviewed. No pertinent family history. ? ? ? ?VITAL SIGNS ?BP (!) 148/68   Pulse 75   Temp 98 ?F (36.7 ?C)   Resp 18   Ht 5\' 3"  (1.6 m)   Wt 128 lb 6.4 oz (58.2 kg)   SpO2 95%   BMI 22.75 kg/m?  ? ?Outpatient Encounter Medications as of 07/21/2021  ?Medication Sig  ? acetaminophen (TYLENOL) 650 MG CR tablet Take 650 mg by mouth every 6 (six) hours.  ? amiodarone (PACERONE) 200 MG tablet Take 1 tablet (200 mg total) by mouth daily.  ? amLODipine (NORVASC) 5 MG tablet Take 1 tablet (5 mg total) by mouth daily.  ? apixaban (ELIQUIS) 2.5 MG TABS tablet Take 2.5 mg by mouth 2 (two) times daily.  ? Ergocalciferol (VITAMIN D2 PO) Take by mouth. 1,250 mcg (  50,000 unit); oral ?Special Instructions: Once A Day Every 7 Daysx 2 doses.  ? Lidocaine 4 % PTCH Apply 2 patches topically. to lower back daily for pain management.  ? Magnesium Hydroxide (MILK OF MAGNESIA PO) Take by mouth. 30 ml by mouth daily as needed for constipation.  ? metoprolol succinate (TOPROL-XL) 25 MG 24 hr tablet Take 0.5 tablets (12.5 mg total) by mouth daily.  ? NON FORMULARY Diet:Regular  ? omeprazole (PRILOSEC) 40 MG capsule Take 1 capsule (40 mg total) by mouth daily.  ? oxyCODONE (OXY IR/ROXICODONE) 5 MG immediate release tablet Take 5 mg by mouth every 6 (six) hours as needed for severe pain.  ? potassium chloride (MICRO-K) 10 MEQ CR capsule Take 1 capsule (10 mEq total) by mouth daily.  ? predniSONE (DELTASONE) 20 MG tablet Take 20 mg by mouth 2 (two) times daily.  ? Throat Lozenges (ZINC  W/A&C) LOZG Special Instructions: 5 Times Per Day x 2 weeks.  ? vitamin C (ASCORBIC ACID) 500 MG tablet Take 500 mg by mouth 2 (two) times daily.  ? ?No facility-administered encounter medications on file as of 07/21/2021.  ? ? ? ?SIGNIFICANT DIAGNOSTIC EXAMS ? ? ?PREVIOUS  ?  ?06-21-21: right hip x-ray:  ?1. Acute nondisplaced fractures of the right inferior pubic ramus, left superior pubic ramus and left parasymphyseal pubic bone. ?2. Subacute or possibly acute fracture in the right sacrum, likely insufficiency fracture. ?3. Osteopenia and other findings as described. ?  ?06-21-21: ct of head and cervical spine:  ?No acute intracranial abnormality. Small right-sided scalp hematoma. Chronic small vessel ischemic disease. ?No acute cervical spine fracture. Multilevel degenerative disc disease and facet arthropathy ?  ?07-10-21: chest x-ray:  ?Opacification left hemidiaphragm with mild patchy density compatible with left basilar pneumonia small left pleural effusion ?Mild cardiomegaly ?Mild degree osteopenia ?Mild osteoarthritis  ? ?07-17-21: chest x-ray: improvement in left base pneumonia  ? ?NO NEW EXAMS.  ?  ?LABS REVIEWED: PREVIOUS  ?  ?06-21-21 wbc 9.1; hgb 11.5; hct 34.6; mcv 100.6 plt 268; glucose 124; bun 38; creat 1.93; k+ 4.1; na++ 139; ca 8.6; GFR 24 ?06-22-21: vitamin B12: 506; folate 24.1 ?06-23-21: wbc 8.8; hgb 10.1; hct 30.1; mcv 101.7 plt 234; glucose 108; bun 37; creat 2.04; k+ 4.7; na++ 138; ca 8.6; GFR 32; mag 2.1  ?  ?TODAY ? ?07-10-21; wbc 9.3; hgb 11.1; hct 32.4; mcv 98.8 plt 415; glucose 109; bun 24; creat 1.58; k+ 4.2; na++ 134; ca 8.6; GFR 31; d-dimer 4.34; CRP 8.1 ?07-18-21: CRP 20.8; d-dimer 5.87 sed rate: 40. ? ?Review of Systems  ?Constitutional:  Negative for malaise/fatigue.  ?Respiratory:  Negative for cough and shortness of breath.   ?Cardiovascular:  Negative for chest pain, palpitations and leg swelling.  ?Gastrointestinal:  Negative for abdominal pain, constipation and heartburn.   ?Musculoskeletal:  Positive for back pain, joint pain and myalgias.  ?Skin: Negative.   ?Neurological:  Negative for dizziness.  ?Psychiatric/Behavioral:  The patient is not nervous/anxious.   ? ?Physical Exam ?Constitutional:   ?   General: She is not in acute distress. ?   Appearance: She is well-developed. She is not diaphoretic.  ?Neck:  ?   Thyroid: No thyromegaly.  ?Cardiovascular:  ?   Rate and Rhythm: Normal rate and regular rhythm.  ?   Pulses: Normal pulses.  ?   Heart sounds: Normal heart sounds.  ?Pulmonary:  ?   Effort: Pulmonary effort is normal. No respiratory distress.  ?   Breath sounds: Normal  breath sounds.  ?Abdominal:  ?   General: Bowel sounds are normal. There is no distension.  ?   Palpations: Abdomen is soft.  ?   Tenderness: There is no abdominal tenderness.  ?Musculoskeletal:     ?   General: Normal range of motion.  ?   Cervical back: Neck supple.  ?   Right lower leg: No edema.  ?   Left lower leg: No edema.  ?Lymphadenopathy:  ?   Cervical: No cervical adenopathy.  ?Skin: ?   General: Skin is warm and dry.  ?Neurological:  ?   Mental Status: She is alert and oriented to person, place, and time.  ?Psychiatric:     ?   Mood and Affect: Mood normal.  ? ? ? ?ASSESSMENT/ PLAN: ? ?TODAY ? ?Closed nondisplaced fracture of pelvis unspecified part of pelvis sequela ?Atrial fibrillation chronic ?Hypercoagulable state associated with covid 19.  ? ?Will continue therapy as directed ?Will continue current medications ?Will monitor her status.  ? ? ?Time spent with patient: 40 minutes: therapy; goals of care; medications.  ? ? ?Ok Edwards NP ?[facility]  ?call [phone]  ? ?

## 2021-07-24 ENCOUNTER — Non-Acute Institutional Stay (SKILLED_NURSING_FACILITY): Payer: Medicare Other | Admitting: Adult Health

## 2021-07-24 ENCOUNTER — Other Ambulatory Visit (HOSPITAL_COMMUNITY)
Admission: RE | Admit: 2021-07-24 | Discharge: 2021-07-24 | Disposition: A | Discharge status: Home / Self Care | Payer: Medicare Other | Source: Skilled Nursing Facility | Attending: Adult Health | Admitting: Adult Health

## 2021-07-24 ENCOUNTER — Encounter: Payer: Self-pay | Admitting: Adult Health

## 2021-07-24 DIAGNOSIS — U071 COVID-19: Secondary | ICD-10-CM | POA: Insufficient documentation

## 2021-07-24 DIAGNOSIS — S3282XS Multiple fractures of pelvis without disruption of pelvic ring, sequela: Secondary | ICD-10-CM | POA: Diagnosis not present

## 2021-07-24 LAB — C-REACTIVE PROTEIN: CRP: 2.3 mg/dL — ABNORMAL HIGH (ref <1.0)

## 2021-07-24 NOTE — Progress Notes (Signed)
?Location:  [place] ?Nursing Home Room Number: 125-P ?Place of Service:  SNF (31) ? ? ?CODE STATUS: DNR ? ?No Known Allergies ? ?Chief Complaint  ?Patient presents with  ? Acute Visit  ?  Follow-up for X-ray  ? ? ?HPI: ? ?She has had a x-ray performed which demonstrates possibly new pelvic fractures. She will need a ct scan for better imaging. She continues to have pain present.  ? ?Past Medical History:  ?Diagnosis Date  ? Arthritis   ? GERD (gastroesophageal reflux disease)   ? Hypertension   ? ? ?Past Surgical History:  ?Procedure Laterality Date  ? ABDOMINAL HYSTERECTOMY    ? [place]  ? BREAST SURGERY Left   ? benign  ? CATARACT EXTRACTION W/PHACO Right 08/03/2013  ? Procedure: CATARACT EXTRACTION PHACO AND INTRAOCULAR LENS PLACEMENT (IOC);  Surgeon: Tonny Branch, MD;  Location: [facility];  Service: Ophthalmology;  Laterality: Right;  CDE 13.72  ? CHOLECYSTECTOMY    ? ? ?Social History  ? ?Socioeconomic History  ? Marital status: Single  ?  Spouse name: Not on file  ? Number of children: Not on file  ? Years of education: Not on file  ? Highest education level: Not on file  ?Occupational History  ? Not on file  ?Tobacco Use  ? Smoking status: Never  ? Smokeless tobacco: Never  ?Vaping Use  ? Vaping Use: Never used  ?Substance and Sexual Activity  ? Alcohol use: No  ? Drug use: No  ? Sexual activity: Yes  ?  Birth control/protection: Surgical  ?Other Topics Concern  ? Not on file  ?Social History Narrative  ? ** Merged History Encounter **  ?    ? ?Social Determinants of Health  ? ?Financial Resource Strain: Not on file  ?Food Insecurity: Not on file  ?Transportation Needs: Not on file  ?Physical Activity: Not on file  ?Stress: Not on file  ?Social Connections: Not on file  ?Intimate Partner Violence: Not on file  ? ?History reviewed. No pertinent family history. ? ? ? ?VITAL SIGNS ?BP (!) 152/67   Pulse 84   Temp 98.2 ?F (36.8 ?C)   Resp 18   Ht 5\' 3"  (1.6 m)   Wt 128 lb 6.4 oz (58.2 kg)   SpO2 94%    BMI 22.75 kg/m?  ? ?Outpatient Encounter Medications as of 07/24/2021  ?Medication Sig  ? acetaminophen (TYLENOL) 650 MG CR tablet Take 650 mg by mouth every 6 (six) hours.  ? amiodarone (PACERONE) 200 MG tablet Take 1 tablet (200 mg total) by mouth daily.  ? amLODipine (NORVASC) 5 MG tablet Take 1 tablet (5 mg total) by mouth daily.  ? apixaban (ELIQUIS) 2.5 MG TABS tablet Take 2.5 mg by mouth 2 (two) times daily.  ? Lidocaine 4 % PTCH Apply 2 patches topically. to lower back daily for pain management.  ? Magnesium Hydroxide (MILK OF MAGNESIA PO) Take by mouth. 30 ml by mouth daily as needed for constipation.  ? metoprolol succinate (TOPROL-XL) 25 MG 24 hr tablet Take 0.5 tablets (12.5 mg total) by mouth daily.  ? NON FORMULARY Diet:Regular  ? omeprazole (PRILOSEC) 40 MG capsule Take 1 capsule (40 mg total) by mouth daily.  ? potassium chloride (MICRO-K) 10 MEQ CR capsule Take 1 capsule (10 mEq total) by mouth daily.  ? Ergocalciferol (VITAMIN D2 PO) Take by mouth. 1,250 mcg (50,000 unit); oral ?Special Instructions: Once A Day Every 7 Daysx 2 doses.  ? Throat Lozenges (ZINC  W/A&C) LOZG Special Instructions: 5 Times Per Day x 2 weeks.  ? vitamin C (ASCORBIC ACID) 500 MG tablet Take 500 mg by mouth 2 (two) times daily.  ? [DISCONTINUED] oxyCODONE (OXY IR/ROXICODONE) 5 MG immediate release tablet Take 5 mg by mouth every 6 (six) hours as needed for severe pain.  ? [DISCONTINUED] predniSONE (DELTASONE) 20 MG tablet Take 20 mg by mouth 2 (two) times daily.  ? ?No facility-administered encounter medications on file as of 07/24/2021.  ? ? ? ?SIGNIFICANT DIAGNOSTIC EXAMS ? ? ?PREVIOUS  ?  ?06-21-21: right hip x-ray:  ?1. Acute nondisplaced fractures of the right inferior pubic ramus, left superior pubic ramus and left parasymphyseal pubic bone. ?2. Subacute or possibly acute fracture in the right sacrum, likely insufficiency fracture. ?3. Osteopenia and other findings as described. ?  ?06-21-21: ct of head and cervical  spine:  ?No acute intracranial abnormality. Small right-sided scalp hematoma. Chronic small vessel ischemic disease. ?No acute cervical spine fracture. Multilevel degenerative disc disease and facet arthropathy ?  ?07-10-21: chest x-ray:  ?Opacification left hemidiaphragm with mild patchy density compatible with left basilar pneumonia small left pleural effusion ?Mild cardiomegaly ?Mild degree osteopenia ?Mild osteoarthritis  ? ?07-17-21: chest x-ray: improvement in left base pneumonia  ? ?TODAY ? ?07-21-21: pelvic x-ray:  ?Degenerative changes of lumbar spine ?Bilateral fracture of superior/inferior pubic rami appear acute ?Hip unremarkable.  ?  ?LABS REVIEWED: PREVIOUS  ?  ?06-21-21 wbc 9.1; hgb 11.5; hct 34.6; mcv 100.6 plt 268; glucose 124; bun 38; creat 1.93; k+ 4.1; na++ 139; ca 8.6; GFR 24 ?06-22-21: vitamin B12: 506; folate 24.1 ?06-23-21: wbc 8.8; hgb 10.1; hct 30.1; mcv 101.7 plt 234; glucose 108; bun 37; creat 2.04; k+ 4.7; na++ 138; ca 8.6; GFR 32; mag 2.1  ?  ?TODAY ? ?07-10-21; wbc 9.3; hgb 11.1; hct 32.4; mcv 98.8 plt 415; glucose 109; bun 24; creat 1.58; k+ 4.2; na++ 134; ca 8.6; GFR 31; d-dimer 4.34; CRP 8.1 ?07-18-21: CRP 20.8; d-dimer 5.87 sed rate: 40. ? ?Review of Systems  ?Constitutional:  Negative for malaise/fatigue.  ?Respiratory:  Negative for cough and shortness of breath.   ?Cardiovascular:  Negative for chest pain, palpitations and leg swelling.  ?Gastrointestinal:  Negative for abdominal pain, constipation and heartburn.  ?Musculoskeletal:  Positive for joint pain and myalgias. Negative for back pain.  ?Skin: Negative.   ?Neurological:  Negative for dizziness.  ?Psychiatric/Behavioral:  The patient is not nervous/anxious.   ? ?Physical Exam ?Constitutional:   ?   General: She is not in acute distress. ?   Appearance: She is well-developed. She is not diaphoretic.  ?Neck:  ?   Thyroid: No thyromegaly.  ?Cardiovascular:  ?   Rate and Rhythm: Normal rate and regular rhythm.  ?   Pulses: Normal  pulses.  ?   Heart sounds: Normal heart sounds.  ?Pulmonary:  ?   Effort: Pulmonary effort is normal. No respiratory distress.  ?   Breath sounds: Normal breath sounds.  ?Abdominal:  ?   General: Bowel sounds are normal. There is no distension.  ?   Palpations: Abdomen is soft.  ?   Tenderness: There is no abdominal tenderness.  ?Musculoskeletal:     ?   General: Normal range of motion.  ?   Cervical back: Neck supple.  ?   Right lower leg: No edema.  ?   Left lower leg: No edema.  ?Lymphadenopathy:  ?   Cervical: No cervical adenopathy.  ?Skin: ?   General:  Skin is warm and dry.  ?Neurological:  ?   Mental Status: She is alert and oriented to person, place, and time.  ?Psychiatric:     ?   Mood and Affect: Mood normal.  ? ? ? ?ASSESSMENT/ PLAN: ? ?TODAY ? ?Multiple closed fractures of pelvis without disruption of pelvic ring sequela  ? ?Will get ct without contrast of hips and pelvis will monitor her status.  ? ? ?Ok Edwards NP ?[facility]  ? call [phone]  ? ?

## 2021-07-25 ENCOUNTER — Non-Acute Institutional Stay (SKILLED_NURSING_FACILITY): Payer: Medicare Other | Admitting: Adult Health

## 2021-07-25 ENCOUNTER — Encounter: Payer: Self-pay | Admitting: Adult Health

## 2021-07-25 ENCOUNTER — Other Ambulatory Visit: Payer: Self-pay | Admitting: Adult Health

## 2021-07-25 ENCOUNTER — Ambulatory Visit (HOSPITAL_COMMUNITY)
Admission: RE | Admit: 2021-07-25 | Discharge: 2021-07-25 | Disposition: A | Discharge status: Home / Self Care | Payer: Medicare Other | Source: Ambulatory Visit | Attending: Adult Health | Admitting: Adult Health

## 2021-07-25 ENCOUNTER — Other Ambulatory Visit (HOSPITAL_COMMUNITY): Payer: Self-pay | Admitting: Adult Health

## 2021-07-25 DIAGNOSIS — S3219XA Other fracture of sacrum, initial encounter for closed fracture: Secondary | ICD-10-CM | POA: Diagnosis not present

## 2021-07-25 DIAGNOSIS — S3210XS Unspecified fracture of sacrum, sequela: Secondary | ICD-10-CM

## 2021-07-25 DIAGNOSIS — S32491A Other specified fracture of right acetabulum, initial encounter for closed fracture: Secondary | ICD-10-CM | POA: Diagnosis not present

## 2021-07-25 DIAGNOSIS — S3289XA Fracture of other parts of pelvis, initial encounter for closed fracture: Secondary | ICD-10-CM

## 2021-07-25 DIAGNOSIS — R52 Pain, unspecified: Secondary | ICD-10-CM

## 2021-07-25 DIAGNOSIS — S32511A Fracture of superior rim of right pubis, initial encounter for closed fracture: Secondary | ICD-10-CM | POA: Diagnosis not present

## 2021-07-25 DIAGNOSIS — S3282XS Multiple fractures of pelvis without disruption of pelvic ring, sequela: Secondary | ICD-10-CM | POA: Diagnosis not present

## 2021-07-25 DIAGNOSIS — S32512A Fracture of superior rim of left pubis, initial encounter for closed fracture: Secondary | ICD-10-CM | POA: Diagnosis not present

## 2021-07-25 NOTE — Progress Notes (Signed)
?Location:  [place] ?Nursing Home Room Number: NO/125/P ?Place of Service:  SNF (31) ? ? ?CODE STATUS: DNR ? ?No Known Allergies ? ?Chief Complaint  ?Patient presents with  ? Acute Visit  ?  Patient is here for a F/U for recent CT scan results  ? ? ?HPI: ? ?Her ct scan does demonstrate multiple pelvic fractures with sacral fractures with recommended MRI of hips. I have discussed the results with her and her nephew. She could benefit from forteo for osteoporosis.  ? ?Past Medical History:  ?Diagnosis Date  ? Arthritis   ? GERD (gastroesophageal reflux disease)   ? Hypertension   ? ? ?Past Surgical History:  ?Procedure Laterality Date  ? ABDOMINAL HYSTERECTOMY    ? [place]  ? BREAST SURGERY Left   ? benign  ? CATARACT EXTRACTION W/PHACO Right 08/03/2013  ? Procedure: CATARACT EXTRACTION PHACO AND INTRAOCULAR LENS PLACEMENT (IOC);  Surgeon: Tonny Branch, MD;  Location: [facility];  Service: Ophthalmology;  Laterality: Right;  CDE 13.72  ? CHOLECYSTECTOMY    ? ? ?Social History  ? ?Socioeconomic History  ? Marital status: Single  ?  Spouse name: Not on file  ? Number of children: Not on file  ? Years of education: Not on file  ? Highest education level: Not on file  ?Occupational History  ? Not on file  ?Tobacco Use  ? Smoking status: Never  ? Smokeless tobacco: Never  ?Vaping Use  ? Vaping Use: Never used  ?Substance and Sexual Activity  ? Alcohol use: No  ? Drug use: No  ? Sexual activity: Yes  ?  Birth control/protection: Surgical  ?Other Topics Concern  ? Not on file  ?Social History Narrative  ? ** Merged History Encounter **  ?    ? ?Social Determinants of Health  ? ?Financial Resource Strain: Not on file  ?Food Insecurity: Not on file  ?Transportation Needs: Not on file  ?Physical Activity: Not on file  ?Stress: Not on file  ?Social Connections: Not on file  ?Intimate Partner Violence: Not on file  ? ?History reviewed. No pertinent family history. ? ? ? ?VITAL SIGNS ?BP 132/72   Pulse 82   Temp (!) 97.2  ?F (36.2 ?C)   Resp 18   Wt 128 lb 6.4 oz (58.2 kg)   SpO2 94%   BMI 22.75 kg/m?  ? ?Outpatient Encounter Medications as of 07/25/2021  ?Medication Sig  ? acetaminophen (TYLENOL) 650 MG CR tablet Take 650 mg by mouth every 6 (six) hours.  ? amiodarone (PACERONE) 200 MG tablet Take 1 tablet (200 mg total) by mouth daily.  ? amLODipine (NORVASC) 5 MG tablet Take 1 tablet (5 mg total) by mouth daily.  ? apixaban (ELIQUIS) 2.5 MG TABS tablet Take 2.5 mg by mouth 2 (two) times daily.  ? Lidocaine 4 % PTCH Apply 2 patches topically. to lower back daily for pain management.  ? Magnesium Hydroxide (MILK OF MAGNESIA PO) Take by mouth. 30 ml by mouth daily as needed for constipation.  ? metoprolol succinate (TOPROL-XL) 25 MG 24 hr tablet Take 0.5 tablets (12.5 mg total) by mouth daily.  ? NON FORMULARY Diet:Regular  ? omeprazole (PRILOSEC) 40 MG capsule Take 1 capsule (40 mg total) by mouth daily.  ? potassium chloride (MICRO-K) 10 MEQ CR capsule Take 1 capsule (10 mEq total) by mouth daily.  ? ?No facility-administered encounter medications on file as of 07/25/2021.  ? ? ? ?SIGNIFICANT DIAGNOSTIC EXAMS ? ? ?PREVIOUS  ?  ?  06-21-21: right hip x-ray:  ?1. Acute nondisplaced fractures of the right inferior pubic ramus, left superior pubic ramus and left parasymphyseal pubic bone. ?2. Subacute or possibly acute fracture in the right sacrum, likely insufficiency fracture. ?3. Osteopenia and other findings as described. ?  ?06-21-21: ct of head and cervical spine:  ?No acute intracranial abnormality. Small right-sided scalp hematoma. Chronic small vessel ischemic disease. ?No acute cervical spine fracture. Multilevel degenerative disc disease and facet arthropathy ?  ?07-10-21: chest x-ray:  ?Opacification left hemidiaphragm with mild patchy density compatible with left basilar pneumonia small left pleural effusion ?Mild cardiomegaly ?Mild degree osteopenia ?Mild osteoarthritis  ? ?07-17-21: chest x-ray: improvement in left base  pneumonia  ? ?TODAY ? ?07-21-21: pelvic x-ray:  ?Degenerative changes of lumbar spine ?Bilateral fracture of superior/inferior pubic rami appear acute ?Hip unremarkable.  ? ?07-25-21: ct of pelvis:  ?1. Subacute comminuted right sacral insufficiency fracture with sclerosis along the fracture cleft. ?2. Acute nondisplaced mildly comminuted left sacral insufficiency fracture. ?3. Ununited left inferior pubic ramus fracture with mild surrounding callus formation. ?4. Severely comminuted left superior pubic ramus fracture involving the pubic body. ?5. Mildly comminuted right superior pubic ramus-acetabular junction fracture with mild surrounding callus formation. ?6. Ununited mildly comminuted right inferior pubic ramus fracture. ?7. Moderate osteoarthritis of the right hip with small marginal osteophytes. ?8. Given the patient's age and osteopenia, if there is persistent clinical concern for an occult hip fracture, a MRI of the hip is recommended for increased sensitivity. ?  ?LABS REVIEWED: PREVIOUS  ?  ?06-21-21 wbc 9.1; hgb 11.5; hct 34.6; mcv 100.6 plt 268; glucose 124; bun 38; creat 1.93; k+ 4.1; na++ 139; ca 8.6; GFR 24 ?06-22-21: vitamin B12: 506; folate 24.1 ?06-23-21: wbc 8.8; hgb 10.1; hct 30.1; mcv 101.7 plt 234; glucose 108; bun 37; creat 2.04; k+ 4.7; na++ 138; ca 8.6; GFR 32; mag 2.1  ?  ?TODAY ? ?07-10-21; wbc 9.3; hgb 11.1; hct 32.4; mcv 98.8 plt 415; glucose 109; bun 24; creat 1.58; k+ 4.2; na++ 134; ca 8.6; GFR 31; d-dimer 4.34; CRP 8.1 ?07-18-21: CRP 20.8; d-dimer 5.87 sed rate: 40. ? ?Review of Systems  ?Constitutional:  Negative for malaise/fatigue.  ?Respiratory:  Negative for cough and shortness of breath.   ?Cardiovascular:  Negative for chest pain, palpitations and leg swelling.  ?Gastrointestinal:  Negative for abdominal pain, constipation and heartburn.  ?Musculoskeletal:  Positive for back pain and joint pain. Negative for myalgias.  ?Skin: Negative.   ?Neurological:  Negative for dizziness.   ?Psychiatric/Behavioral:  The patient is not nervous/anxious.   ? ? ?Physical Exam ?Constitutional:   ?   General: She is not in acute distress. ?   Appearance: She is well-developed. She is not diaphoretic.  ?Neck:  ?   Thyroid: No thyromegaly.  ?Cardiovascular:  ?   Rate and Rhythm: Normal rate and regular rhythm.  ?   Pulses: Normal pulses.  ?   Heart sounds: Normal heart sounds.  ?Pulmonary:  ?   Effort: Pulmonary effort is normal. No respiratory distress.  ?   Breath sounds: Normal breath sounds.  ?Abdominal:  ?   General: Bowel sounds are normal. There is no distension.  ?   Palpations: Abdomen is soft.  ?   Tenderness: There is no abdominal tenderness.  ?Musculoskeletal:     ?   General: Normal range of motion.  ?   Cervical back: Neck supple.  ?   Right lower leg: No edema.  ?   Left  lower leg: No edema.  ?Lymphadenopathy:  ?   Cervical: No cervical adenopathy.  ?Skin: ?   General: Skin is warm and dry.  ?Neurological:  ?   Mental Status: She is alert. Mental status is at baseline.  ?Psychiatric:     ?   Mood and Affect: Mood normal.  ? ? ? ? ?ASSESSMENT/ PLAN: ? ?TODAY ? ?Multiple closed fractures of pelvis without disruption of pelvic ring sequela/sacral fractures will setup MRI of hips. Will continue to monitor her status. Will get tsh free t4; vitamin d; ionized calcium and intact pth.  ? ? ?Ok Edwards NP ?[facility]  ?call [phone]  ? ?

## 2021-07-26 ENCOUNTER — Ambulatory Visit (HOSPITAL_COMMUNITY)
Admission: RE | Admit: 2021-07-26 | Discharge: 2021-07-26 | Disposition: A | Discharge status: Home / Self Care | Payer: Medicare Other | Source: Ambulatory Visit | Attending: Adult Health | Admitting: Adult Health

## 2021-07-26 ENCOUNTER — Other Ambulatory Visit: Payer: Self-pay | Admitting: Adult Health

## 2021-07-26 ENCOUNTER — Other Ambulatory Visit (HOSPITAL_COMMUNITY): Payer: Self-pay | Admitting: Adult Health

## 2021-07-26 DIAGNOSIS — S32511A Fracture of superior rim of right pubis, initial encounter for closed fracture: Secondary | ICD-10-CM | POA: Diagnosis not present

## 2021-07-26 DIAGNOSIS — M1612 Unilateral primary osteoarthritis, left hip: Secondary | ICD-10-CM

## 2021-07-26 DIAGNOSIS — S329XXS Fracture of unspecified parts of lumbosacral spine and pelvis, sequela: Secondary | ICD-10-CM

## 2021-07-26 DIAGNOSIS — R935 Abnormal findings on diagnostic imaging of other abdominal regions, including retroperitoneum: Secondary | ICD-10-CM | POA: Diagnosis not present

## 2021-07-26 DIAGNOSIS — M25451 Effusion, right hip: Secondary | ICD-10-CM | POA: Diagnosis not present

## 2021-07-26 DIAGNOSIS — S3282XA Multiple fractures of pelvis without disruption of pelvic ring, initial encounter for closed fracture: Secondary | ICD-10-CM | POA: Diagnosis not present

## 2021-07-26 DIAGNOSIS — R188 Other ascites: Secondary | ICD-10-CM | POA: Diagnosis not present

## 2021-07-26 DIAGNOSIS — M25452 Effusion, left hip: Secondary | ICD-10-CM | POA: Diagnosis not present

## 2021-07-26 DIAGNOSIS — S32512A Fracture of superior rim of left pubis, initial encounter for closed fracture: Secondary | ICD-10-CM | POA: Diagnosis not present

## 2021-07-26 IMAGING — MR MR PELVIS W/O CM
5 series · 44 of 48 positions shown · non-contrast
Comparison: CT pelvis [DATE] and [DATE]; right hip
radiographs [DATE]

CLINICAL DATA: Multiple known pelvic fractures on CT.
Osteoarthritis.

EXAM:
MR OF THE RIGHT HIP WITHOUT CONTRAST
TECHNIQUE: Multiplanar, multisequence MR imaging was performed. No intravenous
contrast was administered. The technologist reports the patient
could not hold still. Repeats were not tolerated due to pain. Best
study possible.

[Series 8: T1 · coronal · 4.0mm · 0.82mm/px · 6 of 33 slices shown]
[im 1/33]
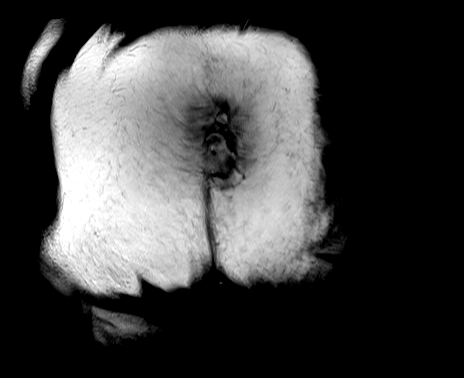
[im 7/33]
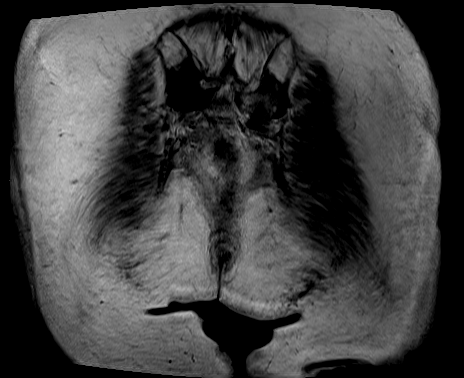
[im 13/33]
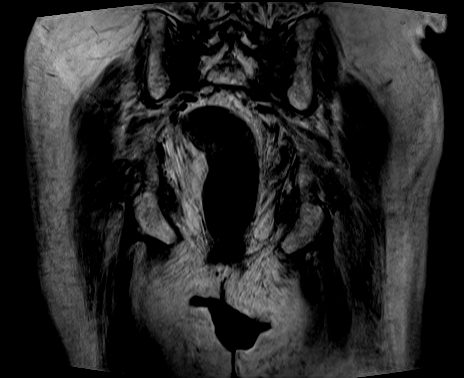
[im 20/33]
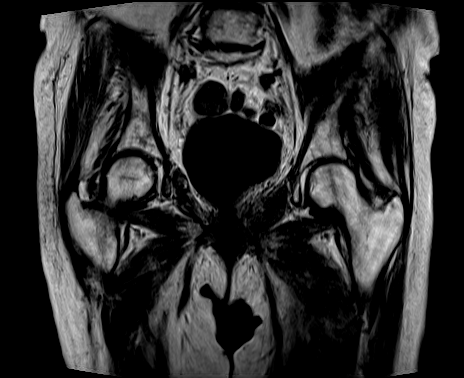
[im 26/33]
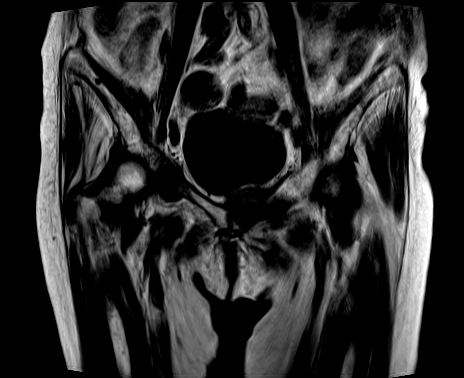
[im 33/33]
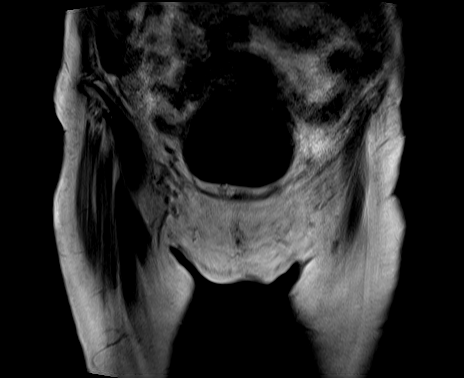

[Series 9: STIR · coronal · 4.0mm · 0.99mm/px · 7 of 33 slices shown]
[im 1/33]
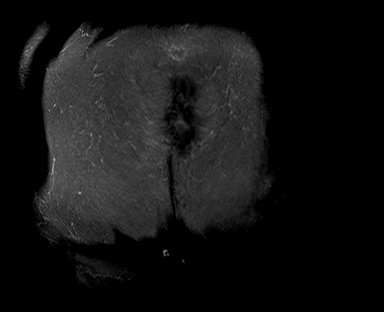
[im 6/33]
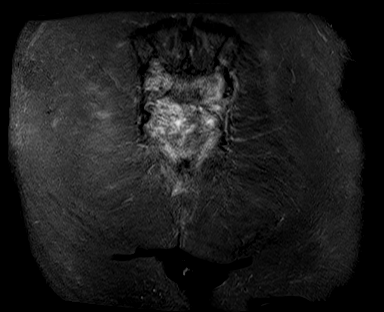
[im 11/33]
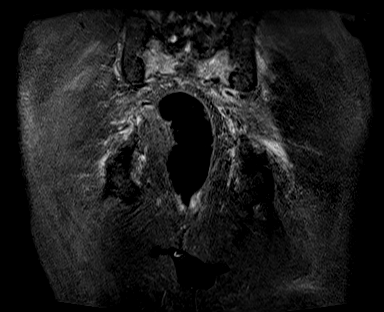
[im 17/33]
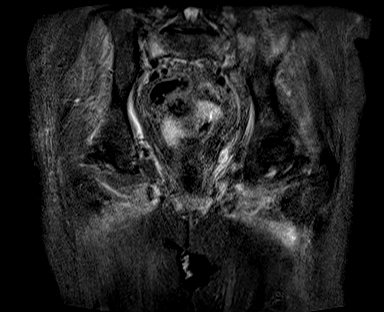
[im 22/33]
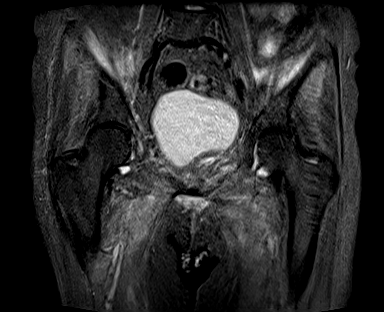
[im 27/33]
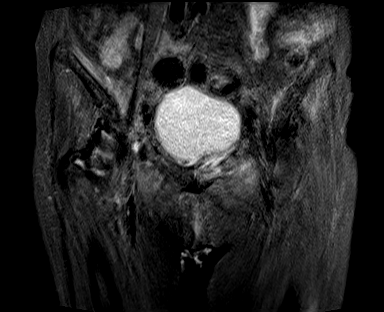
[im 33/33]
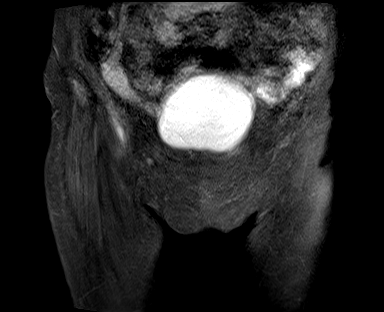

[Series 10: T2 fat-sat · axial · 4.0mm · 1.12mm/px · z∈[-72,+143]mm · 9 of 44 slices shown (1 of 3)]
[im 1/44]
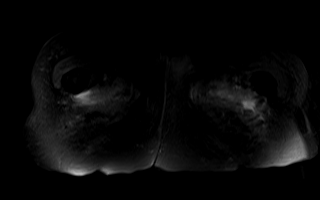
[im 6/44]
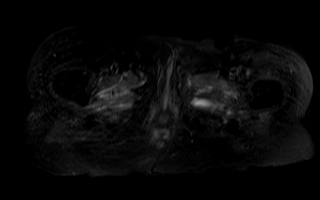
[im 11/44]
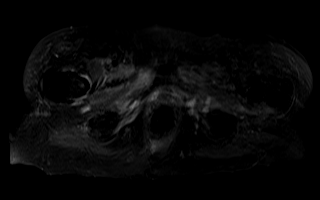
[im 17/44]
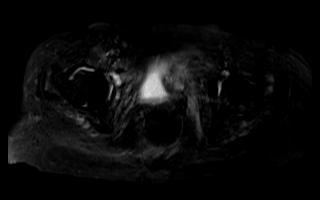
[im 22/44]
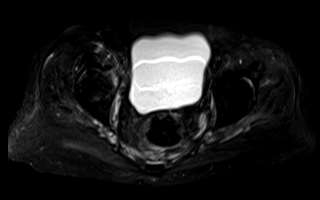
[im 27/44]
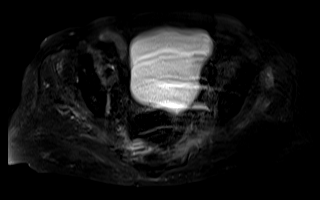
[im 33/44]
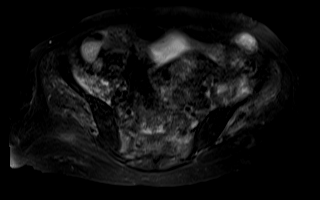
[im 38/44]
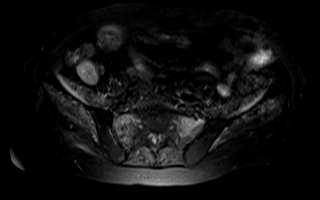
[im 44/44]
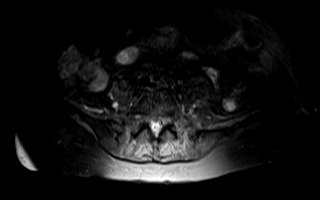

[Series 12: T2 fat-sat · sagittal · 4.0mm · 0.94mm/px · 13 of 64 slices shown (2 of 3)]
[im 1/64]
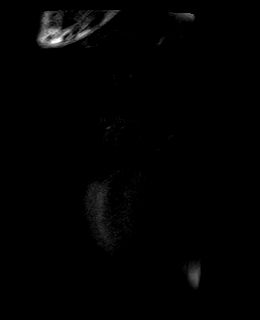
[im 6/64]
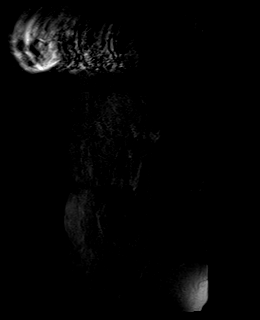
[im 11/64]
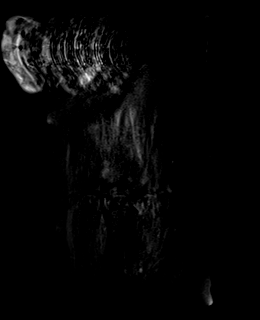
[im 16/64]
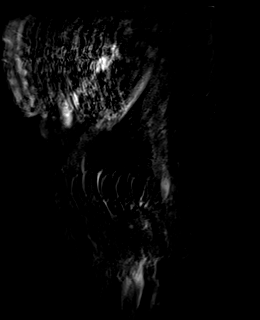
[im 22/64]
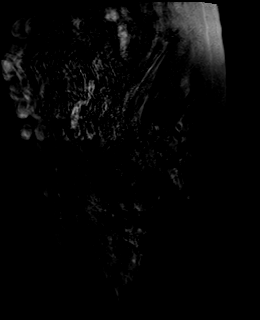
[im 27/64]
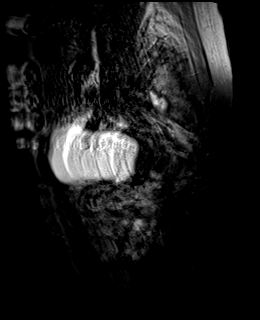
[im 32/64]
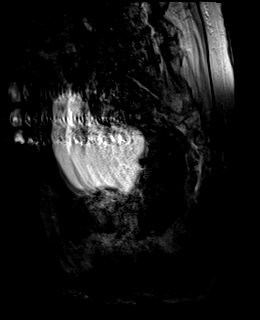
[im 37/64]
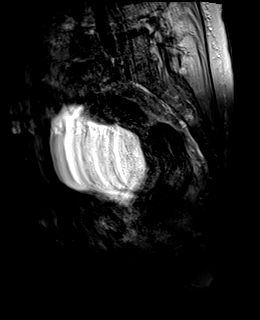
[im 43/64]
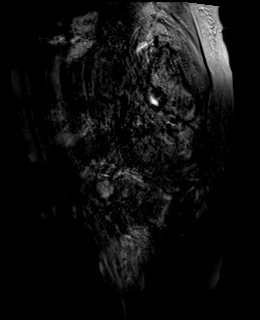
[im 48/64]
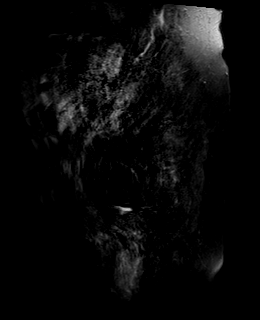
[im 53/64]
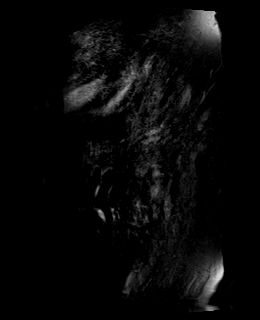
[im 58/64]
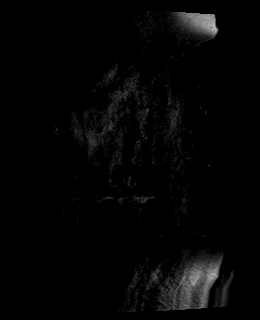
[im 64/64]
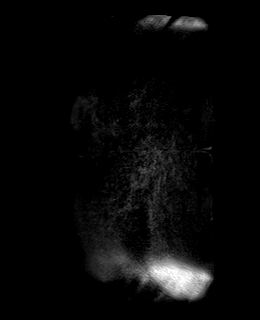

[Series 13: T2 fat-sat · sagittal · 4.0mm · 0.94mm/px · 9 of 64 slices shown (3 of 3)]
[im 1/64]
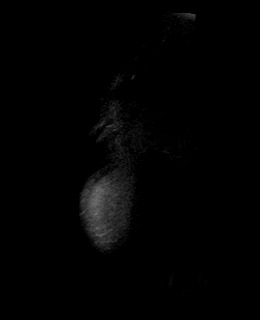
[im 6/64]
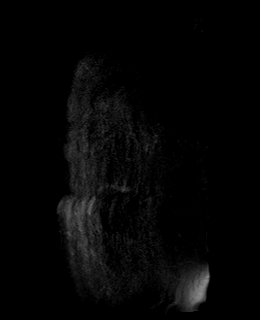
[im 11/64]
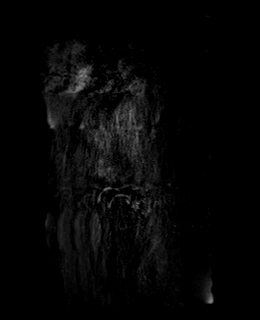
[im 22/64]
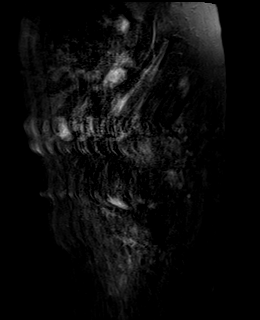
[im 27/64]
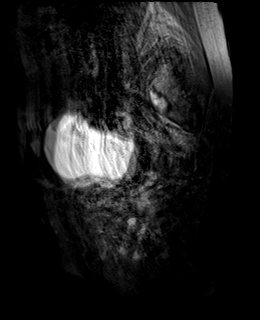
[im 37/64]
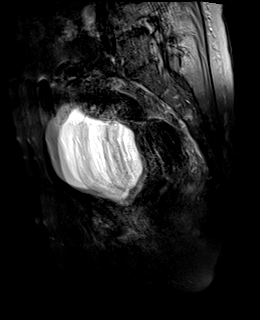
[im 43/64]
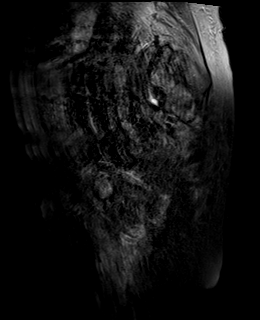
[im 53/64]
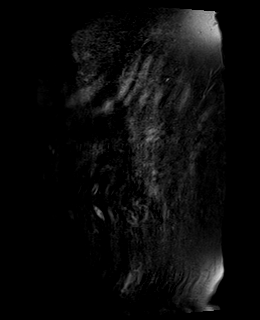
[im 64/64]
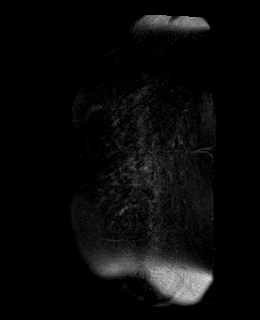

[44 of 48 positions shown; findings below may reference images not displayed]

FINDINGS: Despite efforts by the technologist and patient, motion artifact is
present on today's exam and could not be eliminated. This reduces
exam sensitivity and specificity.

Bones: There is diffuse marrow edema throughout the right and left
sacral alae, with curvilinear decreased T1 and decreased T2 signal
indicating a nondisplaced superior and inferior right and superior
and inferior left sacral alae acute to subacute insufficiency
fractures. There is mild fluid deep to the bilateral iliacus
musculature along the bilateral iliac wings.

There is high-grade marrow edema and peripheral periosteal edema
around the mild displaced bilateral inferior pubic rami fractures,
markedly comminuted left superior pubic ramus fracture involving the
left pubic body and fracture involving the junction of the right
superior pubic ramus and the right acetabulum.

Grade 1 anterolisthesis of L4 on L5. Moderate to severe L5-S1
degenerative disc space narrowing.

Articular cartilage and labrum

Articular cartilage: Motion artifact limits evaluation but there
appears to be moderate right-greater-than-left femoroacetabular
joint space narrowing, cartilage thinning and peripheral
degenerative osteophytes.

Labrum:  Not well evaluated.

Joint or bursal effusion

Joint effusion:  Borderline mild bilateral hip effusions.

Bursae: No trochanteric bursitis on either side.

Muscles and tendons

Muscles and tendons: The origins of the bilateral rectus femoris and
common hamstring tendons are intact. The insertions of the bilateral
iliopsoas, gluteus minimus, and gluteus medius tendons are intact.

There is moderate edema throughout the bilateral adductor
musculature likely secondary to muscle strains and the regional
bilateral superior and inferior pubic rami fractures.

Other findings

Miscellaneous: Within the limitations of motion artifact, scattered
diverticula are seen within the sigmoid colon.
IMPRESSION: :
IMPRESSION: 1. Redemonstration of bilateral sacral alae, bilateral inferior
pubic ramus, left superior pubic ramus and adjacent pubic body and
right superior pubic ramus-acetabular junction acute to subacute
fractures.
2. Moderate right-greater-than-left femoroacetabular osteoarthritis.
No acute fracture is seen within either proximal femur.
3. Edema throughout the bilateral adductor groin musculature
prominent likely secondary to muscle strains in the adjacent pelvic
fractures.

## 2021-07-26 IMAGING — MR MR HIP*L* W/O CM
8 of 9 series · 26 of 40 positions shown · non-contrast
Comparison: CT pelvis [DATE] and [DATE]; right hip
radiographs [DATE]

CLINICAL DATA: Multiple known pelvic fractures on CT.
Osteoarthritis.

EXAM:
MR OF THE RIGHT HIP WITHOUT CONTRAST
TECHNIQUE: Multiplanar, multisequence MR imaging was performed. No intravenous
contrast was administered. The technologist reports the patient
could not hold still. Repeats were not tolerated due to pain. Best
study possible.

[Series 8: T1 · coronal · 4.0mm · 0.82mm/px · 3 of 33 slices shown (1 of 2)]
[im 1/33]
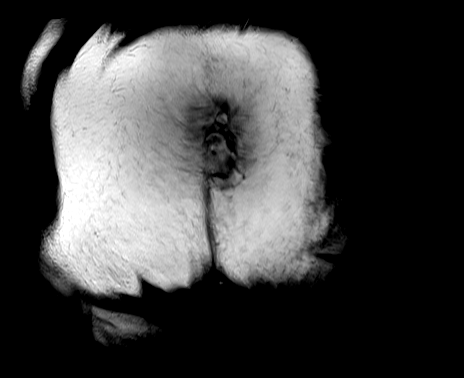
[im 17/33]
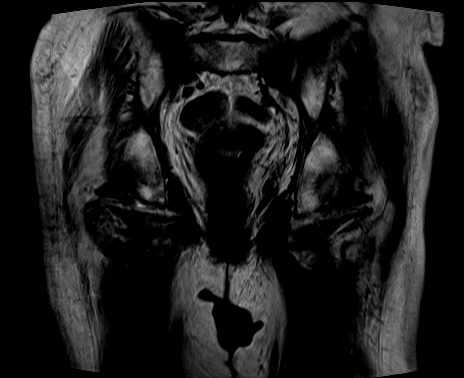
[im 33/33]
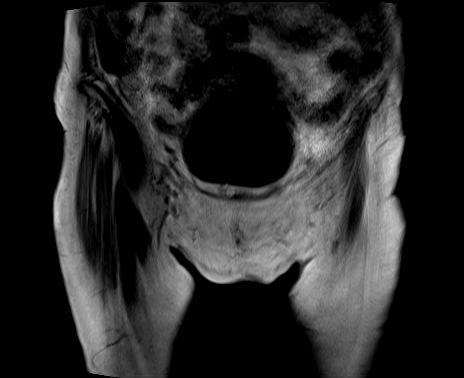

[Series 9: STIR · coronal · 4.0mm · 0.99mm/px · 3 of 33 slices shown]
[im 1/33]
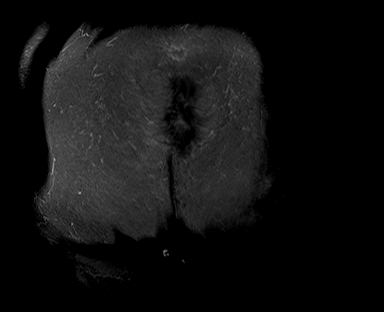
[im 17/33]
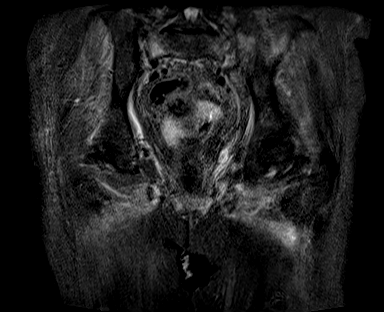
[im 33/33]
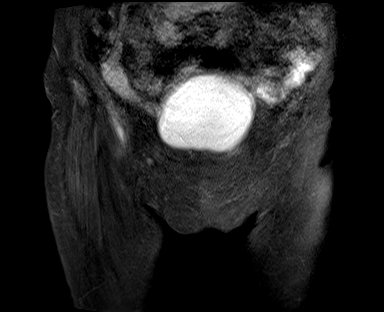

[Series 10: T2 fat-sat · axial · 4.0mm · 1.12mm/px · z∈[-72,+143]mm · 3 of 44 slices shown (1 of 3)]
[im 1/44]
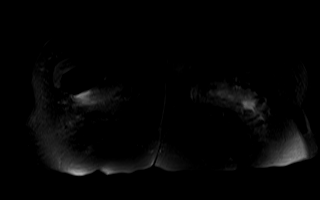
[im 22/44]
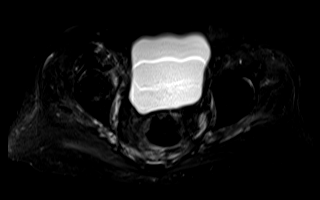
[im 44/44]
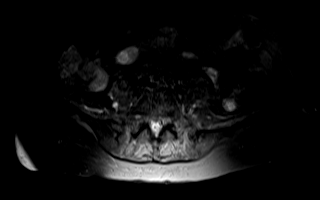

[Series 11: T1 · axial · 4.0mm · 1.12mm/px · z∈[-72,+143]mm · 3 of 44 slices shown (2 of 2)]
[im 1/44]
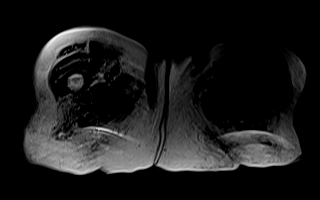
[im 22/44]
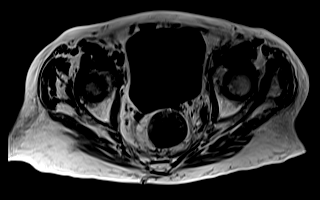
[im 44/44]
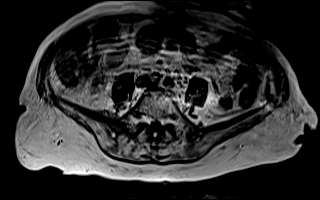

[Series 12: T2 fat-sat · sagittal · 4.0mm · 0.94mm/px · 5 of 64 slices shown (2 of 3)]
[im 1/64]
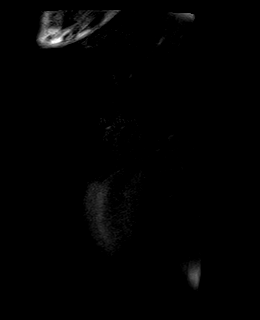
[im 16/64]
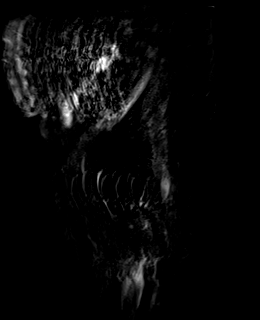
[im 32/64]
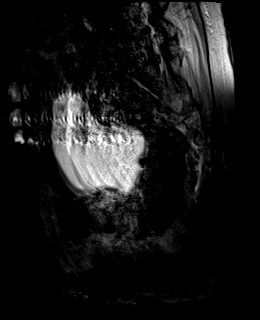
[im 48/64]
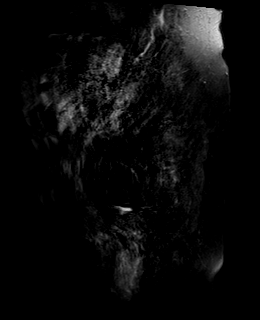
[im 64/64]
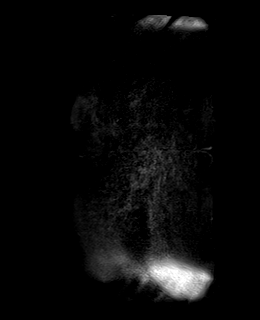

[Series 13: T2 fat-sat · sagittal · 4.0mm · 0.94mm/px · 5 of 64 slices shown (3 of 3)]
[im 1/64]
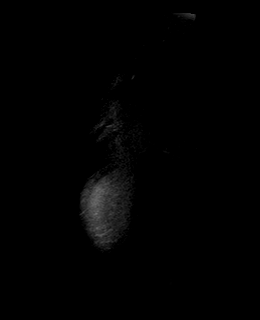
[im 16/64]
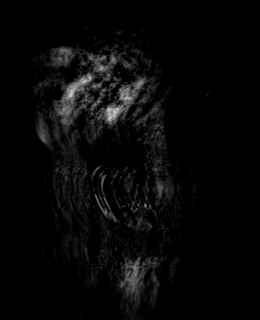
[im 32/64]
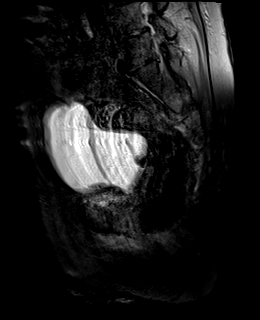
[im 48/64]
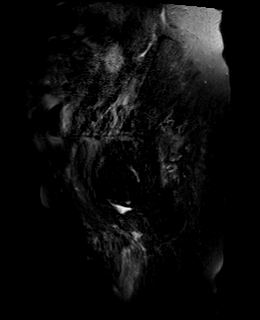
[im 64/64]
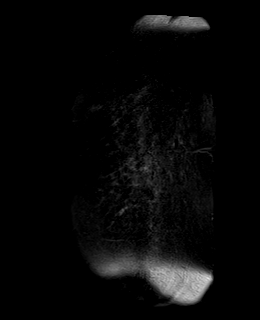

[Series 15: PD fat-sat · sagittal · 4.0mm · 0.70mm/px · 2 of 25 slices shown (1 of 2)]
[im 1/25]
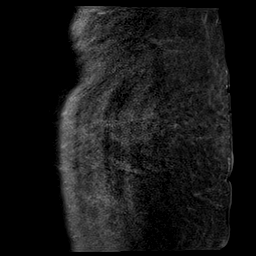
[im 25/25]
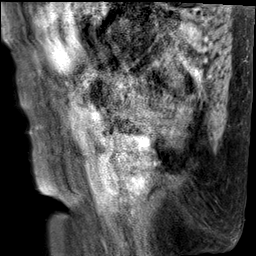

[Series 17: PD fat-sat · coronal · 4.0mm · 0.70mm/px · 2 of 20 slices shown (2 of 2)]
[im 1/20]
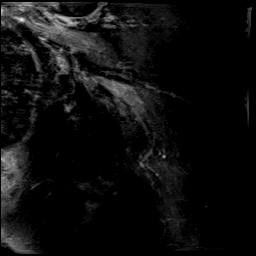
[im 20/20]
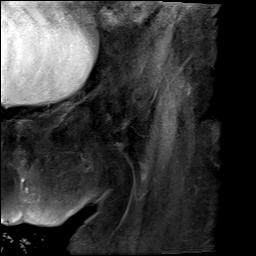

[26 of 40 positions shown; findings below may reference images not displayed]

FINDINGS: Despite efforts by the technologist and patient, motion artifact is
present on today's exam and could not be eliminated. This reduces
exam sensitivity and specificity.

Bones: There is diffuse marrow edema throughout the right and left
sacral alae, with curvilinear decreased T1 and decreased T2 signal
indicating a nondisplaced superior and inferior right and superior
and inferior left sacral alae acute to subacute insufficiency
fractures. There is mild fluid deep to the bilateral iliacus
musculature along the bilateral iliac wings.

There is high-grade marrow edema and peripheral periosteal edema
around the mild displaced bilateral inferior pubic rami fractures,
markedly comminuted left superior pubic ramus fracture involving the
left pubic body and fracture involving the junction of the right
superior pubic ramus and the right acetabulum.

Grade 1 anterolisthesis of L4 on L5. Moderate to severe L5-S1
degenerative disc space narrowing.

Articular cartilage and labrum

Articular cartilage: Motion artifact limits evaluation but there
appears to be moderate right-greater-than-left femoroacetabular
joint space narrowing, cartilage thinning and peripheral
degenerative osteophytes.

Labrum:  Not well evaluated.

Joint or bursal effusion

Joint effusion:  Borderline mild bilateral hip effusions.

Bursae: No trochanteric bursitis on either side.

Muscles and tendons

Muscles and tendons: The origins of the bilateral rectus femoris and
common hamstring tendons are intact. The insertions of the bilateral
iliopsoas, gluteus minimus, and gluteus medius tendons are intact.

There is moderate edema throughout the bilateral adductor
musculature likely secondary to muscle strains and the regional
bilateral superior and inferior pubic rami fractures.

Other findings

Miscellaneous: Within the limitations of motion artifact, scattered
diverticula are seen within the sigmoid colon.
IMPRESSION: :
IMPRESSION: 1. Redemonstration of bilateral sacral alae, bilateral inferior
pubic ramus, left superior pubic ramus and adjacent pubic body and
right superior pubic ramus-acetabular junction acute to subacute
fractures.
2. Moderate right-greater-than-left femoroacetabular osteoarthritis.
No acute fracture is seen within either proximal femur.
3. Edema throughout the bilateral adductor groin musculature
prominent likely secondary to muscle strains in the adjacent pelvic
fractures.

## 2021-07-26 IMAGING — MR MR HIP*R* W/O CM
8 series · 40 of 40 positions shown · non-contrast
Comparison: CT pelvis [DATE] and [DATE]; right hip
radiographs [DATE]

CLINICAL DATA: Multiple known pelvic fractures on CT.
Osteoarthritis.

EXAM:
MR OF THE RIGHT HIP WITHOUT CONTRAST
TECHNIQUE: Multiplanar, multisequence MR imaging was performed. No intravenous
contrast was administered. The technologist reports the patient
could not hold still. Repeats were not tolerated due to pain. Best
study possible.

[Series 8: T1 · coronal · 4.0mm · 0.82mm/px · 5 of 33 slices shown (1 of 2)]
[im 1/33]
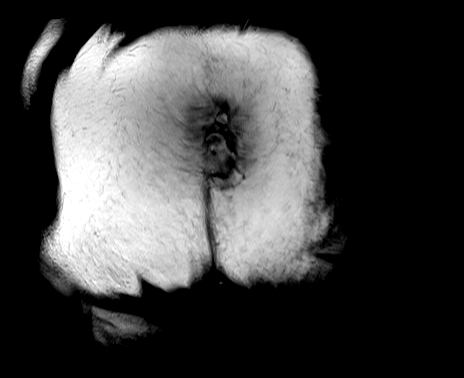
[im 9/33]
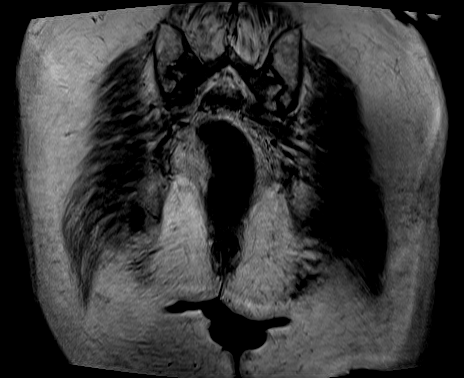
[im 17/33]
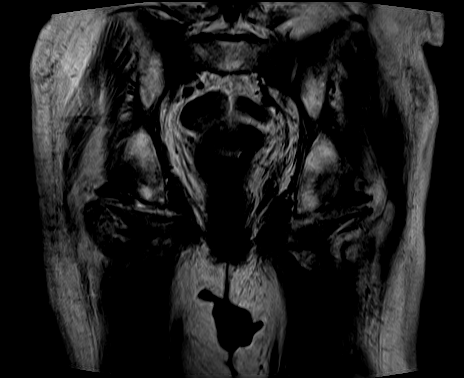
[im 25/33]
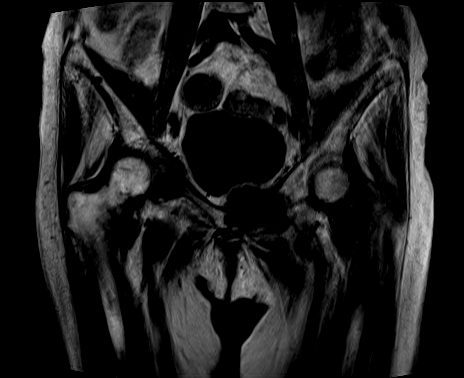
[im 33/33]
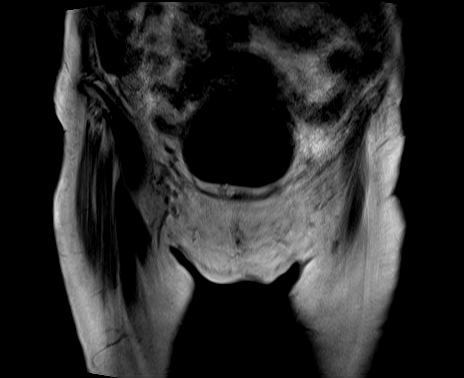

[Series 9: STIR · coronal · 4.0mm · 0.99mm/px · 4 of 33 slices shown]
[im 1/33]
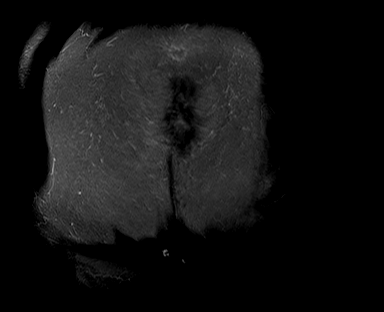
[im 11/33]
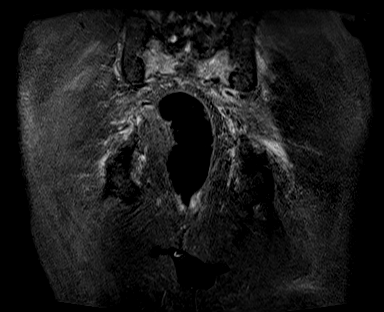
[im 22/33]
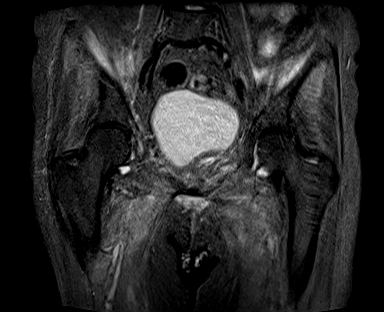
[im 33/33]
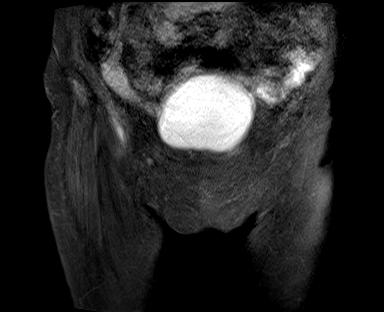

[Series 10: T2 fat-sat · axial · 4.0mm · 1.12mm/px · z∈[-72,+143]mm · 5 of 44 slices shown (1 of 3)]
[im 1/44]
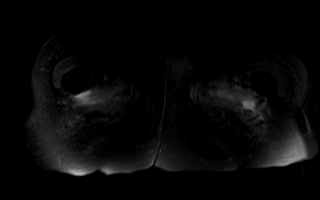
[im 11/44]
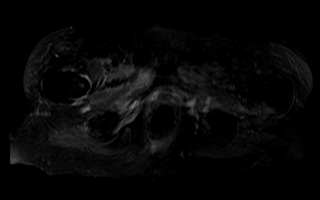
[im 22/44]
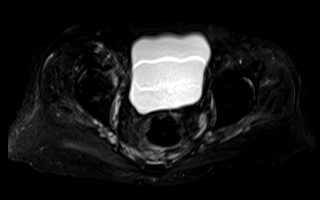
[im 33/44]
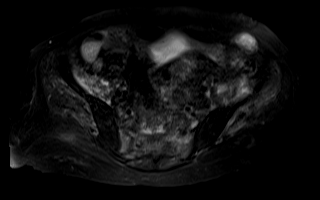
[im 44/44]
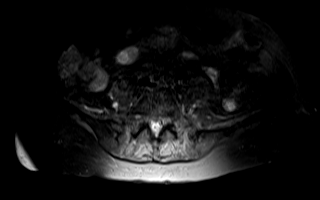

[Series 11: T1 · axial · 4.0mm · 1.12mm/px · z∈[-72,+143]mm · 5 of 44 slices shown (2 of 2)]
[im 1/44]
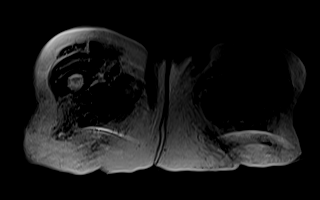
[im 11/44]
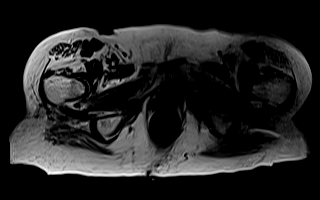
[im 22/44]
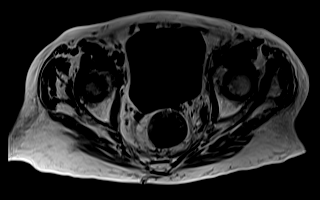
[im 33/44]
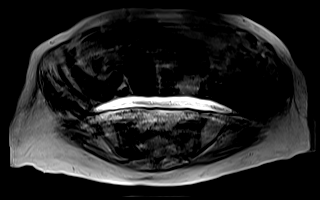
[im 44/44]
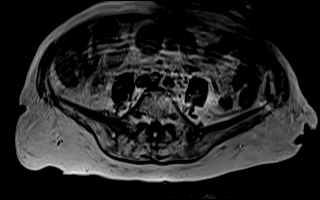

[Series 12: T2 fat-sat · sagittal · 4.0mm · 0.94mm/px · 8 of 64 slices shown (2 of 3)]
[im 1/64]
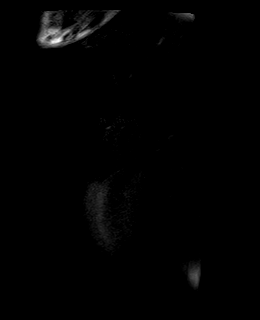
[im 10/64]
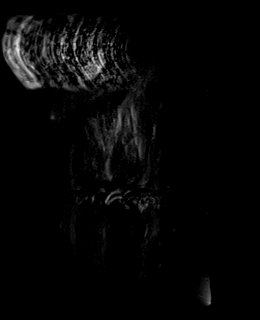
[im 19/64]
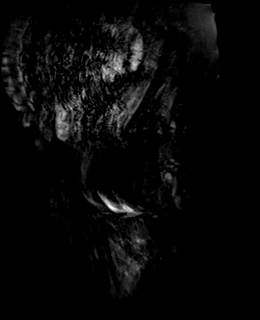
[im 28/64]
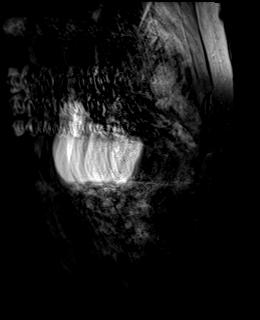
[im 37/64]
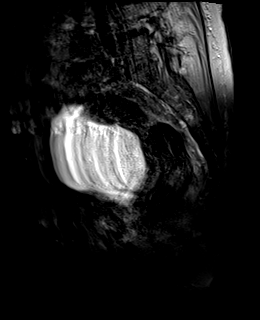
[im 46/64]
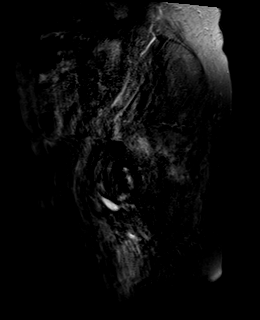
[im 55/64]
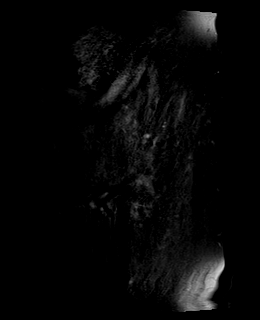
[im 64/64]
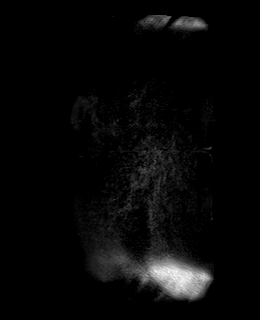

[Series 13: T2 fat-sat · sagittal · 4.0mm · 0.94mm/px · 8 of 64 slices shown (3 of 3)]
[im 1/64]
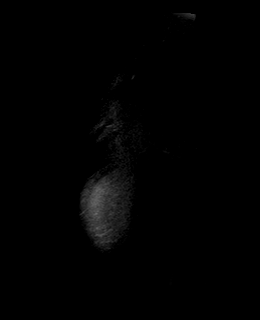
[im 10/64]
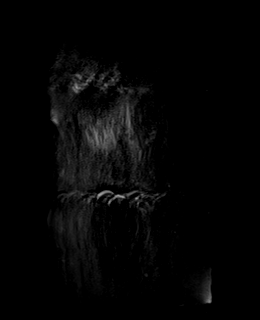
[im 19/64]
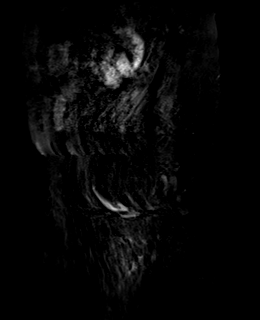
[im 28/64]
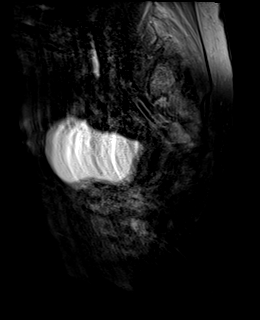
[im 37/64]
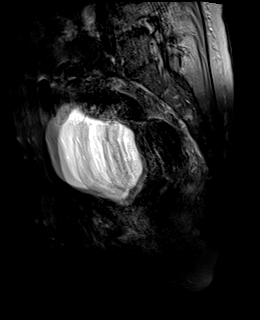
[im 46/64]
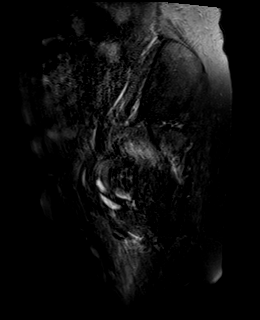
[im 55/64]
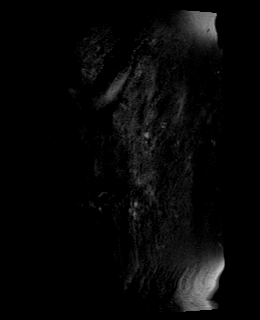
[im 64/64]
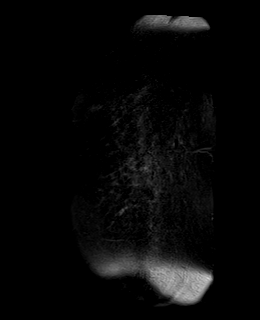

[Series 14: PD fat-sat · sagittal · 4.0mm · 0.70mm/px · 3 of 25 slices shown (1 of 2)]
[im 1/25]
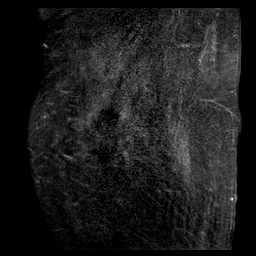
[im 13/25]
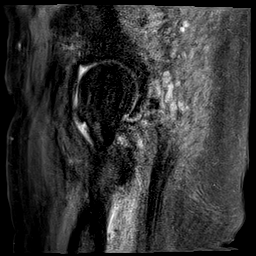
[im 25/25]
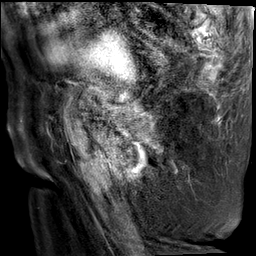

[Series 16: PD fat-sat · coronal · 4.0mm · 0.70mm/px · 2 of 20 slices shown (2 of 2)]
[im 1/20]
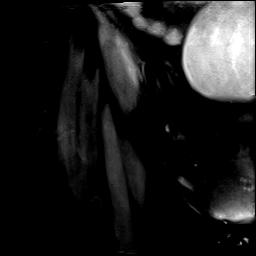
[im 20/20]
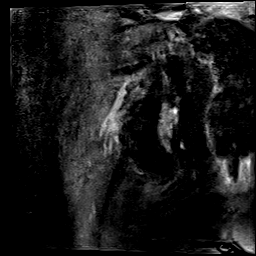

[40 of 40 positions shown; findings below may reference images not displayed]

FINDINGS: Despite efforts by the technologist and patient, motion artifact is
present on today's exam and could not be eliminated. This reduces
exam sensitivity and specificity.

Bones: There is diffuse marrow edema throughout the right and left
sacral alae, with curvilinear decreased T1 and decreased T2 signal
indicating a nondisplaced superior and inferior right and superior
and inferior left sacral alae acute to subacute insufficiency
fractures. There is mild fluid deep to the bilateral iliacus
musculature along the bilateral iliac wings.

There is high-grade marrow edema and peripheral periosteal edema
around the mild displaced bilateral inferior pubic rami fractures,
markedly comminuted left superior pubic ramus fracture involving the
left pubic body and fracture involving the junction of the right
superior pubic ramus and the right acetabulum.

Grade 1 anterolisthesis of L4 on L5. Moderate to severe L5-S1
degenerative disc space narrowing.

Articular cartilage and labrum

Articular cartilage: Motion artifact limits evaluation but there
appears to be moderate right-greater-than-left femoroacetabular
joint space narrowing, cartilage thinning and peripheral
degenerative osteophytes.

Labrum:  Not well evaluated.

Joint or bursal effusion

Joint effusion:  Borderline mild bilateral hip effusions.

Bursae: No trochanteric bursitis on either side.

Muscles and tendons

Muscles and tendons: The origins of the bilateral rectus femoris and
common hamstring tendons are intact. The insertions of the bilateral
iliopsoas, gluteus minimus, and gluteus medius tendons are intact.

There is moderate edema throughout the bilateral adductor
musculature likely secondary to muscle strains and the regional
bilateral superior and inferior pubic rami fractures.

Other findings

Miscellaneous: Within the limitations of motion artifact, scattered
diverticula are seen within the sigmoid colon.
IMPRESSION: :
IMPRESSION: 1. Redemonstration of bilateral sacral alae, bilateral inferior
pubic ramus, left superior pubic ramus and adjacent pubic body and
right superior pubic ramus-acetabular junction acute to subacute
fractures.
2. Moderate right-greater-than-left femoroacetabular osteoarthritis.
No acute fracture is seen within either proximal femur.
3. Edema throughout the bilateral adductor groin musculature
prominent likely secondary to muscle strains in the adjacent pelvic
fractures.

## 2021-07-26 MED ORDER — OXYCODONE HCL 5 MG PO TABS: 2.5000 mg | ORAL_TABLET | Freq: Three times a day (TID) | ORAL | 0 refills | Status: DC | PRN

## 2021-07-27 ENCOUNTER — Encounter: Payer: Self-pay | Admitting: Adult Health

## 2021-07-27 ENCOUNTER — Other Ambulatory Visit (HOSPITAL_COMMUNITY)
Admission: RE | Admit: 2021-07-27 | Discharge: 2021-07-27 | Disposition: A | Discharge status: Home / Self Care | Payer: Medicare Other | Source: Skilled Nursing Facility | Attending: Adult Health | Admitting: Adult Health

## 2021-07-27 ENCOUNTER — Non-Acute Institutional Stay (SKILLED_NURSING_FACILITY): Payer: Medicare Other | Admitting: Adult Health

## 2021-07-27 DIAGNOSIS — M160 Bilateral primary osteoarthritis of hip: Secondary | ICD-10-CM | POA: Diagnosis not present

## 2021-07-27 DIAGNOSIS — S3282XS Multiple fractures of pelvis without disruption of pelvic ring, sequela: Secondary | ICD-10-CM

## 2021-07-27 DIAGNOSIS — S3210XA Unspecified fracture of sacrum, initial encounter for closed fracture: Secondary | ICD-10-CM | POA: Insufficient documentation

## 2021-07-27 DIAGNOSIS — M8000XA Age-related osteoporosis with current pathological fracture, unspecified site, initial encounter for fracture: Secondary | ICD-10-CM | POA: Insufficient documentation

## 2021-07-27 LAB — TSH: TSH: 0.958 u[IU]/mL (ref 0.350–4.500)

## 2021-07-27 LAB — VITAMIN D 25 HYDROXY (VIT D DEFICIENCY, FRACTURES): Vit D, 25-Hydroxy: 64.22 ng/mL (ref 30–100)

## 2021-07-27 LAB — T4, FREE: Free T4: 1.57 ng/dL — ABNORMAL HIGH (ref 0.61–1.12)

## 2021-07-27 NOTE — Progress Notes (Signed)
?Location:  [facility] Room Number: 125-P ?Place of Service:  SNF (31) ? ? ?CODE STATUS: DNR ? ?No Known Allergies ? ?Chief Complaint  ?Patient presents with  ? Acute Visit  ?  Follow-up MRI results   ? ? ?HPI: ? ?She had an MRI to follow up `her CT scan. There are subacute and acute fractures present. There is osteoarthritis present in both hips right >left. She continues to have pain. She has oxycodone 2.5 mg available as needed. She continues to spend most of her time in bed.  ? ?Past Medical History:  ?Diagnosis Date  ? Arthritis   ? GERD (gastroesophageal reflux disease)   ? Hypertension   ? ? ?Past Surgical History:  ?Procedure Laterality Date  ? ABDOMINAL HYSTERECTOMY    ? [place]  ? BREAST SURGERY Left   ? benign  ? CATARACT EXTRACTION W/PHACO Right 08/03/2013  ? Procedure: CATARACT EXTRACTION PHACO AND INTRAOCULAR LENS PLACEMENT (IOC);  Surgeon: Tonny Branch, MD;  Location: [facility];  Service: Ophthalmology;  Laterality: Right;  CDE 13.72  ? CHOLECYSTECTOMY    ? ? ?Social History  ? ?Socioeconomic History  ? Marital status: Single  ?  Spouse name: Not on file  ? Number of children: Not on file  ? Years of education: Not on file  ? Highest education level: Not on file  ?Occupational History  ? Not on file  ?Tobacco Use  ? Smoking status: Never  ? Smokeless tobacco: Never  ?Vaping Use  ? Vaping Use: Never used  ?Substance and Sexual Activity  ? Alcohol use: No  ? Drug use: No  ? Sexual activity: Yes  ?  Birth control/protection: Surgical  ?Other Topics Concern  ? Not on file  ?Social History Narrative  ? ** Merged History Encounter **  ?    ? ?Social Determinants of Health  ? ?Financial Resource Strain: Not on file  ?Food Insecurity: Not on file  ?Transportation Needs: Not on file  ?Physical Activity: Not on file  ?Stress: Not on file  ?Social Connections: Not on file  ?Intimate Partner Violence: Not on file  ? ?History reviewed. No pertinent family history. ? ? ? ?VITAL SIGNS ?BP 132/72    Pulse 82   Temp 97.8 ?F (36.6 ?C)   Resp 18   Ht 5\' 3"  (1.6 m)   Wt 129 lb 1.6 oz (58.6 kg)   SpO2 94%   BMI 22.87 kg/m?  ? ?Outpatient Encounter Medications as of 07/27/2021  ?Medication Sig  ? acetaminophen (TYLENOL) 650 MG CR tablet Take 650 mg by mouth every 6 (six) hours.  ? amiodarone (PACERONE) 200 MG tablet Take 1 tablet (200 mg total) by mouth daily.  ? amLODipine (NORVASC) 5 MG tablet Take 1 tablet (5 mg total) by mouth daily.  ? apixaban (ELIQUIS) 2.5 MG TABS tablet Take 2.5 mg by mouth 2 (two) times daily.  ? Lidocaine 4 % PTCH Apply 2 patches topically. to lower back daily for pain management.  ? metoprolol succinate (TOPROL-XL) 25 MG 24 hr tablet Take 0.5 tablets (12.5 mg total) by mouth daily.  ? NON FORMULARY Diet:Regular  ? omeprazole (PRILOSEC) 40 MG capsule Take 1 capsule (40 mg total) by mouth daily.  ? oxyCODONE (ROXICODONE) 5 MG immediate release tablet Take 0.5 tablets (2.5 mg total) by mouth every 8 (eight) hours as needed for severe pain.  ? potassium chloride (MICRO-K) 10 MEQ CR capsule Take 1 capsule (10 mEq total) by mouth daily.  ?  Magnesium Hydroxide (MILK OF MAGNESIA PO) Take by mouth. 30 ml by mouth daily as needed for constipation.  ? ?No facility-administered encounter medications on file as of 07/27/2021.  ? ? ? ?SIGNIFICANT DIAGNOSTIC EXAMS ? ? ?PREVIOUS  ?  ?06-21-21: right hip x-ray:  ?1. Acute nondisplaced fractures of the right inferior pubic ramus, left superior pubic ramus and left parasymphyseal pubic bone. ?2. Subacute or possibly acute fracture in the right sacrum, likely insufficiency fracture. ?3. Osteopenia and other findings as described. ?  ?06-21-21: ct of head and cervical spine:  ?No acute intracranial abnormality. Small right-sided scalp hematoma. Chronic small vessel ischemic disease. ?No acute cervical spine fracture. Multilevel degenerative disc disease and facet arthropathy ?  ?07-10-21: chest x-ray:  ?Opacification left hemidiaphragm with mild patchy  density compatible with left basilar pneumonia small left pleural effusion ?Mild cardiomegaly ?Mild degree osteopenia ?Mild osteoarthritis  ? ?07-17-21: chest x-ray: improvement in left base pneumonia  ? ?TODAY ? ?07-21-21: pelvic x-ray:  ?Degenerative changes of lumbar spine ?Bilateral fracture of superior/inferior pubic rami appear acute ?Hip unremarkable.  ? ?07-25-21: ct of pelvis:  ?1. Subacute comminuted right sacral insufficiency fracture with sclerosis along the fracture cleft. ?2. Acute nondisplaced mildly comminuted left sacral insufficiency fracture. ?3. Ununited left inferior pubic ramus fracture with mild surrounding callus formation. ?4. Severely comminuted left superior pubic ramus fracture involving the pubic body. ?5. Mildly comminuted right superior pubic ramus-acetabular junction fracture with mild surrounding callus formation. ?6. Ununited mildly comminuted right inferior pubic ramus fracture. ?7. Moderate osteoarthritis of the right hip with small marginal osteophytes. ?8. Given the patient's age and osteopenia, if there is persistent clinical concern for an occult hip fracture, a MRI of the hip is recommended for increased sensitivity. ? ?07-26-21: MRI hips ?1. Redemonstration of bilateral sacral alae, bilateral inferior pubic ramus, left superior pubic ramus and adjacent pubic body and right superior pubic ramus-acetabular junction acute to subacute fractures. ?2. 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 No acute fracture is seen within either proximal femur. ?3. Edema throughout the bilateral adductor groin musculature prominent likely secondary to muscle strains in the adjacent pelvic fractures. ? ?Review of Systems  ?Constitutional:  Negative for malaise/fatigue.  ?Respiratory:  Negative for cough and shortness of breath.   ?Cardiovascular:  Negative for chest pain, palpitations and leg swelling.  ?Gastrointestinal:  Negative for abdominal pain, constipation and  heartburn.  ?Musculoskeletal:  Positive for back pain and joint pain. Negative for myalgias.  ?Skin: Negative.   ?Neurological:  Negative for dizziness.  ?Psychiatric/Behavioral:  The patient is not nervous/anxious.   ? ?Physical Exam ?Constitutional:   ?   General: She is not in acute distress. ?   Appearance: She is well-developed. She is not diaphoretic.  ?Neck:  ?   Thyroid: No thyromegaly.  ?Cardiovascular:  ?   Rate and Rhythm: Normal rate and regular rhythm.  ?   Pulses: Normal pulses.  ?   Heart sounds: Normal heart sounds.  ?Pulmonary:  ?   Effort: Pulmonary effort is normal. No respiratory distress.  ?   Breath sounds: Normal breath sounds.  ?Abdominal:  ?   General: Bowel sounds are normal. There is no distension.  ?   Palpations: Abdomen is soft.  ?   Tenderness: There is no abdominal tenderness.  ?Musculoskeletal:     ?   General: Normal range of motion.  ?   Cervical back: Neck supple.  ?   Right lower leg: No edema.  ?   Left lower leg:  No edema.  ?Lymphadenopathy:  ?   Cervical: No cervical adenopathy.  ?Skin: ?   General: Skin is warm and dry.  ?Neurological:  ?   Mental Status: She is alert. Mental status is at baseline.  ?Psychiatric:     ?   Mood and Affect: Mood normal.  ? ? ? ?ASSESSMENT/ PLAN: ? ?TODAY ? ?Multiple closed fracture of pelvis without disruption of pelvic ring sequela ?Primary osteoarthritis bilateral hips ? ?Will continue current pain regimen, she is not a candidate for NSAIDS due to her poor renal function; ultram has been ineffective in managing her pain.  ? ? ?Ok Edwards NP ?[facility]  ? call [phone]  ? ?

## 2021-07-28 LAB — PTH, INTACT AND CALCIUM
Calcium, Total (PTH): 8.3 mg/dL — ABNORMAL LOW (ref 8.7–10.3)
PTH: 30 pg/mL (ref 15–65)

## 2021-07-28 LAB — CALCIUM, IONIZED: Calcium, Ionized, Serum: 4.7 mg/dL (ref 4.5–5.6)

## 2021-07-31 DIAGNOSIS — M160 Bilateral primary osteoarthritis of hip: Secondary | ICD-10-CM | POA: Insufficient documentation

## 2021-08-04 ENCOUNTER — Ambulatory Visit (INDEPENDENT_AMBULATORY_CARE_PROVIDER_SITE_OTHER): Payer: Medicare Other | Admitting: Orthopedic Surgery

## 2021-08-04 ENCOUNTER — Ambulatory Visit: Payer: Medicare Other

## 2021-08-04 ENCOUNTER — Ambulatory Visit (INDEPENDENT_AMBULATORY_CARE_PROVIDER_SITE_OTHER): Payer: Medicare Other

## 2021-08-04 DIAGNOSIS — S3210XD Unspecified fracture of sacrum, subsequent encounter for fracture with routine healing: Secondary | ICD-10-CM

## 2021-08-04 DIAGNOSIS — S32592D Other specified fracture of left pubis, subsequent encounter for fracture with routine healing: Secondary | ICD-10-CM

## 2021-08-04 DIAGNOSIS — S72002A Fracture of unspecified part of neck of left femur, initial encounter for closed fracture: Secondary | ICD-10-CM

## 2021-08-04 DIAGNOSIS — S32591D Other specified fracture of right pubis, subsequent encounter for fracture with routine healing: Secondary | ICD-10-CM

## 2021-08-04 NOTE — Patient Instructions (Signed)
Follow up in six weeks w/ xray pelvis  ?

## 2021-08-04 NOTE — Progress Notes (Signed)
FOLLOW UP  ? ?Encounter Diagnoses  ?Name Primary?  ? Closed fracture of sacrum with routine healing, unspecified portion of sacrum, subsequent encounter   ? Closed bilateral fracture of pubic rami with routine healing, subsequent encounter Yes  ? ? ? ?Chief Complaint  ?Patient presents with  ? Hip Pain  ?  Fracture care ?DOI 06/20/21 ?  ? ? ? ?88 year old female was seen in the hospital as a consultation for multiple fractures of her sacrum.  I saw her on June 22, 2021 advised weightbearing as tolerated and physical therapy ? ?She is here for the follow-up visit 45 days later ? ?She is having more left sided pain than right but she can flex both hips and I move them and she did not have any pain ? ?X-rays show the fractures are healing appropriately ? ?Recommend full weightbearing as tolerated physical therapy as tolerated follow-up in 6 weeks repeat x-ray ?

## 2021-08-09 ENCOUNTER — Encounter: Payer: Self-pay | Admitting: Adult Health

## 2021-08-09 ENCOUNTER — Non-Acute Institutional Stay (SKILLED_NURSING_FACILITY): Payer: Medicare Other | Admitting: Adult Health

## 2021-08-09 DIAGNOSIS — R634 Abnormal weight loss: Secondary | ICD-10-CM | POA: Diagnosis not present

## 2021-08-09 DIAGNOSIS — S3282XS Multiple fractures of pelvis without disruption of pelvic ring, sequela: Secondary | ICD-10-CM

## 2021-08-09 NOTE — Progress Notes (Addendum)
?Location:  [facility] ?Nursing Home Room Number: 125-P ?Place of Service:  SNF (31) ? ? ?CODE STATUS: DNR ? ?No Known Allergies ? ?Chief Complaint  ?Patient presents with  ? Acute Visit  ?  Weight loss   ? ? ?HPI: ? ?She is losing weight. She has lost 15 pounds over the past 11 days. Her current weight is 113 pounds. She has had recent covid infection and has had multiple fractures. She is due to have her oxycodone stopped. Her po intake is poor. There are no reports of aspiration present.  ? ?Past Medical History:  ?Diagnosis Date  ? Arthritis   ? GERD (gastroesophageal reflux disease)   ? Hypertension   ? ? ?Past Surgical History:  ?Procedure Laterality Date  ? ABDOMINAL HYSTERECTOMY    ? [place]  ? BREAST SURGERY Left   ? benign  ? CATARACT EXTRACTION W/PHACO Right 08/03/2013  ? Procedure: CATARACT EXTRACTION PHACO AND INTRAOCULAR LENS PLACEMENT (IOC);  Surgeon: Tonny Branch, MD;  Location: [facility];  Service: Ophthalmology;  Laterality: Right;  CDE 13.72  ? CHOLECYSTECTOMY    ? ? ?Social History  ? ?Socioeconomic History  ? Marital status: Single  ?  Spouse name: Not on file  ? Number of children: Not on file  ? Years of education: Not on file  ? Highest education level: Not on file  ?Occupational History  ? Not on file  ?Tobacco Use  ? Smoking status: Never  ? Smokeless tobacco: Never  ?Vaping Use  ? Vaping Use: Never used  ?Substance and Sexual Activity  ? Alcohol use: No  ? Drug use: No  ? Sexual activity: Yes  ?  Birth control/protection: Surgical  ?Other Topics Concern  ? Not on file  ?Social History Narrative  ? ** Merged History Encounter **  ?    ? ?Social Determinants of Health  ? ?Financial Resource Strain: Not on file  ?Food Insecurity: Not on file  ?Transportation Needs: Not on file  ?Physical Activity: Not on file  ?Stress: Not on file  ?Social Connections: Not on file  ?Intimate Partner Violence: Not on file  ? ?History reviewed. No pertinent family history. ? ? ? ?VITAL SIGNS ?BP 132/72    Pulse 82   Temp 97.8 ?F (36.6 ?C)   Ht 5\' 3"  (1.6 m)   Wt 113 lb (51.3 kg)   BMI 20.02 kg/m?  ? ?Outpatient Encounter Medications as of 08/09/2021  ?Medication Sig  ? acetaminophen (TYLENOL) 650 MG CR tablet Take 650 mg by mouth every 6 (six) hours.  ? amiodarone (PACERONE) 200 MG tablet Take 1 tablet (200 mg total) by mouth daily.  ? amLODipine (NORVASC) 5 MG tablet Take 1 tablet (5 mg total) by mouth daily.  ? apixaban (ELIQUIS) 2.5 MG TABS tablet Take 2.5 mg by mouth 2 (two) times daily.  ? Lidocaine 4 % PTCH Apply 2 patches topically. to lower back daily for pain management.  ? Magnesium Hydroxide (MILK OF MAGNESIA PO) Take by mouth. 30 ml by mouth daily as needed for constipation.  ? metoprolol succinate (TOPROL-XL) 25 MG 24 hr tablet Take 0.5 tablets (12.5 mg total) by mouth daily.  ? NON FORMULARY Diet:Regular  ? omeprazole (PRILOSEC) 40 MG capsule Take 1 capsule (40 mg total) by mouth daily.  ? oxyCODONE (ROXICODONE) 5 MG immediate release tablet Take 0.5 tablets (2.5 mg total) by mouth every 8 (eight) hours as needed for severe pain.  ? potassium chloride (MICRO-K) 10 MEQ CR  capsule Take 1 capsule (10 mEq total) by mouth daily.  ? ?No facility-administered encounter medications on file as of 08/09/2021.  ? ? ? ?SIGNIFICANT DIAGNOSTIC EXAMS ? ? ?PREVIOUS  ?  ?06-21-21: right hip x-ray:  ?1. Acute nondisplaced fractures of the right inferior pubic ramus, left superior pubic ramus and left parasymphyseal pubic bone. ?2. Subacute or possibly acute fracture in the right sacrum, likely insufficiency fracture. ?3. Osteopenia and other findings as described. ?  ?06-21-21: ct of head and cervical spine:  ?No acute intracranial abnormality. Small right-sided scalp hematoma. Chronic small vessel ischemic disease. ?No acute cervical spine fracture. Multilevel degenerative disc disease and facet arthropathy ?  ?07-10-21: chest x-ray:  ?Opacification left hemidiaphragm with mild patchy density compatible with left basilar  pneumonia small left pleural effusion ?Mild cardiomegaly ?Mild degree osteopenia ?Mild osteoarthritis  ? ?07-17-21: chest x-ray: improvement in left base pneumonia  ? ?TODAY ? ?07-21-21: pelvic x-ray:  ?Degenerative changes of lumbar spine ?Bilateral fracture of superior/inferior pubic rami appear acute ?Hip unremarkable.  ? ?07-25-21: ct of pelvis:  ?1. Subacute comminuted right sacral insufficiency fracture with sclerosis along the fracture cleft. ?2. Acute nondisplaced mildly comminuted left sacral insufficiency fracture. ?3. Ununited left inferior pubic ramus fracture with mild surrounding callus formation. ?4. Severely comminuted left superior pubic ramus fracture involving the pubic body. ?5. Mildly comminuted right superior pubic ramus-acetabular junction fracture with mild surrounding callus formation. ?6. Ununited mildly comminuted right inferior pubic ramus fracture. ?7. Moderate osteoarthritis of the right hip with small marginal osteophytes. ?8. Given the patient's age and osteopenia, if there is persistent clinical concern for an occult hip fracture, a MRI of the hip is recommended for increased sensitivity. ? ?07-26-21: MRI hips ?1. Redemonstration of bilateral sacral alae, bilateral inferior pubic ramus, left superior pubic ramus and adjacent pubic body and right superior pubic ramus-acetabular junction acute to subacute fractures. ?2. 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 No acute fracture is seen within either proximal femur. ?3. Edema throughout the bilateral adductor groin musculature prominent likely secondary to muscle strains in the adjacent pelvic fractures. ? ? ?LABS REVIEWED: PREVIOUS  ?  ?06-21-21 wbc 9.1; hgb 11.5; hct 34.6; mcv 100.6 plt 268; glucose 124; bun 38; creat 1.93; k+ 4.1; na++ 139; ca 8.6; GFR 24 ?06-22-21: vitamin B12: 506; folate 24.1 ?06-23-21: wbc 8.8; hgb 10.1; hct 30.1; mcv 101.7 plt 234; glucose 108; bun 37; creat 2.04; k+ 4.7; na++ 138; ca 8.6; GFR  32; mag 2.1  ?  ?TODAY ? ?07-10-21; wbc 9.3; hgb 11.1; hct 32.4; mcv 98.8 plt 415; glucose 109; bun 24; creat 1.58; k+ 4.2; na++ 134; ca 8.6; GFR 31; d-dimer 4.34; CRP 8.1 ?07-18-21: CRP 20.8; d-dimer 5.87 sed rate: 40. ?07-24-21: crp  ?07-27-21: vitamin D 64.22 tsh 0.958 free t4: 1.57; pth 30 ca 8.3; ionized ca 4.7  ? ?Review of Systems  ?Constitutional:  Negative for malaise/fatigue.  ?Respiratory:  Negative for cough and shortness of breath.   ?Cardiovascular:  Negative for chest pain, palpitations and leg swelling.  ?Gastrointestinal:  Negative for abdominal pain, constipation and heartburn.  ?Musculoskeletal:  Negative for back pain, joint pain and myalgias.  ?Skin: Negative.   ?Neurological:  Negative for dizziness.  ?Psychiatric/Behavioral:  The patient is not nervous/anxious.   ? ?Physical Exam ?Constitutional:   ?   General: She is not in acute distress. ?   Appearance: She is well-developed. She is not diaphoretic.  ?Neck:  ?   Thyroid: No thyromegaly.  ?Cardiovascular:  ?  Rate and Rhythm: Normal rate and regular rhythm.  ?   Pulses: Normal pulses.  ?   Heart sounds: Normal heart sounds.  ?Pulmonary:  ?   Effort: Pulmonary effort is normal. No respiratory distress.  ?   Breath sounds: Normal breath sounds.  ?Abdominal:  ?   General: Bowel sounds are normal. There is no distension.  ?   Palpations: Abdomen is soft.  ?   Tenderness: There is no abdominal tenderness.  ?Musculoskeletal:     ?   General: Normal range of motion.  ?   Cervical back: Neck supple.  ?   Right lower leg: No edema.  ?   Left lower leg: No edema.  ?Lymphadenopathy:  ?   Cervical: No cervical adenopathy.  ?Skin: ?   General: Skin is warm and dry.  ?Neurological:  ?   Mental Status: She is alert. Mental status is at baseline.  ?Psychiatric:     ?   Mood and Affect: Mood normal.  ? ? ? ?ASSESSMENT/ PLAN: ? ?TODAY ? ?Weight loss ?Multiple closed fracture of pelvis  without disruption of pelvic ring sequela:  ? ?Will begin remeron 7.5 mg  nightly through 09-09-21.  ?Will stop oxycodone at this time.  ?Will monitor her status ?Will check d-dimer.  ?Will begin forteo for her osteoporosis with sacral and pelvic fractures.  ? ? ?Ok Edwards NP ?[facility]

## 2021-08-10 ENCOUNTER — Other Ambulatory Visit (HOSPITAL_COMMUNITY)
Admission: RE | Admit: 2021-08-10 | Discharge: 2021-08-10 | Disposition: A | Discharge status: Home / Self Care | Payer: Medicare Other | Source: Skilled Nursing Facility | Attending: Adult Health | Admitting: Adult Health

## 2021-08-10 DIAGNOSIS — U071 COVID-19: Secondary | ICD-10-CM | POA: Insufficient documentation

## 2021-08-10 LAB — D-DIMER, QUANTITATIVE: D-Dimer, Quant: 5.92 ug/mL-FEU — ABNORMAL HIGH (ref 0.00–0.50)

## 2021-08-11 ENCOUNTER — Encounter: Payer: Self-pay | Admitting: Adult Health

## 2021-08-11 ENCOUNTER — Non-Acute Institutional Stay (SKILLED_NURSING_FACILITY): Payer: Medicare Other | Admitting: Adult Health

## 2021-08-11 DIAGNOSIS — D6869 Other thrombophilia: Secondary | ICD-10-CM

## 2021-08-11 DIAGNOSIS — U071 COVID-19: Secondary | ICD-10-CM | POA: Diagnosis not present

## 2021-08-11 NOTE — Progress Notes (Signed)
?Location:  [place] [facility] Room Number: 125-P ?Place of Service:  SNF (31) ? ? ?CODE STATUS: DNR ? ?No Known Allergies ? ?Chief Complaint  ?Patient presents with  ? Acute Visit  ?  Lab follow-up  ? ? ?HPI: ? ?Her d-dimer remains elevated due to covid 19. There are no indications of abnormal clotting present. No cough; no shortness of breath; no lower extremity swelling present.  ? ?Past Medical History:  ?Diagnosis Date  ? Arthritis   ? GERD (gastroesophageal reflux disease)   ? Hypertension   ? ? ?Past Surgical History:  ?Procedure Laterality Date  ? ABDOMINAL HYSTERECTOMY    ? [place]  ? BREAST SURGERY Left   ? benign  ? CATARACT EXTRACTION W/PHACO Right 08/03/2013  ? Procedure: CATARACT EXTRACTION PHACO AND INTRAOCULAR LENS PLACEMENT ([facility]);  Surgeon: Tonny Branch, MD;  Location: [facility];  Service: Ophthalmology;  Laterality: Right;  CDE 13.72  ? CHOLECYSTECTOMY    ? ? ?Social History  ? ?Socioeconomic History  ? Marital status: Single  ?  Spouse name: Not on file  ? Number of children: Not on file  ? Years of education: Not on file  ? Highest education level: Not on file  ?Occupational History  ? Not on file  ?Tobacco Use  ? Smoking status: Never  ? Smokeless tobacco: Never  ?Vaping Use  ? Vaping Use: Never used  ?Substance and Sexual Activity  ? Alcohol use: No  ? Drug use: No  ? Sexual activity: Yes  ?  Birth control/protection: Surgical  ?Other Topics Concern  ? Not on file  ?Social History Narrative  ? ** Merged History Encounter **  ?    ? ?Social Determinants of Health  ? ?Financial Resource Strain: Not on file  ?Food Insecurity: Not on file  ?Transportation Needs: Not on file  ?Physical Activity: Not on file  ?Stress: Not on file  ?Social Connections: Not on file  ?Intimate Partner Violence: Not on file  ? ?History reviewed. No pertinent family history. ? ? ? ?VITAL SIGNS ?BP 132/74   Pulse 82   Temp (!) 97.1 ?F (36.2 ?C)   Resp 18   Ht 5\' 3"  (1.6 m)   Wt 116 lb (52.6 kg)   SpO2 94%    BMI 20.55 kg/m?  ? ?Outpatient Encounter Medications as of 08/11/2021  ?Medication Sig  ? acetaminophen (TYLENOL) 650 MG CR tablet Take 650 mg by mouth every 6 (six) hours.  ? amiodarone (PACERONE) 200 MG tablet Take 1 tablet (200 mg total) by mouth daily.  ? amLODipine (NORVASC) 5 MG tablet Take 1 tablet (5 mg total) by mouth daily.  ? apixaban (ELIQUIS) 2.5 MG TABS tablet Take 2.5 mg by mouth 2 (two) times daily.  ? Lidocaine 4 % PTCH Apply 2 patches topically. to lower back daily for pain management.  ? Magnesium Hydroxide (MILK OF MAGNESIA PO) Take by mouth. 30 ml by mouth daily as needed for constipation.  ? metoprolol succinate (TOPROL-XL) 25 MG 24 hr tablet Take 0.5 tablets (12.5 mg total) by mouth daily.  ? mirtazapine (REMERON) 7.5 MG tablet Take 7.5 mg by mouth at bedtime.  ? NON FORMULARY Diet:Regular  ? omeprazole (PRILOSEC) 40 MG capsule Take 1 capsule (40 mg total) by mouth daily.  ? potassium chloride (MICRO-K) 10 MEQ CR capsule Take 1 capsule (10 mEq total) by mouth daily.  ? Teriparatide, Recombinant, (FORTEO) 600 MCG/2.4ML SOPN Inject into the skin. 20 mcg Once A Day ?for  osteoporosis with sacral and pelvic fractures  ? [DISCONTINUED] oxyCODONE (ROXICODONE) 5 MG immediate release tablet Take 0.5 tablets (2.5 mg total) by mouth every 8 (eight) hours as needed for severe pain.  ? ?No facility-administered encounter medications on file as of 08/11/2021.  ? ? ? ?SIGNIFICANT DIAGNOSTIC EXAMS ? ?PREVIOUS  ?  ?06-21-21: right hip x-ray:  ?1. Acute nondisplaced fractures of the right inferior pubic ramus, left superior pubic ramus and left parasymphyseal pubic bone. ?2. Subacute or possibly acute fracture in the right sacrum, likely insufficiency fracture. ?3. Osteopenia and other findings as described. ?  ?06-21-21: ct of head and cervical spine:  ?No acute intracranial abnormality. Small right-sided scalp hematoma. Chronic small vessel ischemic disease. ?No acute cervical spine fracture. Multilevel  degenerative disc disease and facet arthropathy ?  ?07-10-21: chest x-ray:  ?Opacification left hemidiaphragm with mild patchy density compatible with left basilar pneumonia small left pleural effusion ?Mild cardiomegaly ?Mild degree osteopenia ?Mild osteoarthritis  ? ?07-17-21: chest x-ray: improvement in left base pneumonia  ? ?07-21-21: pelvic x-ray:  ?Degenerative changes of lumbar spine ?Bilateral fracture of superior/inferior pubic rami appear acute ?Hip unremarkable.  ? ?07-25-21: ct of pelvis:  ?1. Subacute comminuted right sacral insufficiency fracture with sclerosis along the fracture cleft. ?2. Acute nondisplaced mildly comminuted left sacral insufficiency fracture. ?3. Ununited left inferior pubic ramus fracture with mild surrounding callus formation. ?4. Severely comminuted left superior pubic ramus fracture involving the pubic body. ?5. Mildly comminuted right superior pubic ramus-acetabular junction fracture with mild surrounding callus formation. ?6. Ununited mildly comminuted right inferior pubic ramus fracture. ?7. Moderate osteoarthritis of the right hip with small marginal osteophytes. ?8. Given the patient's age and osteopenia, if there is persistent clinical concern for an occult hip fracture, a MRI of the hip is recommended for increased sensitivity. ? ?07-26-21: MRI hips ?1. Redemonstration of bilateral sacral alae, bilateral inferior pubic ramus, left superior pubic ramus and adjacent pubic body and right superior pubic ramus-acetabular junction acute to subacute fractures. ?2. 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 No acute fracture is seen within either proximal femur. ?3. Edema throughout the bilateral adductor groin musculature prominent likely secondary to muscle strains in the adjacent pelvic fractures. ? ?NO NEW EXAMS.  ? ? ?LABS REVIEWED: PREVIOUS  ?  ?06-21-21 wbc 9.1; hgb 11.5; hct 34.6; mcv 100.6 plt 268; glucose 124; bun 38; creat 1.93; k+ 4.1; na++ 139; ca  8.6; GFR 24 ?06-22-21: vitamin B12: 506; folate 24.1 ?06-23-21: wbc 8.8; hgb 10.1; hct 30.1; mcv 101.7 plt 234; glucose 108; bun 37; creat 2.04; k+ 4.7; na++ 138; ca 8.6; GFR 32; mag 2.1  ?  ?TODAY ? ?07-10-21; wbc 9.3; hgb 11.1; hct 32.4; mcv 98.8 plt 415; glucose 109; bun 24; creat 1.58; k+ 4.2; na++ 134; ca 8.6; GFR 31; d-dimer 4.34; CRP 8.1 ?07-18-21: CRP 20.8; d-dimer 5.87 sed rate: 40. ?07-24-21: crp  ?07-27-21: vitamin D 64.22 tsh 0.958 free t4: 1.57; pth 30 ca 8.3; ionized ca 4.7  ?08-10-21: d-dimer 5.92  ? ?Review of Systems  ?Constitutional:  Negative for malaise/fatigue.  ?Respiratory:  Negative for cough and shortness of breath.   ?Cardiovascular:  Negative for chest pain, palpitations and leg swelling.  ?Gastrointestinal:  Negative for abdominal pain, constipation and heartburn.  ?Musculoskeletal:  Positive for back pain. Negative for joint pain and myalgias.  ?Skin: Negative.   ?Neurological:  Negative for dizziness.  ?Psychiatric/Behavioral:  The patient is not nervous/anxious.   ? ? ?Physical Exam ?Constitutional:   ?   General:  She is not in acute distress. ?   Appearance: She is well-developed. She is not diaphoretic.  ?Neck:  ?   Thyroid: No thyromegaly.  ?Cardiovascular:  ?   Rate and Rhythm: Normal rate and regular rhythm.  ?   Pulses: Normal pulses.  ?   Heart sounds: Normal heart sounds.  ?Pulmonary:  ?   Effort: Pulmonary effort is normal. No respiratory distress.  ?   Breath sounds: Normal breath sounds.  ?Abdominal:  ?   General: Bowel sounds are normal. There is no distension.  ?   Palpations: Abdomen is soft.  ?   Tenderness: There is no abdominal tenderness.  ?Musculoskeletal:     ?   General: Normal range of motion.  ?   Cervical back: Neck supple.  ?   Right lower leg: No edema.  ?   Left lower leg: No edema.  ?Lymphadenopathy:  ?   Cervical: No cervical adenopathy.  ?Skin: ?   General: Skin is warm and dry.  ?Neurological:  ?   Mental Status: She is alert. Mental status is at baseline.   ?Psychiatric:     ?   Mood and Affect: Mood normal.  ? ? ? ? ?ASSESSMENT/ PLAN: ? ?TODAY ? ?Hypercoagulable state due to covid 19: will continue eliquis; will check d-dimer 08-24-21. Will monitor  ? ? ?Ok Edwards NP ?[employer]

## 2021-08-14 ENCOUNTER — Telehealth: Payer: Self-pay | Admitting: Orthopedic Surgery

## 2021-08-14 NOTE — Telephone Encounter (Signed)
It is however patient's designated contact and power of attorney on file, Paula Mason, is asking if Dr Aline Brochure can add a note or a letter to address the billing/insurance issue. Please advise. ?

## 2021-08-14 NOTE — Telephone Encounter (Signed)
Information as per note received in fax today, 08/14/21. Services provided are in regard to place of service at skilled care facility, [facility]* placed in Dr's box* ? *Dr Aline Brochure: Please review and advise regarding attached documentation "Detailed Explanation of Non-coverage" - due by NOON (tomorrow) 08/14/21 ?

## 2021-08-14 NOTE — Telephone Encounter (Signed)
Patient's designated contact Isidore Moos, nephew and power of attorney; states patient is at [facility], and had been seen by Dr Aline Brochure in the office on 4.28/23. States he has an explanation of benefits indicating that her insurance is denying all services. States he would like Dr Aline Brochure to address it.  Michela Pitcher he is on way to [place] from  [place], [place], and will bring in the information for review and advice. ?- also states deadline per [employer] ine to receive this information is tomorrow 08/15/21 at 12:00 noon ?

## 2021-08-18 NOTE — Telephone Encounter (Signed)
Done

## 2021-08-18 NOTE — Telephone Encounter (Signed)
Relayed to patient's designated contact, Isidore Moos, ph# [phone]. Aware and voiced understanding per Dr Ruthe Mannan written note; he requested a copy. Faxed to him at Fax# [phone]. ?

## 2021-08-24 ENCOUNTER — Non-Acute Institutional Stay (SKILLED_NURSING_FACILITY): Payer: Medicare Other | Admitting: Adult Health

## 2021-08-24 ENCOUNTER — Other Ambulatory Visit (HOSPITAL_COMMUNITY)
Admission: RE | Admit: 2021-08-24 | Discharge: 2021-08-24 | Disposition: A | Discharge status: Home / Self Care | Payer: Medicare Other | Source: Skilled Nursing Facility | Attending: Adult Health | Admitting: Adult Health

## 2021-08-24 ENCOUNTER — Encounter: Payer: Self-pay | Admitting: Adult Health

## 2021-08-24 DIAGNOSIS — K219 Gastro-esophageal reflux disease without esophagitis: Secondary | ICD-10-CM | POA: Diagnosis not present

## 2021-08-24 DIAGNOSIS — N184 Chronic kidney disease, stage 4 (severe): Secondary | ICD-10-CM

## 2021-08-24 DIAGNOSIS — I10 Essential (primary) hypertension: Secondary | ICD-10-CM

## 2021-08-24 DIAGNOSIS — U071 COVID-19: Secondary | ICD-10-CM | POA: Insufficient documentation

## 2021-08-24 LAB — D-DIMER, QUANTITATIVE: D-Dimer, Quant: 4.45 ug/mL-FEU — ABNORMAL HIGH (ref 0.00–0.50)

## 2021-08-24 NOTE — Progress Notes (Signed)
Location:  [place] Room Number: 125-P Place of Service:  SNF (31)   CODE STATUS: DNR  No Known Allergies  Chief Complaint  Patient presents with   Medical Management of Chronic Issues                      Essential hypertension: Gastroesophageal reflux disease without esophagitis: Stage 4 chronic kidney disease:    HPI:  She is a  88 year old resident of this facility being seen for the management of her chronic illnesses:Essential hypertension: Gastroesophageal reflux disease without esophagitis: Stage 4 chronic kidney disease:. There are no reports of uncontrolled pain; no changes in appetite; weight is stable   Past Medical History:  Diagnosis Date   Arthritis    GERD (gastroesophageal reflux disease)    Hypertension     Past Surgical History:  Procedure Laterality Date   ABDOMINAL HYSTERECTOMY     [place]   BREAST SURGERY Left    benign   CATARACT EXTRACTION W/PHACO Right 08/03/2013   Procedure: CATARACT EXTRACTION PHACO AND INTRAOCULAR LENS PLACEMENT ([place]);  Surgeon: Tonny Branch, MD;  Location: [facility];  Service: Ophthalmology;  Laterality: Right;  CDE 13.72   CHOLECYSTECTOMY      Social History   Socioeconomic History   Marital status: Single    Spouse name: Not on file   Number of children: Not on file   Years of education: Not on file   Highest education level: Not on file  Occupational History   Not on file  Tobacco Use   Smoking status: Never   Smokeless tobacco: Never  Vaping Use   Vaping Use: Never used  Substance and Sexual Activity   Alcohol use: No   Drug use: No   Sexual activity: Yes    Birth control/protection: Surgical  Other Topics Concern   Not on file  Social History Narrative   ** Merged History Encounter **       Social Determinants of Health   Financial Resource Strain: Not on file  Food Insecurity: Not on file  Transportation Needs: Not on file  Physical Activity: Not on file  Stress: Not on file   Social Connections: Not on file  Intimate Partner Violence: Not on file   History reviewed. No pertinent family history.    VITAL SIGNS BP (!) 106/58   Pulse 69   Temp 97.6 F (36.4 C)   Resp 17   Ht 5\' 3"  (1.6 m)   Wt 116 lb (52.6 kg)   SpO2 95%   BMI 20.55 kg/m   Outpatient Encounter Medications as of 08/24/2021  Medication Sig   acetaminophen (TYLENOL) 650 MG CR tablet Take 650 mg by mouth every 6 (six) hours.   amiodarone (PACERONE) 200 MG tablet Take 1 tablet (200 mg total) by mouth daily.   amLODipine (NORVASC) 5 MG tablet Take 1 tablet (5 mg total) by mouth daily.   apixaban (ELIQUIS) 2.5 MG TABS tablet Take 2.5 mg by mouth 2 (two) times daily.   Lidocaine 4 % PTCH Apply 2 patches topically. to lower back daily for pain management.   Magnesium Hydroxide (MILK OF MAGNESIA PO) Take by mouth. 30 ml by mouth daily as needed for constipation.   metoprolol succinate (TOPROL-XL) 25 MG 24 hr tablet Take 0.5 tablets (12.5 mg total) by mouth daily.   mirtazapine (REMERON) 7.5 MG tablet Take 7.5 mg by mouth at bedtime.   NON FORMULARY Diet:Regular   omeprazole (  PRILOSEC) 40 MG capsule Take 1 capsule (40 mg total) by mouth daily.   potassium chloride (MICRO-K) 10 MEQ CR capsule Take 1 capsule (10 mEq total) by mouth daily.   Teriparatide, Recombinant, (FORTEO) 600 MCG/2.4ML SOPN Inject into the skin. 20 mcg Once A Day for osteoporosis with sacral and pelvic fractures   No facility-administered encounter medications on file as of 08/24/2021.     SIGNIFICANT DIAGNOSTIC EXAMS   PREVIOUS    06-21-21: right hip x-ray:  1. Acute nondisplaced fractures of the right inferior pubic ramus, left superior pubic ramus and left parasymphyseal pubic bone. 2. Subacute or possibly acute fracture in the right sacrum, likely insufficiency fracture. 3. Osteopenia and other findings as described.   06-21-21: ct of head and cervical spine:  No acute intracranial abnormality. Small right-sided  scalp hematoma. Chronic small vessel ischemic disease. No acute cervical spine fracture. Multilevel degenerative disc disease and facet arthropathy   07-10-21: chest x-ray:  Opacification left hemidiaphragm with mild patchy density compatible with left basilar pneumonia small left pleural effusion Mild cardiomegaly Mild degree osteopenia Mild osteoarthritis   07-17-21: chest x-ray: improvement in left base pneumonia   07-21-21: pelvic x-ray:  Degenerative changes of lumbar spine Bilateral fracture of superior/inferior pubic rami appear acute Hip unremarkable.   07-25-21: ct of pelvis:  1. Subacute comminuted right sacral insufficiency fracture with sclerosis along the fracture cleft. 2. Acute nondisplaced mildly comminuted left sacral insufficiency fracture. 3. Ununited left inferior pubic ramus fracture with mild surrounding callus formation. 4. Severely comminuted left superior pubic ramus fracture involving the pubic body. 5. Mildly comminuted right superior pubic ramus-acetabular junction fracture with mild surrounding callus formation. 6. Ununited mildly comminuted right inferior pubic ramus fracture. 7. Moderate osteoarthritis of the right hip with small marginal osteophytes. 8. Given the patient's age and osteopenia, if there is persistent clinical concern for an occult hip fracture, a MRI of the hip is recommended for increased sensitivity.  07-26-21: MRI hips 1. Redemonstration of bilateral sacral alae, bilateral inferior pubic ramus, left superior pubic ramus and adjacent pubic body and right superior pubic ramus-acetabular junction acute to subacute fractures. 2. 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 No acute fracture is seen within either proximal femur. 3. Edema throughout the bilateral adductor groin musculature prominent likely secondary to muscle strains in the adjacent pelvic fractures.  NO NEW EXAMS.    LABS REVIEWED: PREVIOUS    06-21-21 wbc  9.1; hgb 11.5; hct 34.6; mcv 100.6 plt 268; glucose 124; bun 38; creat 1.93; k+ 4.1; na++ 139; ca 8.6; GFR 24 06-22-21: vitamin B12: 506; folate 24.1 06-23-21: wbc 8.8; hgb 10.1; hct 30.1; mcv 101.7 plt 234; glucose 108; bun 37; creat 2.04; k+ 4.7; na++ 138; ca 8.6; GFR 32; mag 2.1   07-10-21; wbc 9.3; hgb 11.1; hct 32.4; mcv 98.8 plt 415; glucose 109; bun 24; creat 1.58; k+ 4.2; na++ 134; ca 8.6; GFR 31; d-dimer 4.34; CRP 8.1 07-18-21: CRP 20.8; d-dimer 5.87 sed rate: 40. 07-24-21: crp  07-27-21: vitamin D 64.22 tsh 0.958 free t4: 1.57; pth 30 ca 8.3; ionized ca 4.7  08-10-21: d-dimer 5.92   NO NEW LABS.   Review of Systems  Constitutional:  Negative for malaise/fatigue.  Respiratory:  Negative for cough and shortness of breath.   Cardiovascular:  Negative for chest pain, palpitations and leg swelling.  Gastrointestinal:  Negative for abdominal pain, constipation and heartburn.  Musculoskeletal:  Negative for back pain, joint pain and myalgias.  Skin: Negative.   Neurological:  Negative  for dizziness.  Psychiatric/Behavioral:  The patient is not nervous/anxious.    Physical Exam Constitutional:      General: She is not in acute distress.    Appearance: She is well-developed. She is not diaphoretic.  Neck:     Thyroid: No thyromegaly.  Cardiovascular:     Rate and Rhythm: Normal rate and regular rhythm.     Pulses: Normal pulses.     Heart sounds: Normal heart sounds.  Pulmonary:     Effort: Pulmonary effort is normal. No respiratory distress.     Breath sounds: Normal breath sounds.  Abdominal:     General: Bowel sounds are normal. There is no distension.     Palpations: Abdomen is soft.     Tenderness: There is no abdominal tenderness.  Musculoskeletal:        General: Normal range of motion.     Cervical back: Neck supple.     Right lower leg: No edema.     Left lower leg: No edema.  Lymphadenopathy:     Cervical: No cervical adenopathy.  Skin:    General: Skin is warm and  dry.  Neurological:     Mental Status: She is alert. Mental status is at baseline.  Psychiatric:        Mood and Affect: Mood normal.     ASSESSMENT/ PLAN:  TODAY  Essential hypertension: b/p 106/58 will continue norvasc 5 mg daily; toprol xl 12.5 mg daily  2. Gastroesophageal reflux disease without esophagitis: will continue prilosec 40 mg daily   3. Stage 4 chronic kidney disease: bun 37; creat 2.04; gfr 23   PREVIOUS   4. 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 Weight loss: weight is 116 pounds; will continue remeron for a total of 30 days.    6. Hypercoagulable state associated with covid 19: her d-dimer is 5.92; after reading up to date; will stop her eliquis at this time.   7. Closed nondisplaced unspecified part of pelvis sequela: will continue therapy as able. Will follow up with orthopedics. Will continue tylenol 650 mg every 6 hours  8.  Atrial fibrillation, chronic: will continue amiodarone 200 mg daily and toprol xl 12.5 mg daily for rate control.   Ok Edwards NP [facility]   call [phone]

## 2021-08-28 ENCOUNTER — Encounter: Payer: Self-pay | Admitting: Adult Health

## 2021-09-05 ENCOUNTER — Encounter: Payer: Self-pay | Admitting: Adult Health

## 2021-09-05 ENCOUNTER — Non-Acute Institutional Stay (SKILLED_NURSING_FACILITY): Payer: Medicare Other | Admitting: Adult Health

## 2021-09-05 DIAGNOSIS — R3981 Functional urinary incontinence: Secondary | ICD-10-CM | POA: Diagnosis not present

## 2021-09-05 NOTE — Progress Notes (Unsigned)
Location:  [place] Room Number: 628Z Place of Service:  SNF [phone]) Provider:  Ok Edwards, NP  CODE STATUS: DNR  No Known Allergies  Chief Complaint  Patient presents with   Acute Visit   Dementia    Urinary incontinence    HPI:    Past Medical History:  Diagnosis Date   Arthritis    Essential hypertension 06/21/2021   GERD (gastroesophageal reflux disease)    Hypertension     Past Surgical History:  Procedure Laterality Date   ABDOMINAL HYSTERECTOMY     [place]   BREAST SURGERY Left    benign   CATARACT EXTRACTION W/PHACO Right 08/03/2013   Procedure: CATARACT EXTRACTION PHACO AND INTRAOCULAR LENS PLACEMENT ([place]);  Surgeon: Tonny Branch, MD;  Location: [facility];  Service: Ophthalmology;  Laterality: Right;  CDE 13.72   CHOLECYSTECTOMY      Social History   Socioeconomic History   Marital status: Single    Spouse name: Not on file   Number of children: Not on file   Years of education: Not on file   Highest education level: Not on file  Occupational History   Not on file  Tobacco Use   Smoking status: Never   Smokeless tobacco: Never  Vaping Use   Vaping Use: Never used  Substance and Sexual Activity   Alcohol use: No   Drug use: No   Sexual activity: Yes    Birth control/protection: Surgical  Other Topics Concern   Not on file  Social History Narrative   ** Merged History Encounter **       Social Determinants of Health   Financial Resource Strain: Not on file  Food Insecurity: Not on file  Transportation Needs: Not on file  Physical Activity: Not on file  Stress: Not on file  Social Connections: Not on file  Intimate Partner Violence: Not on file   History reviewed. No pertinent family history.    VITAL SIGNS Temp 98 F (36.7 C)   Ht 5\' 3"  (1.6 m)   Wt 116 lb (52.6 kg)   BMI 20.55 kg/m   Outpatient Encounter Medications as of 09/05/2021  Medication Sig   acetaminophen (TYLENOL) 650 MG CR tablet Take 650  mg by mouth every 6 (six) hours.   amiodarone (PACERONE) 200 MG tablet Take 1 tablet (200 mg total) by mouth daily.   amLODipine (NORVASC) 5 MG tablet Take 1 tablet (5 mg total) by mouth daily.   darifenacin (ENABLEX) 7.5 MG 24 hr tablet Take 7.5 mg by mouth daily.   Lidocaine 4 % PTCH Apply 2 patches topically. to lower back daily for pain management.   Magnesium Hydroxide (MILK OF MAGNESIA PO) Take by mouth. 30 ml by mouth daily as needed for constipation.   metoprolol succinate (TOPROL-XL) 25 MG 24 hr tablet Take 0.5 tablets (12.5 mg total) by mouth daily.   mirtazapine (REMERON) 7.5 MG tablet Take 7.5 mg by mouth at bedtime.   NON FORMULARY Diet:Regular   omeprazole (PRILOSEC) 40 MG capsule Take 1 capsule (40 mg total) by mouth daily.   potassium chloride (MICRO-K) 10 MEQ CR capsule Take 1 capsule (10 mEq total) by mouth daily.   Teriparatide, Recombinant, (FORTEO) 600 MCG/2.4ML SOPN Inject into the skin. 20 mcg Once A Day for osteoporosis with sacral and pelvic fractures   [DISCONTINUED] apixaban (ELIQUIS) 2.5 MG TABS tablet Take 2.5 mg by mouth 2 (two) times daily.   No facility-administered encounter medications on file as of  09/05/2021.     SIGNIFICANT DIAGNOSTIC EXAMS       ASSESSMENT/ PLAN:     Ok Edwards NP [facility]  Contact [phone] Monday through Friday 8am- 5pm  After hours call [phone]

## 2021-09-08 DIAGNOSIS — R32 Unspecified urinary incontinence: Secondary | ICD-10-CM | POA: Insufficient documentation

## 2021-09-10 DIAGNOSIS — M6281 Muscle weakness (generalized): Secondary | ICD-10-CM | POA: Diagnosis not present

## 2021-09-10 DIAGNOSIS — S32501D Unspecified fracture of right pubis, subsequent encounter for fracture with routine healing: Secondary | ICD-10-CM | POA: Diagnosis not present

## 2021-09-10 DIAGNOSIS — Z741 Need for assistance with personal care: Secondary | ICD-10-CM | POA: Diagnosis not present

## 2021-09-10 DIAGNOSIS — R262 Difficulty in walking, not elsewhere classified: Secondary | ICD-10-CM | POA: Diagnosis not present

## 2021-09-10 DIAGNOSIS — R2681 Unsteadiness on feet: Secondary | ICD-10-CM | POA: Diagnosis not present

## 2021-09-10 DIAGNOSIS — S32110D Nondisplaced Zone I fracture of sacrum, subsequent encounter for fracture with routine healing: Secondary | ICD-10-CM | POA: Diagnosis not present

## 2021-09-10 DIAGNOSIS — S32592D Other specified fracture of left pubis, subsequent encounter for fracture with routine healing: Secondary | ICD-10-CM | POA: Diagnosis not present

## 2021-09-10 DIAGNOSIS — R279 Unspecified lack of coordination: Secondary | ICD-10-CM | POA: Diagnosis not present

## 2021-09-11 DIAGNOSIS — R262 Difficulty in walking, not elsewhere classified: Secondary | ICD-10-CM | POA: Diagnosis not present

## 2021-09-11 DIAGNOSIS — M6281 Muscle weakness (generalized): Secondary | ICD-10-CM | POA: Diagnosis not present

## 2021-09-11 DIAGNOSIS — S32592D Other specified fracture of left pubis, subsequent encounter for fracture with routine healing: Secondary | ICD-10-CM | POA: Diagnosis not present

## 2021-09-11 DIAGNOSIS — Z741 Need for assistance with personal care: Secondary | ICD-10-CM | POA: Diagnosis not present

## 2021-09-11 DIAGNOSIS — R2681 Unsteadiness on feet: Secondary | ICD-10-CM | POA: Diagnosis not present

## 2021-09-11 DIAGNOSIS — R279 Unspecified lack of coordination: Secondary | ICD-10-CM | POA: Diagnosis not present

## 2021-09-11 DIAGNOSIS — S32501D Unspecified fracture of right pubis, subsequent encounter for fracture with routine healing: Secondary | ICD-10-CM | POA: Diagnosis not present

## 2021-09-11 DIAGNOSIS — S32110D Nondisplaced Zone I fracture of sacrum, subsequent encounter for fracture with routine healing: Secondary | ICD-10-CM | POA: Diagnosis not present

## 2021-09-12 DIAGNOSIS — M6281 Muscle weakness (generalized): Secondary | ICD-10-CM | POA: Diagnosis not present

## 2021-09-12 DIAGNOSIS — S32592D Other specified fracture of left pubis, subsequent encounter for fracture with routine healing: Secondary | ICD-10-CM | POA: Diagnosis not present

## 2021-09-12 DIAGNOSIS — S32501D Unspecified fracture of right pubis, subsequent encounter for fracture with routine healing: Secondary | ICD-10-CM | POA: Diagnosis not present

## 2021-09-12 DIAGNOSIS — Z741 Need for assistance with personal care: Secondary | ICD-10-CM | POA: Diagnosis not present

## 2021-09-12 DIAGNOSIS — S32110D Nondisplaced Zone I fracture of sacrum, subsequent encounter for fracture with routine healing: Secondary | ICD-10-CM | POA: Diagnosis not present

## 2021-09-12 DIAGNOSIS — R262 Difficulty in walking, not elsewhere classified: Secondary | ICD-10-CM | POA: Diagnosis not present

## 2021-09-12 DIAGNOSIS — R279 Unspecified lack of coordination: Secondary | ICD-10-CM | POA: Diagnosis not present

## 2021-09-12 DIAGNOSIS — R2681 Unsteadiness on feet: Secondary | ICD-10-CM | POA: Diagnosis not present

## 2021-09-13 ENCOUNTER — Non-Acute Institutional Stay (SKILLED_NURSING_FACILITY): Payer: Medicare Other | Admitting: Adult Health

## 2021-09-13 DIAGNOSIS — I482 Chronic atrial fibrillation, unspecified: Secondary | ICD-10-CM | POA: Diagnosis not present

## 2021-09-13 DIAGNOSIS — S3210XS Unspecified fracture of sacrum, sequela: Secondary | ICD-10-CM

## 2021-09-13 DIAGNOSIS — R2681 Unsteadiness on feet: Secondary | ICD-10-CM | POA: Diagnosis not present

## 2021-09-13 DIAGNOSIS — M6281 Muscle weakness (generalized): Secondary | ICD-10-CM | POA: Diagnosis not present

## 2021-09-13 DIAGNOSIS — S3282XS Multiple fractures of pelvis without disruption of pelvic ring, sequela: Secondary | ICD-10-CM

## 2021-09-13 DIAGNOSIS — S32501D Unspecified fracture of right pubis, subsequent encounter for fracture with routine healing: Secondary | ICD-10-CM | POA: Diagnosis not present

## 2021-09-13 DIAGNOSIS — S32592D Other specified fracture of left pubis, subsequent encounter for fracture with routine healing: Secondary | ICD-10-CM | POA: Diagnosis not present

## 2021-09-13 DIAGNOSIS — R262 Difficulty in walking, not elsewhere classified: Secondary | ICD-10-CM | POA: Diagnosis not present

## 2021-09-13 DIAGNOSIS — Z741 Need for assistance with personal care: Secondary | ICD-10-CM | POA: Diagnosis not present

## 2021-09-13 DIAGNOSIS — R279 Unspecified lack of coordination: Secondary | ICD-10-CM | POA: Diagnosis not present

## 2021-09-13 DIAGNOSIS — S32110D Nondisplaced Zone I fracture of sacrum, subsequent encounter for fracture with routine healing: Secondary | ICD-10-CM | POA: Diagnosis not present

## 2021-09-14 ENCOUNTER — Encounter: Payer: Self-pay | Admitting: Adult Health

## 2021-09-14 DIAGNOSIS — S32592D Other specified fracture of left pubis, subsequent encounter for fracture with routine healing: Secondary | ICD-10-CM | POA: Diagnosis not present

## 2021-09-14 DIAGNOSIS — R2681 Unsteadiness on feet: Secondary | ICD-10-CM | POA: Diagnosis not present

## 2021-09-14 DIAGNOSIS — S32110D Nondisplaced Zone I fracture of sacrum, subsequent encounter for fracture with routine healing: Secondary | ICD-10-CM | POA: Diagnosis not present

## 2021-09-14 DIAGNOSIS — R262 Difficulty in walking, not elsewhere classified: Secondary | ICD-10-CM | POA: Diagnosis not present

## 2021-09-14 DIAGNOSIS — S32501D Unspecified fracture of right pubis, subsequent encounter for fracture with routine healing: Secondary | ICD-10-CM | POA: Diagnosis not present

## 2021-09-14 DIAGNOSIS — M6281 Muscle weakness (generalized): Secondary | ICD-10-CM | POA: Diagnosis not present

## 2021-09-14 DIAGNOSIS — R279 Unspecified lack of coordination: Secondary | ICD-10-CM | POA: Diagnosis not present

## 2021-09-14 DIAGNOSIS — Z741 Need for assistance with personal care: Secondary | ICD-10-CM | POA: Diagnosis not present

## 2021-09-14 NOTE — Progress Notes (Addendum)
Location:  [facility] Room Number: 125 Place of Service:  SNF (31)  Provider: Ok Edwards np   PCP: Monico Blitz, MD Patient Care Team: Monico Blitz, MD as PCP - General (Internal Medicine)  Extended Emergency Contact Information Primary Emergency Contact: Harris,June Address: [address]          [place] [address] [place] of [place] Phone: [phone] Relation: Friend Secondary Emergency Contact: [place] Mobile Phone: [phone] Relation: Nephew  Code Status: dnr  Goals of care:  Advanced Directive information    09/05/2021    4:05 PM  Advanced Directives  Does Patient Have a Medical Advance Directive? Yes  Type of Advance Directive Out of facility DNR (pink MOST or yellow form)  Does patient want to make changes to medical advance directive? No - Patient declined  Pre-existing out of facility DNR order (yellow form or pink MOST form) Yellow form placed in chart (order not valid for inpatient use)     No Known Allergies  Chief Complaint  Patient presents with   Discharge Note    HPI:   88 y.o. female  being discharged to assisted living. She will need home health for pt/ot. Her medications will be provided by the facility. She will need to follow up with her medical provider. She had been hospitalized for a pelvic fracture. She was admitted to this facility for short term rehab, she did contact covid; and treated without complications. She did have another pelvic fracture without another fall. She was started on forteo for her osteoporosis. Her ability with her adls; is at the assisted level of living. She is now ready for discharge.     Past Medical History:  Diagnosis Date   Arthritis    Essential hypertension 06/21/2021   GERD (gastroesophageal reflux disease)    Hypertension     Past Surgical History:  Procedure Laterality Date   ABDOMINAL HYSTERECTOMY     [place]   BREAST SURGERY Left    benign   CATARACT  EXTRACTION W/PHACO Right 08/03/2013   Procedure: CATARACT EXTRACTION PHACO AND INTRAOCULAR LENS PLACEMENT ([place]);  Surgeon: Tonny Branch, MD;  Location: [facility];  Service: Ophthalmology;  Laterality: Right;  CDE 13.72   CHOLECYSTECTOMY        reports that she has never smoked. She has never used smokeless tobacco. She reports that she does not drink alcohol and does not use drugs. Social History   Socioeconomic History   Marital status: Single    Spouse name: Not on file   Number of children: Not on file   Years of education: Not on file   Highest education level: Not on file  Occupational History   Not on file  Tobacco Use   Smoking status: Never   Smokeless tobacco: Never  Vaping Use   Vaping Use: Never used  Substance and Sexual Activity   Alcohol use: No   Drug use: No   Sexual activity: Yes    Birth control/protection: Surgical  Other Topics Concern   Not on file  Social History Narrative   ** Merged History Encounter **       Social Determinants of Health   Financial Resource Strain: Not on file  Food Insecurity: Not on file  Transportation Needs: Not on file  Physical Activity: Not on file  Stress: Not on file  Social Connections: Not on file  Intimate Partner Violence: Not on file   Functional Status Survey:    No Known Allergies  Pertinent  Health Maintenance Due  Topic Date Due   DEXA SCAN  Never done   INFLUENZA VACCINE  11/07/2021    Medications: Outpatient Encounter Medications as of 09/13/2021  Medication Sig   acetaminophen (TYLENOL) 650 MG CR tablet Take 650 mg by mouth every 6 (six) hours.   amiodarone (PACERONE) 200 MG tablet Take 1 tablet (200 mg total) by mouth daily.   amLODipine (NORVASC) 5 MG tablet Take 1 tablet (5 mg total) by mouth daily.   darifenacin (ENABLEX) 7.5 MG 24 hr tablet Take 7.5 mg by mouth daily.   Lidocaine 4 % PTCH Apply 2 patches topically. to lower back daily for pain management.   Magnesium Hydroxide (MILK OF MAGNESIA  PO) Take by mouth. 30 ml by mouth daily as needed for constipation.   metoprolol succinate (TOPROL-XL) 25 MG 24 hr tablet Take 0.5 tablets (12.5 mg total) by mouth daily.   NON FORMULARY Diet:Regular   omeprazole (PRILOSEC) 40 MG capsule Take 1 capsule (40 mg total) by mouth daily.   potassium chloride (MICRO-K) 10 MEQ CR capsule Take 1 capsule (10 mEq total) by mouth daily.   Teriparatide, Recombinant, (FORTEO) 600 MCG/2.4ML SOPN Inject into the skin. 20 mcg Once A Day for osteoporosis with sacral and pelvic fractures   No facility-administered encounter medications on file as of 09/13/2021.     Vitals:   09/13/21 1220  BP: (!) 110/56  Pulse: 70  Resp: 18  Temp: 98.4 F (36.9 C)  Weight: 125 lb (56.7 kg)  Height: 5\' 3"  (1.6 m)   Body mass index is 22.14 kg/m.  SIGNIFICANT DIAGNOSTIC EXAMS  PREVIOUS    06-21-21: right hip x-ray:  1. Acute nondisplaced fractures of the right inferior pubic ramus, left superior pubic ramus and left parasymphyseal pubic bone. 2. Subacute or possibly acute fracture in the right sacrum, likely insufficiency fracture. 3. Osteopenia and other findings as described.   06-21-21: ct of head and cervical spine:  No acute intracranial abnormality. Small right-sided scalp hematoma. Chronic small vessel ischemic disease. No acute cervical spine fracture. Multilevel degenerative disc disease and facet arthropathy   07-10-21: chest x-ray:  Opacification left hemidiaphragm with mild patchy density compatible with left basilar pneumonia small left pleural effusion Mild cardiomegaly Mild degree osteopenia Mild osteoarthritis   07-17-21: chest x-ray: improvement in left base pneumonia   07-21-21: pelvic x-ray:  Degenerative changes of lumbar spine Bilateral fracture of superior/inferior pubic rami appear acute Hip unremarkable.   07-25-21: ct of pelvis:  1. Subacute comminuted right sacral insufficiency fracture with sclerosis along the fracture cleft. 2.  Acute nondisplaced mildly comminuted left sacral insufficiency fracture. 3. Ununited left inferior pubic ramus fracture with mild surrounding callus formation. 4. Severely comminuted left superior pubic ramus fracture involving the pubic body. 5. Mildly comminuted right superior pubic ramus-acetabular junction fracture with mild surrounding callus formation. 6. Ununited mildly comminuted right inferior pubic ramus fracture. 7. Moderate osteoarthritis of the right hip with small marginal osteophytes. 8. Given the patient's age and osteopenia, if there is persistent clinical concern for an occult hip fracture, a MRI of the hip is recommended for increased sensitivity.  07-26-21: MRI hips 1. Redemonstration of bilateral sacral alae, bilateral inferior pubic ramus, left superior pubic ramus and adjacent pubic body and right superior pubic ramus-acetabular junction acute to subacute fractures. 2. 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 No acute fracture is seen within either proximal femur. 3. Edema throughout the bilateral adductor groin musculature prominent likely secondary to muscle strains in  the adjacent pelvic fractures.  NO NEW EXAMS.    LABS REVIEWED: PREVIOUS    06-21-21 wbc 9.1; hgb 11.5; hct 34.6; mcv 100.6 plt 268; glucose 124; bun 38; creat 1.93; k+ 4.1; na++ 139; ca 8.6; GFR 24 06-22-21: vitamin B12: 506; folate 24.1 06-23-21: wbc 8.8; hgb 10.1; hct 30.1; mcv 101.7 plt 234; glucose 108; bun 37; creat 2.04; k+ 4.7; na++ 138; ca 8.6; GFR 32; mag 2.1   07-10-21; wbc 9.3; hgb 11.1; hct 32.4; mcv 98.8 plt 415; glucose 109; bun 24; creat 1.58; k+ 4.2; na++ 134; ca 8.6; GFR 31; d-dimer 4.34; CRP 8.1 07-18-21: CRP 20.8; d-dimer 5.87 sed rate: 40. 07-24-21: crp  07-27-21: vitamin D 64.22 tsh 0.958 free t4: 1.57; pth 30 ca 8.3; ionized ca 4.7  08-10-21: d-dimer 5.92   NO NEW LABS.   Review of Systems  Constitutional:  Negative for malaise/fatigue.  Respiratory:  Negative  for cough and shortness of breath.   Cardiovascular:  Negative for chest pain, palpitations and leg swelling.  Gastrointestinal:  Negative for abdominal pain, constipation and heartburn.  Musculoskeletal:  Negative for back pain, joint pain and myalgias.  Skin: Negative.   Neurological:  Negative for dizziness.  Psychiatric/Behavioral:  The patient is not nervous/anxious.    Physical Exam Constitutional:      General: She is not in acute distress.    Appearance: She is well-developed. She is not diaphoretic.  Neck:     Thyroid: No thyromegaly.  Cardiovascular:     Rate and Rhythm: Normal rate and regular rhythm.     Pulses: Normal pulses.     Heart sounds: Normal heart sounds.  Pulmonary:     Effort: Pulmonary effort is normal. No respiratory distress.     Breath sounds: Normal breath sounds.  Abdominal:     General: Bowel sounds are normal. There is no distension.     Palpations: Abdomen is soft.     Tenderness: There is no abdominal tenderness.  Musculoskeletal:        General: Normal range of motion.     Cervical back: Neck supple.     Right lower leg: No edema.     Left lower leg: No edema.  Lymphadenopathy:     Cervical: No cervical adenopathy.  Skin:    General: Skin is warm and dry.  Neurological:     Mental Status: She is alert and oriented to person, place, and time.  Psychiatric:        Mood and Affect: Mood normal.      Assessment/Plan:    Patient is being discharged with the following home health services:  pt/ot to evaluate and treat as indicated for gait balance strength adl training.   Patient is being discharged with the following durable medical equipment:  wheelchair to allow her to maintain her current level of independence with her adls. Will need a halo safety ring.   Patient has been advised to f/u with their PCP in 1-2 weeks to for a transitions of care visit.  Social services at their facility was responsible for arranging this appointment.  Pt  was provided with adequate prescriptions of noncontrolled medications to reach the scheduled appointment .  For controlled substances, a limited supply was provided as appropriate for the individual patient.  If the pt normally receives these medications from a pain clinic or has a contract with another physician, these medications should be received from that clinic or physician only).    Her medications will be  provided by the ALF.   Time spent with patient: 35 minutes: home health dme.    Ok Edwards NP [facility]  call [phone]

## 2021-09-15 ENCOUNTER — Encounter: Payer: Self-pay | Admitting: Orthopedic Surgery

## 2021-09-15 ENCOUNTER — Ambulatory Visit (INDEPENDENT_AMBULATORY_CARE_PROVIDER_SITE_OTHER): Payer: Medicare Other | Admitting: Orthopedic Surgery

## 2021-09-15 ENCOUNTER — Ambulatory Visit (INDEPENDENT_AMBULATORY_CARE_PROVIDER_SITE_OTHER): Payer: Medicare Other

## 2021-09-15 DIAGNOSIS — S32592D Other specified fracture of left pubis, subsequent encounter for fracture with routine healing: Secondary | ICD-10-CM

## 2021-09-15 DIAGNOSIS — N184 Chronic kidney disease, stage 4 (severe): Secondary | ICD-10-CM | POA: Diagnosis not present

## 2021-09-15 DIAGNOSIS — S32591D Other specified fracture of right pubis, subsequent encounter for fracture with routine healing: Secondary | ICD-10-CM | POA: Diagnosis not present

## 2021-09-15 DIAGNOSIS — S3210XD Unspecified fracture of sacrum, subsequent encounter for fracture with routine healing: Secondary | ICD-10-CM | POA: Diagnosis not present

## 2021-09-15 DIAGNOSIS — S32110D Nondisplaced Zone I fracture of sacrum, subsequent encounter for fracture with routine healing: Secondary | ICD-10-CM | POA: Diagnosis not present

## 2021-09-15 DIAGNOSIS — S3282XK Multiple fractures of pelvis without disruption of pelvic ring, subsequent encounter for fracture with nonunion: Secondary | ICD-10-CM | POA: Diagnosis not present

## 2021-09-15 NOTE — Progress Notes (Signed)
FOLLOW UP   Encounter Diagnoses  Name Primary?   Closed fracture of sacrum with routine healing, unspecified portion of sacrum, subsequent encounter Yes   Closed bilateral fracture of pubic rami with routine healing, subsequent encounter      Chief Complaint  Patient presents with   Hip Pain    Pubic ramus fractures 06/20/21    MRI IMPRESSION: 1. Redemonstration of bilateral sacral alae, bilateral inferior pubic ramus, left superior pubic ramus and adjacent pubic body and right superior pubic ramus-acetabular junction acute to subacute fractures. 2. 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 No acute fracture is seen within either proximal femur. 3. Edema throughout the bilateral adductor groin musculature prominent likely secondary to muscle strains in the adjacent pelvic fractures.     Doing well  Not having pain   Xray's multiple frx around the pelvis have healed without major displacement   Ok to release

## 2021-09-18 DIAGNOSIS — I129 Hypertensive chronic kidney disease with stage 1 through stage 4 chronic kidney disease, or unspecified chronic kidney disease: Secondary | ICD-10-CM | POA: Diagnosis not present

## 2021-09-18 DIAGNOSIS — I482 Chronic atrial fibrillation, unspecified: Secondary | ICD-10-CM | POA: Diagnosis not present

## 2021-09-18 DIAGNOSIS — M800AXD Age-related osteoporosis with current pathological fracture, other site, subsequent encounter for fracture with routine healing: Secondary | ICD-10-CM | POA: Diagnosis not present

## 2021-09-18 DIAGNOSIS — U071 COVID-19: Secondary | ICD-10-CM | POA: Diagnosis not present

## 2021-09-18 DIAGNOSIS — N184 Chronic kidney disease, stage 4 (severe): Secondary | ICD-10-CM | POA: Diagnosis not present

## 2021-09-18 DIAGNOSIS — K219 Gastro-esophageal reflux disease without esophagitis: Secondary | ICD-10-CM | POA: Diagnosis not present

## 2021-09-19 DIAGNOSIS — N184 Chronic kidney disease, stage 4 (severe): Secondary | ICD-10-CM | POA: Diagnosis not present

## 2021-09-19 DIAGNOSIS — I129 Hypertensive chronic kidney disease with stage 1 through stage 4 chronic kidney disease, or unspecified chronic kidney disease: Secondary | ICD-10-CM | POA: Diagnosis not present

## 2021-09-19 DIAGNOSIS — M800AXD Age-related osteoporosis with current pathological fracture, other site, subsequent encounter for fracture with routine healing: Secondary | ICD-10-CM | POA: Diagnosis not present

## 2021-09-19 DIAGNOSIS — U071 COVID-19: Secondary | ICD-10-CM | POA: Diagnosis not present

## 2021-09-19 DIAGNOSIS — K219 Gastro-esophageal reflux disease without esophagitis: Secondary | ICD-10-CM | POA: Diagnosis not present

## 2021-09-19 DIAGNOSIS — I482 Chronic atrial fibrillation, unspecified: Secondary | ICD-10-CM | POA: Diagnosis not present

## 2021-09-21 DIAGNOSIS — K219 Gastro-esophageal reflux disease without esophagitis: Secondary | ICD-10-CM | POA: Diagnosis not present

## 2021-09-21 DIAGNOSIS — I10 Essential (primary) hypertension: Secondary | ICD-10-CM | POA: Diagnosis not present

## 2021-09-21 DIAGNOSIS — U071 COVID-19: Secondary | ICD-10-CM | POA: Diagnosis not present

## 2021-09-21 DIAGNOSIS — N3281 Overactive bladder: Secondary | ICD-10-CM | POA: Diagnosis not present

## 2021-09-21 DIAGNOSIS — I482 Chronic atrial fibrillation, unspecified: Secondary | ICD-10-CM | POA: Diagnosis not present

## 2021-09-21 DIAGNOSIS — N184 Chronic kidney disease, stage 4 (severe): Secondary | ICD-10-CM | POA: Diagnosis not present

## 2021-09-21 DIAGNOSIS — Z299 Encounter for prophylactic measures, unspecified: Secondary | ICD-10-CM | POA: Diagnosis not present

## 2021-09-21 DIAGNOSIS — K649 Unspecified hemorrhoids: Secondary | ICD-10-CM | POA: Diagnosis not present

## 2021-09-21 DIAGNOSIS — D692 Other nonthrombocytopenic purpura: Secondary | ICD-10-CM | POA: Diagnosis not present

## 2021-09-21 DIAGNOSIS — M800AXD Age-related osteoporosis with current pathological fracture, other site, subsequent encounter for fracture with routine healing: Secondary | ICD-10-CM | POA: Diagnosis not present

## 2021-09-21 DIAGNOSIS — I129 Hypertensive chronic kidney disease with stage 1 through stage 4 chronic kidney disease, or unspecified chronic kidney disease: Secondary | ICD-10-CM | POA: Diagnosis not present

## 2021-09-25 DIAGNOSIS — M800AXD Age-related osteoporosis with current pathological fracture, other site, subsequent encounter for fracture with routine healing: Secondary | ICD-10-CM | POA: Diagnosis not present

## 2021-09-25 DIAGNOSIS — K219 Gastro-esophageal reflux disease without esophagitis: Secondary | ICD-10-CM | POA: Diagnosis not present

## 2021-09-25 DIAGNOSIS — I129 Hypertensive chronic kidney disease with stage 1 through stage 4 chronic kidney disease, or unspecified chronic kidney disease: Secondary | ICD-10-CM | POA: Diagnosis not present

## 2021-09-25 DIAGNOSIS — N184 Chronic kidney disease, stage 4 (severe): Secondary | ICD-10-CM | POA: Diagnosis not present

## 2021-09-25 DIAGNOSIS — U071 COVID-19: Secondary | ICD-10-CM | POA: Diagnosis not present

## 2021-09-25 DIAGNOSIS — I482 Chronic atrial fibrillation, unspecified: Secondary | ICD-10-CM | POA: Diagnosis not present

## 2021-09-26 DIAGNOSIS — I482 Chronic atrial fibrillation, unspecified: Secondary | ICD-10-CM | POA: Diagnosis not present

## 2021-09-26 DIAGNOSIS — N184 Chronic kidney disease, stage 4 (severe): Secondary | ICD-10-CM | POA: Diagnosis not present

## 2021-09-26 DIAGNOSIS — I129 Hypertensive chronic kidney disease with stage 1 through stage 4 chronic kidney disease, or unspecified chronic kidney disease: Secondary | ICD-10-CM | POA: Diagnosis not present

## 2021-09-26 DIAGNOSIS — U071 COVID-19: Secondary | ICD-10-CM | POA: Diagnosis not present

## 2021-09-26 DIAGNOSIS — M800AXD Age-related osteoporosis with current pathological fracture, other site, subsequent encounter for fracture with routine healing: Secondary | ICD-10-CM | POA: Diagnosis not present

## 2021-09-26 DIAGNOSIS — K219 Gastro-esophageal reflux disease without esophagitis: Secondary | ICD-10-CM | POA: Diagnosis not present

## 2021-09-27 DIAGNOSIS — I482 Chronic atrial fibrillation, unspecified: Secondary | ICD-10-CM | POA: Diagnosis not present

## 2021-09-27 DIAGNOSIS — U071 COVID-19: Secondary | ICD-10-CM | POA: Diagnosis not present

## 2021-09-27 DIAGNOSIS — I129 Hypertensive chronic kidney disease with stage 1 through stage 4 chronic kidney disease, or unspecified chronic kidney disease: Secondary | ICD-10-CM | POA: Diagnosis not present

## 2021-09-27 DIAGNOSIS — K219 Gastro-esophageal reflux disease without esophagitis: Secondary | ICD-10-CM | POA: Diagnosis not present

## 2021-09-27 DIAGNOSIS — N184 Chronic kidney disease, stage 4 (severe): Secondary | ICD-10-CM | POA: Diagnosis not present

## 2021-09-27 DIAGNOSIS — M800AXD Age-related osteoporosis with current pathological fracture, other site, subsequent encounter for fracture with routine healing: Secondary | ICD-10-CM | POA: Diagnosis not present

## 2021-09-28 DIAGNOSIS — Z299 Encounter for prophylactic measures, unspecified: Secondary | ICD-10-CM | POA: Diagnosis not present

## 2021-09-28 DIAGNOSIS — M545 Low back pain, unspecified: Secondary | ICD-10-CM | POA: Diagnosis not present

## 2021-09-28 DIAGNOSIS — M19021 Primary osteoarthritis, right elbow: Secondary | ICD-10-CM | POA: Diagnosis not present

## 2021-09-28 DIAGNOSIS — M5136 Other intervertebral disc degeneration, lumbar region: Secondary | ICD-10-CM | POA: Diagnosis not present

## 2021-09-28 DIAGNOSIS — N184 Chronic kidney disease, stage 4 (severe): Secondary | ICD-10-CM | POA: Diagnosis not present

## 2021-09-28 DIAGNOSIS — U071 COVID-19: Secondary | ICD-10-CM | POA: Diagnosis not present

## 2021-09-28 DIAGNOSIS — M8008XD Age-related osteoporosis with current pathological fracture, vertebra(e), subsequent encounter for fracture with routine healing: Secondary | ICD-10-CM | POA: Diagnosis not present

## 2021-09-28 DIAGNOSIS — M25521 Pain in right elbow: Secondary | ICD-10-CM | POA: Diagnosis not present

## 2021-09-28 DIAGNOSIS — J449 Chronic obstructive pulmonary disease, unspecified: Secondary | ICD-10-CM | POA: Diagnosis not present

## 2021-09-28 DIAGNOSIS — I129 Hypertensive chronic kidney disease with stage 1 through stage 4 chronic kidney disease, or unspecified chronic kidney disease: Secondary | ICD-10-CM | POA: Diagnosis not present

## 2021-10-02 DIAGNOSIS — I482 Chronic atrial fibrillation, unspecified: Secondary | ICD-10-CM | POA: Diagnosis not present

## 2021-10-02 DIAGNOSIS — U071 COVID-19: Secondary | ICD-10-CM | POA: Diagnosis not present

## 2021-10-02 DIAGNOSIS — M800AXD Age-related osteoporosis with current pathological fracture, other site, subsequent encounter for fracture with routine healing: Secondary | ICD-10-CM | POA: Diagnosis not present

## 2021-10-02 DIAGNOSIS — I129 Hypertensive chronic kidney disease with stage 1 through stage 4 chronic kidney disease, or unspecified chronic kidney disease: Secondary | ICD-10-CM | POA: Diagnosis not present

## 2021-10-02 DIAGNOSIS — K219 Gastro-esophageal reflux disease without esophagitis: Secondary | ICD-10-CM | POA: Diagnosis not present

## 2021-10-02 DIAGNOSIS — N184 Chronic kidney disease, stage 4 (severe): Secondary | ICD-10-CM | POA: Diagnosis not present

## 2021-10-04 DIAGNOSIS — M800AXD Age-related osteoporosis with current pathological fracture, other site, subsequent encounter for fracture with routine healing: Secondary | ICD-10-CM | POA: Diagnosis not present

## 2021-10-04 DIAGNOSIS — N184 Chronic kidney disease, stage 4 (severe): Secondary | ICD-10-CM | POA: Diagnosis not present

## 2021-10-04 DIAGNOSIS — K219 Gastro-esophageal reflux disease without esophagitis: Secondary | ICD-10-CM | POA: Diagnosis not present

## 2021-10-04 DIAGNOSIS — I482 Chronic atrial fibrillation, unspecified: Secondary | ICD-10-CM | POA: Diagnosis not present

## 2021-10-04 DIAGNOSIS — I129 Hypertensive chronic kidney disease with stage 1 through stage 4 chronic kidney disease, or unspecified chronic kidney disease: Secondary | ICD-10-CM | POA: Diagnosis not present

## 2021-10-04 DIAGNOSIS — U071 COVID-19: Secondary | ICD-10-CM | POA: Diagnosis not present

## 2021-10-05 DIAGNOSIS — M800AXD Age-related osteoporosis with current pathological fracture, other site, subsequent encounter for fracture with routine healing: Secondary | ICD-10-CM | POA: Diagnosis not present

## 2021-10-05 DIAGNOSIS — Z299 Encounter for prophylactic measures, unspecified: Secondary | ICD-10-CM | POA: Diagnosis not present

## 2021-10-05 DIAGNOSIS — U071 COVID-19: Secondary | ICD-10-CM | POA: Diagnosis not present

## 2021-10-05 DIAGNOSIS — M545 Low back pain, unspecified: Secondary | ICD-10-CM | POA: Diagnosis not present

## 2021-10-05 DIAGNOSIS — M25571 Pain in right ankle and joints of right foot: Secondary | ICD-10-CM | POA: Diagnosis not present

## 2021-10-05 DIAGNOSIS — I129 Hypertensive chronic kidney disease with stage 1 through stage 4 chronic kidney disease, or unspecified chronic kidney disease: Secondary | ICD-10-CM | POA: Diagnosis not present

## 2021-10-05 DIAGNOSIS — N184 Chronic kidney disease, stage 4 (severe): Secondary | ICD-10-CM | POA: Diagnosis not present

## 2021-10-05 DIAGNOSIS — K219 Gastro-esophageal reflux disease without esophagitis: Secondary | ICD-10-CM | POA: Diagnosis not present

## 2021-10-05 DIAGNOSIS — I482 Chronic atrial fibrillation, unspecified: Secondary | ICD-10-CM | POA: Diagnosis not present

## 2021-10-10 DIAGNOSIS — I482 Chronic atrial fibrillation, unspecified: Secondary | ICD-10-CM | POA: Diagnosis not present

## 2021-10-10 DIAGNOSIS — M800AXD Age-related osteoporosis with current pathological fracture, other site, subsequent encounter for fracture with routine healing: Secondary | ICD-10-CM | POA: Diagnosis not present

## 2021-10-10 DIAGNOSIS — K219 Gastro-esophageal reflux disease without esophagitis: Secondary | ICD-10-CM | POA: Diagnosis not present

## 2021-10-10 DIAGNOSIS — N184 Chronic kidney disease, stage 4 (severe): Secondary | ICD-10-CM | POA: Diagnosis not present

## 2021-10-10 DIAGNOSIS — I129 Hypertensive chronic kidney disease with stage 1 through stage 4 chronic kidney disease, or unspecified chronic kidney disease: Secondary | ICD-10-CM | POA: Diagnosis not present

## 2021-10-10 DIAGNOSIS — U071 COVID-19: Secondary | ICD-10-CM | POA: Diagnosis not present

## 2021-10-11 DIAGNOSIS — U071 COVID-19: Secondary | ICD-10-CM | POA: Diagnosis not present

## 2021-10-11 DIAGNOSIS — K219 Gastro-esophageal reflux disease without esophagitis: Secondary | ICD-10-CM | POA: Diagnosis not present

## 2021-10-11 DIAGNOSIS — M800AXD Age-related osteoporosis with current pathological fracture, other site, subsequent encounter for fracture with routine healing: Secondary | ICD-10-CM | POA: Diagnosis not present

## 2021-10-11 DIAGNOSIS — I482 Chronic atrial fibrillation, unspecified: Secondary | ICD-10-CM | POA: Diagnosis not present

## 2021-10-11 DIAGNOSIS — N184 Chronic kidney disease, stage 4 (severe): Secondary | ICD-10-CM | POA: Diagnosis not present

## 2021-10-11 DIAGNOSIS — I129 Hypertensive chronic kidney disease with stage 1 through stage 4 chronic kidney disease, or unspecified chronic kidney disease: Secondary | ICD-10-CM | POA: Diagnosis not present

## 2021-10-12 DIAGNOSIS — J449 Chronic obstructive pulmonary disease, unspecified: Secondary | ICD-10-CM | POA: Diagnosis not present

## 2021-10-12 DIAGNOSIS — Z299 Encounter for prophylactic measures, unspecified: Secondary | ICD-10-CM | POA: Diagnosis not present

## 2021-10-12 DIAGNOSIS — M25571 Pain in right ankle and joints of right foot: Secondary | ICD-10-CM | POA: Diagnosis not present

## 2021-10-15 DIAGNOSIS — N184 Chronic kidney disease, stage 4 (severe): Secondary | ICD-10-CM | POA: Diagnosis not present

## 2021-10-15 DIAGNOSIS — S3282XK Multiple fractures of pelvis without disruption of pelvic ring, subsequent encounter for fracture with nonunion: Secondary | ICD-10-CM | POA: Diagnosis not present

## 2021-10-15 DIAGNOSIS — S32110D Nondisplaced Zone I fracture of sacrum, subsequent encounter for fracture with routine healing: Secondary | ICD-10-CM | POA: Diagnosis not present

## 2021-10-16 DIAGNOSIS — U071 COVID-19: Secondary | ICD-10-CM | POA: Diagnosis not present

## 2021-10-16 DIAGNOSIS — I10 Essential (primary) hypertension: Secondary | ICD-10-CM | POA: Diagnosis not present

## 2021-10-16 DIAGNOSIS — I739 Peripheral vascular disease, unspecified: Secondary | ICD-10-CM | POA: Diagnosis not present

## 2021-10-16 DIAGNOSIS — M7500 Adhesive capsulitis of unspecified shoulder: Secondary | ICD-10-CM | POA: Diagnosis not present

## 2021-10-16 DIAGNOSIS — N184 Chronic kidney disease, stage 4 (severe): Secondary | ICD-10-CM | POA: Diagnosis not present

## 2021-10-16 DIAGNOSIS — K219 Gastro-esophageal reflux disease without esophagitis: Secondary | ICD-10-CM | POA: Diagnosis not present

## 2021-10-16 DIAGNOSIS — I480 Paroxysmal atrial fibrillation: Secondary | ICD-10-CM | POA: Diagnosis not present

## 2021-10-16 DIAGNOSIS — I129 Hypertensive chronic kidney disease with stage 1 through stage 4 chronic kidney disease, or unspecified chronic kidney disease: Secondary | ICD-10-CM | POA: Diagnosis not present

## 2021-10-16 DIAGNOSIS — M800AXD Age-related osteoporosis with current pathological fracture, other site, subsequent encounter for fracture with routine healing: Secondary | ICD-10-CM | POA: Diagnosis not present

## 2021-10-16 DIAGNOSIS — Z299 Encounter for prophylactic measures, unspecified: Secondary | ICD-10-CM | POA: Diagnosis not present

## 2021-10-16 DIAGNOSIS — I482 Chronic atrial fibrillation, unspecified: Secondary | ICD-10-CM | POA: Diagnosis not present

## 2021-10-17 ENCOUNTER — Non-Acute Institutional Stay: Payer: Medicare Other | Admitting: Family Medicine

## 2021-10-17 VITALS — BP 114/72 | HR 72 | Resp 14 | Wt 120.0 lb

## 2021-10-17 DIAGNOSIS — Z515 Encounter for palliative care: Secondary | ICD-10-CM | POA: Diagnosis not present

## 2021-10-17 DIAGNOSIS — S3282XS Multiple fractures of pelvis without disruption of pelvic ring, sequela: Secondary | ICD-10-CM | POA: Diagnosis not present

## 2021-10-17 DIAGNOSIS — F4321 Adjustment disorder with depressed mood: Secondary | ICD-10-CM

## 2021-10-17 DIAGNOSIS — I482 Chronic atrial fibrillation, unspecified: Secondary | ICD-10-CM | POA: Diagnosis not present

## 2021-10-18 ENCOUNTER — Encounter: Payer: Self-pay | Admitting: Family Medicine

## 2021-10-18 NOTE — Progress Notes (Unsigned)
[facility] Consult Note Telephone: [phone]  Fax: [phone]   Date of encounter: 10/18/21 10:24 PM PATIENT NAME: Paula Mason [address] [address]   [phone] (home)  DOB: 14-Jan-1929 MRN: [id number] PRIMARY CARE PROVIDER:    Monico Blitz, MD,  [address] [place] [address] [phone]  REFERRING PROVIDER:   Monico Blitz, [place] [address],  [place] [address] [phone]  RESPONSIBLE PARTY:    Contact Information     Name Relation Home Work [place] [phone]     Garry Heater   [phone]        I met face to face with patient, nephew Isidore Moos and his wife in pt's AL facility. Palliative Care was asked to follow this patient by consultation request of  Monico Blitz, MD to address advance care planning and complex medical decision making. This is the initial visit.          ASSESSMENT, SYMPTOM MANAGEMENT AND PLAN / RECOMMENDATIONS:  Multiple Pelvic Fractures Encourage continued use of rolling walker and participation with PT Tylenol prn as ordered for pain Continue daily forteo injections   2. Chronic atrial fibrillation Rate controlled on Amiodarone and Norvasc Not anticoagulated d/t fall with multiple pelvic fractures and age  88. Palliative Care Encounter Has DNR Discussed components of MOST form (does not want intubation and would prefer to avoid hospitalization Will continue to follow and assist with creation of MOST when ready Nephew is supportive of whatever patient chooses  4.  Adjustment disorder with depressed mood Encourage participation in activities with other residents of facility. SW referral to identify if possible chaplain resources in area and provide counseling    Follow up Palliative Care Visit: Palliative care will continue to follow for complex medical decision making, advance care planning, and clarification of goals. Return 4 weeks or  prn.    This visit was coded based on medical decision making (MDM).  PPS: 70%  HOSPICE ELIGIBILITY/DIAGNOSIS: TBD  Chief Complaint:  [facility] received a referral to follow up with patient for chronic disease management following fall with severe pelvic fractures in setting of osteoporosis and recent COVID 19 infection.  Palliative Care is also following for help with advance directive planning and defining/refining goals. of care.   HISTORY OF PRESENT ILLNESS:  Paula Mason is a 88 y.o. year old female with HTN, chronic atrial fibrillation, GER, PUD who fell in March 2023 with multiple pelvic/sacral fractures due to osteoporosis and CKD.  As she was rehabilitating, she developed severe Covid 19 infection in April 2023 which was very debilitating and resulted in weight loss.  She has significantly improved and continues to rehab with PT.  She has no children, never married and her nephew Isidore Moos is her POA and next of kin.  He and his wife are visiting patient today from their home 4-5 hours away in [place]. Pt denies significant pain currently but is self isolating some in her room.  As of last year pt was still driving and doing things for herself.  Pt is having difficulty coping but is beginning to socialize some with other residents of the facility.  Nephew asks about the possibility of having a chaplain visit or have someone to help with depression.  Appetite is pretty good and pt has managed to regain some of the weight she has lost.  She has had no further falls, denies CP, SOB,  nausea, vomiting, or constipation. Pt had been on anticoagulation but as a result of the fall with multiple pelvic fractures pt's anticoagulation was stopped.  History obtained from review of EMR, discussion  with family, facility staff and/or Ms. Pavlov.   06/21/21: CT right hip without contrast: IMPRESSION: 1. Acute nondisplaced fractures of the right inferior pubic ramus,  left superior pubic ramus and left parasymphyseal pubic bone. 2. Subacute or possibly acute fracture in the right sacrum, likely insufficiency fracture. 3. Osteopenia and other findings as described  07/10/21 labs: BMP remarkable for low Na 134, calcium 8.6, eGFR 31 and elevated glucose 109, Cr 1.58, BUN 24. CBC remarkable for low RBC 3.28, HGB 11.1 and HCT 32.4% with elevated PLT 415 07/27/21 Intact PTH 30, low cal 8.3. TSH normal at 0.958 and elevated Free T4 of 1.57  09/15/21 2 view pelvic xray showed multiple healing fractures   During her Covid 19 and immediate convalescent phase she had an elevate ESR 40 (07/19/21), D-dimer 4.45 (08/24/21) I reviewed available labs, medications, imaging, studies and related documents from the EMR.  Records reviewed and summarized above.   ROS General: NAD Cardiovascular: denies chest pain, denies DOE Pulmonary: denies cough, denies increased SOB Abdomen: endorses good appetite, denies constipation, endorses continence of bowel GU: denies dysuria, endorses continence of urine with some stress incontinence on standing since pelvic fractures MSK:  denies increased weakness, no falls reported, increased gait stability with use of rolling walker Neurological: endorses some intermittent pain, in RLE, denies insomnia Psych: Endorses depressed mood Heme/lymph/immuno: denies bruises, abnormal bleeding  Physical Exam: Current and past weights: 120 lbs  Constitutional: NAD General: thin  ENMT: intact hearing CV: S1S2, IRIR, no LE edema Pulmonary: CTAB, no increased work of breathing, no cough, room air Abdomen: normo-active BS + 4 quadrants, soft and non tender MSK: moves all extremities, ambulatory with rolling walker Skin: warm and dry, no rashes or wounds on visible skin Neuro:  no generalized weakness, no cognitive impairment Psych: flat affect, A and O x 3 Hem/lymph/immuno: no widespread bruising  CURRENT PROBLEM LIST:  Patient Active Problem List    Diagnosis Date Noted   UI (urinary incontinence) 09/08/2021   Weight loss 08/09/2021   Closed fracture of left hip ([place]) 08/04/2021   Osteoarthritis, hip, bilateral 07/31/2021   Sacral fracture, closed ([place]) 07/27/2021   Hypercoagulable state associated with COVID-19 ([place]) 07/19/2021   CRP elevated 07/19/2021   SARS-CoV-2 positive 07/13/2021   Multiple fractures of pelvis ([place]) 06/21/2021   Fall 06/21/2021   CKD (chronic kidney disease) 06/21/2021   Peptic ulcer 06/21/2021   GERD (gastroesophageal reflux disease) 06/21/2021   Essential hypertension 06/21/2021   Atrial fibrillation, chronic ([place]) 06/21/2021   PAST MEDICAL HISTORY:  Active Ambulatory Problems    Diagnosis Date Noted   Multiple fractures of pelvis ([place]) 06/21/2021   Fall 06/21/2021   CKD (chronic kidney disease) 06/21/2021   Peptic ulcer 06/21/2021   GERD (gastroesophageal reflux disease) 06/21/2021   Essential hypertension 06/21/2021   Atrial fibrillation, chronic ([place]) 06/21/2021   SARS-CoV-2 positive 07/13/2021   Hypercoagulable state associated with COVID-19 ([place]) 07/19/2021   CRP elevated 07/19/2021   Sacral fracture, closed ([place]) 07/27/2021   Osteoarthritis, hip, bilateral 07/31/2021   Closed fracture of left hip ([place]) 08/04/2021   Weight loss 08/09/2021   UI (urinary incontinence) 09/08/2021   Resolved Ambulatory Problems    Diagnosis Date Noted   No Resolved Ambulatory Problems   Past Medical History:  Diagnosis Date   Arthritis  Hypertension    SOCIAL HX:  Social History   Tobacco Use   Smoking status: Never   Smokeless tobacco: Never  Substance Use Topics   Alcohol use: No   FAMILY HX: No family history on file.     Preferred Pharmacy: ALLERGIES: No Known Allergies   PERTINENT MEDICATIONS:  Outpatient Encounter Medications as of 10/17/2021  Medication Sig   busPIRone (BUSPAR) 10 MG tablet Take 10 mg by mouth 3 (three) times daily.   hydrocortisone (ANUSOL-HC) 2.5 % rectal  cream Place 1 Application rectally 2 (two) times daily as needed.   oxybutynin (DITROPAN) 5 MG tablet Take 5 mg by mouth daily. OAB   acetaminophen (TYLENOL) 650 MG CR tablet Take 650 mg by mouth every 6 (six) hours.   amiodarone (PACERONE) 200 MG tablet Take 1 tablet (200 mg total) by mouth daily.   amLODipine (NORVASC) 5 MG tablet Take 1 tablet (5 mg total) by mouth daily.   darifenacin (ENABLEX) 7.5 MG 24 hr tablet Take 7.5 mg by mouth daily.   Lidocaine 4 % PTCH Apply 2 patches topically. to lower back daily for pain management.   Magnesium Hydroxide (MILK OF MAGNESIA PO) Take by mouth. 30 ml by mouth daily as needed for constipation.   metoprolol succinate (TOPROL-XL) 25 MG 24 hr tablet Take 0.5 tablets (12.5 mg total) by mouth daily.   mirtazapine (REMERON) 7.5 MG tablet Take 7.5 mg by mouth at bedtime.   NON FORMULARY Diet:Regular   omeprazole (PRILOSEC) 40 MG capsule Take 1 capsule (40 mg total) by mouth daily.   potassium chloride (MICRO-K) 10 MEQ CR capsule Take 1 capsule (10 mEq total) by mouth daily.   Teriparatide, Recombinant, (FORTEO) 600 MCG/2.4ML SOPN Inject into the skin. 20 mcg Once A Day for osteoporosis with sacral and pelvic fractures   No facility-administered encounter medications on file as of 10/17/2021.     -------------------------------------------------------------------------------- Advance Care Planning/Goals of Care: Goals include to maximize quality of life and symptom management.  Pt and health care surrogate gave their permission to discuss.Our advance care planning conversation included a discussion about:    The value and importance of advance care planning  Experiences with loved ones who have been seriously ill or have died-sister died with pneumonia and she and her nephew had to decide to stop antibiotics Exploration of goals of care in the event of a sudden injury or illness-discussed MOST options Identification  of a healthcare agent-nephew Sharmon Leyden Review of an advance directive document -DNR, reviewed MOST form.  Pt stating she does not want intubation or transfer to hospital Decision not to resuscitate or to de-escalate disease focused treatments due to poor prognosis. CODE STATUS: DNR   Thank you for the opportunity to participate in the care of Ms. Percival.  The palliative care team will continue to follow. Please call our office at [phone] if we can be of additional assistance.   Marijo Conception, FNP-C  COVID-19 PATIENT SCREENING TOOL Asked and negative response unless otherwise noted:  Have you had symptoms of covid, tested positive or been in contact with someone with symptoms/positive test in the past 5-10 days?  No

## 2021-10-19 DIAGNOSIS — U071 COVID-19: Secondary | ICD-10-CM | POA: Diagnosis not present

## 2021-10-19 DIAGNOSIS — K219 Gastro-esophageal reflux disease without esophagitis: Secondary | ICD-10-CM | POA: Diagnosis not present

## 2021-10-19 DIAGNOSIS — Z515 Encounter for palliative care: Secondary | ICD-10-CM | POA: Insufficient documentation

## 2021-10-19 DIAGNOSIS — M800AXD Age-related osteoporosis with current pathological fracture, other site, subsequent encounter for fracture with routine healing: Secondary | ICD-10-CM | POA: Diagnosis not present

## 2021-10-19 DIAGNOSIS — I129 Hypertensive chronic kidney disease with stage 1 through stage 4 chronic kidney disease, or unspecified chronic kidney disease: Secondary | ICD-10-CM | POA: Diagnosis not present

## 2021-10-19 DIAGNOSIS — G309 Alzheimer's disease, unspecified: Secondary | ICD-10-CM | POA: Diagnosis not present

## 2021-10-19 DIAGNOSIS — F4321 Adjustment disorder with depressed mood: Secondary | ICD-10-CM | POA: Insufficient documentation

## 2021-10-19 DIAGNOSIS — M25571 Pain in right ankle and joints of right foot: Secondary | ICD-10-CM | POA: Diagnosis not present

## 2021-10-19 DIAGNOSIS — I482 Chronic atrial fibrillation, unspecified: Secondary | ICD-10-CM | POA: Diagnosis not present

## 2021-10-19 DIAGNOSIS — I10 Essential (primary) hypertension: Secondary | ICD-10-CM | POA: Diagnosis not present

## 2021-10-19 DIAGNOSIS — M7500 Adhesive capsulitis of unspecified shoulder: Secondary | ICD-10-CM | POA: Diagnosis not present

## 2021-10-19 DIAGNOSIS — N184 Chronic kidney disease, stage 4 (severe): Secondary | ICD-10-CM | POA: Diagnosis not present

## 2021-10-19 DIAGNOSIS — Z299 Encounter for prophylactic measures, unspecified: Secondary | ICD-10-CM | POA: Diagnosis not present

## 2021-10-23 DIAGNOSIS — I482 Chronic atrial fibrillation, unspecified: Secondary | ICD-10-CM | POA: Diagnosis not present

## 2021-10-23 DIAGNOSIS — N184 Chronic kidney disease, stage 4 (severe): Secondary | ICD-10-CM | POA: Diagnosis not present

## 2021-10-23 DIAGNOSIS — U071 COVID-19: Secondary | ICD-10-CM | POA: Diagnosis not present

## 2021-10-23 DIAGNOSIS — K219 Gastro-esophageal reflux disease without esophagitis: Secondary | ICD-10-CM | POA: Diagnosis not present

## 2021-10-23 DIAGNOSIS — I129 Hypertensive chronic kidney disease with stage 1 through stage 4 chronic kidney disease, or unspecified chronic kidney disease: Secondary | ICD-10-CM | POA: Diagnosis not present

## 2021-10-23 DIAGNOSIS — M800AXD Age-related osteoporosis with current pathological fracture, other site, subsequent encounter for fracture with routine healing: Secondary | ICD-10-CM | POA: Diagnosis not present

## 2021-10-24 DIAGNOSIS — N184 Chronic kidney disease, stage 4 (severe): Secondary | ICD-10-CM | POA: Diagnosis not present

## 2021-10-24 DIAGNOSIS — K219 Gastro-esophageal reflux disease without esophagitis: Secondary | ICD-10-CM | POA: Diagnosis not present

## 2021-10-24 DIAGNOSIS — I129 Hypertensive chronic kidney disease with stage 1 through stage 4 chronic kidney disease, or unspecified chronic kidney disease: Secondary | ICD-10-CM | POA: Diagnosis not present

## 2021-10-24 DIAGNOSIS — U071 COVID-19: Secondary | ICD-10-CM | POA: Diagnosis not present

## 2021-10-24 DIAGNOSIS — I482 Chronic atrial fibrillation, unspecified: Secondary | ICD-10-CM | POA: Diagnosis not present

## 2021-10-24 DIAGNOSIS — M800AXD Age-related osteoporosis with current pathological fracture, other site, subsequent encounter for fracture with routine healing: Secondary | ICD-10-CM | POA: Diagnosis not present

## 2021-10-25 DIAGNOSIS — I482 Chronic atrial fibrillation, unspecified: Secondary | ICD-10-CM | POA: Diagnosis not present

## 2021-10-25 DIAGNOSIS — K219 Gastro-esophageal reflux disease without esophagitis: Secondary | ICD-10-CM | POA: Diagnosis not present

## 2021-10-25 DIAGNOSIS — N184 Chronic kidney disease, stage 4 (severe): Secondary | ICD-10-CM | POA: Diagnosis not present

## 2021-10-25 DIAGNOSIS — I129 Hypertensive chronic kidney disease with stage 1 through stage 4 chronic kidney disease, or unspecified chronic kidney disease: Secondary | ICD-10-CM | POA: Diagnosis not present

## 2021-10-25 DIAGNOSIS — M800AXD Age-related osteoporosis with current pathological fracture, other site, subsequent encounter for fracture with routine healing: Secondary | ICD-10-CM | POA: Diagnosis not present

## 2021-10-25 DIAGNOSIS — U071 COVID-19: Secondary | ICD-10-CM | POA: Diagnosis not present

## 2021-10-26 DIAGNOSIS — U071 COVID-19: Secondary | ICD-10-CM | POA: Diagnosis not present

## 2021-10-26 DIAGNOSIS — I129 Hypertensive chronic kidney disease with stage 1 through stage 4 chronic kidney disease, or unspecified chronic kidney disease: Secondary | ICD-10-CM | POA: Diagnosis not present

## 2021-10-26 DIAGNOSIS — M800AXD Age-related osteoporosis with current pathological fracture, other site, subsequent encounter for fracture with routine healing: Secondary | ICD-10-CM | POA: Diagnosis not present

## 2021-10-26 DIAGNOSIS — N184 Chronic kidney disease, stage 4 (severe): Secondary | ICD-10-CM | POA: Diagnosis not present

## 2021-10-26 DIAGNOSIS — I482 Chronic atrial fibrillation, unspecified: Secondary | ICD-10-CM | POA: Diagnosis not present

## 2021-10-26 DIAGNOSIS — K219 Gastro-esophageal reflux disease without esophagitis: Secondary | ICD-10-CM | POA: Diagnosis not present

## 2021-10-27 DIAGNOSIS — I371 Nonrheumatic pulmonary valve insufficiency: Secondary | ICD-10-CM | POA: Diagnosis not present

## 2021-10-27 DIAGNOSIS — N178 Other acute kidney failure: Secondary | ICD-10-CM | POA: Diagnosis not present

## 2021-10-27 DIAGNOSIS — E041 Nontoxic single thyroid nodule: Secondary | ICD-10-CM | POA: Diagnosis not present

## 2021-10-27 DIAGNOSIS — R54 Age-related physical debility: Secondary | ICD-10-CM | POA: Diagnosis not present

## 2021-10-27 DIAGNOSIS — I10 Essential (primary) hypertension: Secondary | ICD-10-CM | POA: Diagnosis not present

## 2021-10-27 DIAGNOSIS — J9811 Atelectasis: Secondary | ICD-10-CM | POA: Diagnosis not present

## 2021-10-30 DIAGNOSIS — K219 Gastro-esophageal reflux disease without esophagitis: Secondary | ICD-10-CM | POA: Diagnosis not present

## 2021-10-30 DIAGNOSIS — M800AXD Age-related osteoporosis with current pathological fracture, other site, subsequent encounter for fracture with routine healing: Secondary | ICD-10-CM | POA: Diagnosis not present

## 2021-10-30 DIAGNOSIS — I129 Hypertensive chronic kidney disease with stage 1 through stage 4 chronic kidney disease, or unspecified chronic kidney disease: Secondary | ICD-10-CM | POA: Diagnosis not present

## 2021-10-30 DIAGNOSIS — U071 COVID-19: Secondary | ICD-10-CM | POA: Diagnosis not present

## 2021-10-30 DIAGNOSIS — N184 Chronic kidney disease, stage 4 (severe): Secondary | ICD-10-CM | POA: Diagnosis not present

## 2021-10-30 DIAGNOSIS — I482 Chronic atrial fibrillation, unspecified: Secondary | ICD-10-CM | POA: Diagnosis not present

## 2021-10-31 DIAGNOSIS — I129 Hypertensive chronic kidney disease with stage 1 through stage 4 chronic kidney disease, or unspecified chronic kidney disease: Secondary | ICD-10-CM | POA: Diagnosis not present

## 2021-10-31 DIAGNOSIS — K219 Gastro-esophageal reflux disease without esophagitis: Secondary | ICD-10-CM | POA: Diagnosis not present

## 2021-10-31 DIAGNOSIS — M800AXD Age-related osteoporosis with current pathological fracture, other site, subsequent encounter for fracture with routine healing: Secondary | ICD-10-CM | POA: Diagnosis not present

## 2021-10-31 DIAGNOSIS — U071 COVID-19: Secondary | ICD-10-CM | POA: Diagnosis not present

## 2021-10-31 DIAGNOSIS — I482 Chronic atrial fibrillation, unspecified: Secondary | ICD-10-CM | POA: Diagnosis not present

## 2021-10-31 DIAGNOSIS — N184 Chronic kidney disease, stage 4 (severe): Secondary | ICD-10-CM | POA: Diagnosis not present

## 2021-11-02 ENCOUNTER — Non-Acute Institutional Stay: Payer: Medicare Other

## 2021-11-02 DIAGNOSIS — M800AXD Age-related osteoporosis with current pathological fracture, other site, subsequent encounter for fracture with routine healing: Secondary | ICD-10-CM | POA: Diagnosis not present

## 2021-11-02 DIAGNOSIS — K219 Gastro-esophageal reflux disease without esophagitis: Secondary | ICD-10-CM | POA: Diagnosis not present

## 2021-11-02 DIAGNOSIS — N184 Chronic kidney disease, stage 4 (severe): Secondary | ICD-10-CM | POA: Diagnosis not present

## 2021-11-02 DIAGNOSIS — U071 COVID-19: Secondary | ICD-10-CM | POA: Diagnosis not present

## 2021-11-02 DIAGNOSIS — Z789 Other specified health status: Secondary | ICD-10-CM

## 2021-11-02 DIAGNOSIS — Z299 Encounter for prophylactic measures, unspecified: Secondary | ICD-10-CM | POA: Diagnosis not present

## 2021-11-02 DIAGNOSIS — I129 Hypertensive chronic kidney disease with stage 1 through stage 4 chronic kidney disease, or unspecified chronic kidney disease: Secondary | ICD-10-CM | POA: Diagnosis not present

## 2021-11-02 DIAGNOSIS — G309 Alzheimer's disease, unspecified: Secondary | ICD-10-CM | POA: Diagnosis not present

## 2021-11-02 DIAGNOSIS — M25571 Pain in right ankle and joints of right foot: Secondary | ICD-10-CM | POA: Diagnosis not present

## 2021-11-02 DIAGNOSIS — E440 Moderate protein-calorie malnutrition: Secondary | ICD-10-CM | POA: Diagnosis not present

## 2021-11-02 DIAGNOSIS — I10 Essential (primary) hypertension: Secondary | ICD-10-CM | POA: Diagnosis not present

## 2021-11-02 DIAGNOSIS — I482 Chronic atrial fibrillation, unspecified: Secondary | ICD-10-CM | POA: Diagnosis not present

## 2021-11-02 NOTE — Progress Notes (Signed)
Palliative care SW outreached patient/family via telephone.   PC SW outreached patient/family per [facility] NP - K. Highfill, SW referral request, to assess and address patients emotional and spiritual needs.  SW spoke with patients POA, Legrand Como, whom shared that he has discussed additional mental health support with patient and she is open to this. POA states that patient started on Celexa nearly 3 weeks ago and is seeing some improvement in mood stability but does feel additional counseling support will be beneficial.    SW outreached [facility] - where patient resides - to discuss patients needs and assess the services/assistance they can provide.  Intervention/resources: Health and wellness coordinator, Martie Lee, at [facility] requested a referral/order be sent to them in order for them to set patient up with an outside counseling provider, as they do not have in house psych services. SW made NP aware. Program Mudlogger, Anderson Malta, at [facility] shared that they do offer bible study a few times a week and have Lobbyist that visit and can do one on one chaplain visits with patient if she's open to it.  Palliative care will continue to monitor and assist with long term care planning as needed.

## 2021-11-06 DIAGNOSIS — K219 Gastro-esophageal reflux disease without esophagitis: Secondary | ICD-10-CM | POA: Diagnosis not present

## 2021-11-06 DIAGNOSIS — M800AXD Age-related osteoporosis with current pathological fracture, other site, subsequent encounter for fracture with routine healing: Secondary | ICD-10-CM | POA: Diagnosis not present

## 2021-11-06 DIAGNOSIS — U071 COVID-19: Secondary | ICD-10-CM | POA: Diagnosis not present

## 2021-11-06 DIAGNOSIS — I482 Chronic atrial fibrillation, unspecified: Secondary | ICD-10-CM | POA: Diagnosis not present

## 2021-11-06 DIAGNOSIS — I129 Hypertensive chronic kidney disease with stage 1 through stage 4 chronic kidney disease, or unspecified chronic kidney disease: Secondary | ICD-10-CM | POA: Diagnosis not present

## 2021-11-06 DIAGNOSIS — N184 Chronic kidney disease, stage 4 (severe): Secondary | ICD-10-CM | POA: Diagnosis not present

## 2021-11-14 DIAGNOSIS — U071 COVID-19: Secondary | ICD-10-CM | POA: Diagnosis not present

## 2021-11-14 DIAGNOSIS — M800AXD Age-related osteoporosis with current pathological fracture, other site, subsequent encounter for fracture with routine healing: Secondary | ICD-10-CM | POA: Diagnosis not present

## 2021-11-14 DIAGNOSIS — K219 Gastro-esophageal reflux disease without esophagitis: Secondary | ICD-10-CM | POA: Diagnosis not present

## 2021-11-14 DIAGNOSIS — N184 Chronic kidney disease, stage 4 (severe): Secondary | ICD-10-CM | POA: Diagnosis not present

## 2021-11-14 DIAGNOSIS — I482 Chronic atrial fibrillation, unspecified: Secondary | ICD-10-CM | POA: Diagnosis not present

## 2021-11-14 DIAGNOSIS — I129 Hypertensive chronic kidney disease with stage 1 through stage 4 chronic kidney disease, or unspecified chronic kidney disease: Secondary | ICD-10-CM | POA: Diagnosis not present

## 2021-11-15 DIAGNOSIS — N184 Chronic kidney disease, stage 4 (severe): Secondary | ICD-10-CM | POA: Diagnosis not present

## 2021-11-15 DIAGNOSIS — S3282XK Multiple fractures of pelvis without disruption of pelvic ring, subsequent encounter for fracture with nonunion: Secondary | ICD-10-CM | POA: Diagnosis not present

## 2021-11-15 DIAGNOSIS — S32110D Nondisplaced Zone I fracture of sacrum, subsequent encounter for fracture with routine healing: Secondary | ICD-10-CM | POA: Diagnosis not present

## 2021-11-20 DIAGNOSIS — K219 Gastro-esophageal reflux disease without esophagitis: Secondary | ICD-10-CM | POA: Diagnosis not present

## 2021-11-20 DIAGNOSIS — I129 Hypertensive chronic kidney disease with stage 1 through stage 4 chronic kidney disease, or unspecified chronic kidney disease: Secondary | ICD-10-CM | POA: Diagnosis not present

## 2021-11-20 DIAGNOSIS — U071 COVID-19: Secondary | ICD-10-CM | POA: Diagnosis not present

## 2021-11-20 DIAGNOSIS — N184 Chronic kidney disease, stage 4 (severe): Secondary | ICD-10-CM | POA: Diagnosis not present

## 2021-11-20 DIAGNOSIS — M800AXD Age-related osteoporosis with current pathological fracture, other site, subsequent encounter for fracture with routine healing: Secondary | ICD-10-CM | POA: Diagnosis not present

## 2021-11-20 DIAGNOSIS — I482 Chronic atrial fibrillation, unspecified: Secondary | ICD-10-CM | POA: Diagnosis not present

## 2021-11-22 DIAGNOSIS — I129 Hypertensive chronic kidney disease with stage 1 through stage 4 chronic kidney disease, or unspecified chronic kidney disease: Secondary | ICD-10-CM | POA: Diagnosis not present

## 2021-11-22 DIAGNOSIS — U071 COVID-19: Secondary | ICD-10-CM | POA: Diagnosis not present

## 2021-11-22 DIAGNOSIS — M800AXD Age-related osteoporosis with current pathological fracture, other site, subsequent encounter for fracture with routine healing: Secondary | ICD-10-CM | POA: Diagnosis not present

## 2021-11-22 DIAGNOSIS — I482 Chronic atrial fibrillation, unspecified: Secondary | ICD-10-CM | POA: Diagnosis not present

## 2021-11-22 DIAGNOSIS — N184 Chronic kidney disease, stage 4 (severe): Secondary | ICD-10-CM | POA: Diagnosis not present

## 2021-11-22 DIAGNOSIS — K219 Gastro-esophageal reflux disease without esophagitis: Secondary | ICD-10-CM | POA: Diagnosis not present

## 2021-11-23 DIAGNOSIS — D692 Other nonthrombocytopenic purpura: Secondary | ICD-10-CM | POA: Diagnosis not present

## 2021-11-23 DIAGNOSIS — J449 Chronic obstructive pulmonary disease, unspecified: Secondary | ICD-10-CM | POA: Diagnosis not present

## 2021-11-23 DIAGNOSIS — I10 Essential (primary) hypertension: Secondary | ICD-10-CM | POA: Diagnosis not present

## 2021-11-23 DIAGNOSIS — Z299 Encounter for prophylactic measures, unspecified: Secondary | ICD-10-CM | POA: Diagnosis not present

## 2021-11-23 DIAGNOSIS — M7500 Adhesive capsulitis of unspecified shoulder: Secondary | ICD-10-CM | POA: Diagnosis not present

## 2021-11-28 DIAGNOSIS — M800AXD Age-related osteoporosis with current pathological fracture, other site, subsequent encounter for fracture with routine healing: Secondary | ICD-10-CM | POA: Diagnosis not present

## 2021-11-28 DIAGNOSIS — U071 COVID-19: Secondary | ICD-10-CM | POA: Diagnosis not present

## 2021-11-28 DIAGNOSIS — I482 Chronic atrial fibrillation, unspecified: Secondary | ICD-10-CM | POA: Diagnosis not present

## 2021-11-28 DIAGNOSIS — K219 Gastro-esophageal reflux disease without esophagitis: Secondary | ICD-10-CM | POA: Diagnosis not present

## 2021-11-28 DIAGNOSIS — I129 Hypertensive chronic kidney disease with stage 1 through stage 4 chronic kidney disease, or unspecified chronic kidney disease: Secondary | ICD-10-CM | POA: Diagnosis not present

## 2021-11-28 DIAGNOSIS — N184 Chronic kidney disease, stage 4 (severe): Secondary | ICD-10-CM | POA: Diagnosis not present

## 2021-11-30 DIAGNOSIS — I129 Hypertensive chronic kidney disease with stage 1 through stage 4 chronic kidney disease, or unspecified chronic kidney disease: Secondary | ICD-10-CM | POA: Diagnosis not present

## 2021-11-30 DIAGNOSIS — N184 Chronic kidney disease, stage 4 (severe): Secondary | ICD-10-CM | POA: Diagnosis not present

## 2021-11-30 DIAGNOSIS — K219 Gastro-esophageal reflux disease without esophagitis: Secondary | ICD-10-CM | POA: Diagnosis not present

## 2021-11-30 DIAGNOSIS — I482 Chronic atrial fibrillation, unspecified: Secondary | ICD-10-CM | POA: Diagnosis not present

## 2021-11-30 DIAGNOSIS — M800AXD Age-related osteoporosis with current pathological fracture, other site, subsequent encounter for fracture with routine healing: Secondary | ICD-10-CM | POA: Diagnosis not present

## 2021-11-30 DIAGNOSIS — U071 COVID-19: Secondary | ICD-10-CM | POA: Diagnosis not present

## 2021-12-04 DIAGNOSIS — K219 Gastro-esophageal reflux disease without esophagitis: Secondary | ICD-10-CM | POA: Diagnosis not present

## 2021-12-04 DIAGNOSIS — U071 COVID-19: Secondary | ICD-10-CM | POA: Diagnosis not present

## 2021-12-04 DIAGNOSIS — I129 Hypertensive chronic kidney disease with stage 1 through stage 4 chronic kidney disease, or unspecified chronic kidney disease: Secondary | ICD-10-CM | POA: Diagnosis not present

## 2021-12-04 DIAGNOSIS — I482 Chronic atrial fibrillation, unspecified: Secondary | ICD-10-CM | POA: Diagnosis not present

## 2021-12-04 DIAGNOSIS — M800AXD Age-related osteoporosis with current pathological fracture, other site, subsequent encounter for fracture with routine healing: Secondary | ICD-10-CM | POA: Diagnosis not present

## 2021-12-04 DIAGNOSIS — N184 Chronic kidney disease, stage 4 (severe): Secondary | ICD-10-CM | POA: Diagnosis not present

## 2021-12-07 DIAGNOSIS — K219 Gastro-esophageal reflux disease without esophagitis: Secondary | ICD-10-CM | POA: Diagnosis not present

## 2021-12-07 DIAGNOSIS — N184 Chronic kidney disease, stage 4 (severe): Secondary | ICD-10-CM | POA: Diagnosis not present

## 2021-12-07 DIAGNOSIS — M800AXD Age-related osteoporosis with current pathological fracture, other site, subsequent encounter for fracture with routine healing: Secondary | ICD-10-CM | POA: Diagnosis not present

## 2021-12-07 DIAGNOSIS — I482 Chronic atrial fibrillation, unspecified: Secondary | ICD-10-CM | POA: Diagnosis not present

## 2021-12-07 DIAGNOSIS — U071 COVID-19: Secondary | ICD-10-CM | POA: Diagnosis not present

## 2021-12-07 DIAGNOSIS — I129 Hypertensive chronic kidney disease with stage 1 through stage 4 chronic kidney disease, or unspecified chronic kidney disease: Secondary | ICD-10-CM | POA: Diagnosis not present

## 2021-12-12 DIAGNOSIS — K219 Gastro-esophageal reflux disease without esophagitis: Secondary | ICD-10-CM | POA: Diagnosis not present

## 2021-12-12 DIAGNOSIS — I129 Hypertensive chronic kidney disease with stage 1 through stage 4 chronic kidney disease, or unspecified chronic kidney disease: Secondary | ICD-10-CM | POA: Diagnosis not present

## 2021-12-12 DIAGNOSIS — I482 Chronic atrial fibrillation, unspecified: Secondary | ICD-10-CM | POA: Diagnosis not present

## 2021-12-12 DIAGNOSIS — M800AXD Age-related osteoporosis with current pathological fracture, other site, subsequent encounter for fracture with routine healing: Secondary | ICD-10-CM | POA: Diagnosis not present

## 2021-12-12 DIAGNOSIS — U071 COVID-19: Secondary | ICD-10-CM | POA: Diagnosis not present

## 2021-12-12 DIAGNOSIS — N184 Chronic kidney disease, stage 4 (severe): Secondary | ICD-10-CM | POA: Diagnosis not present

## 2021-12-14 DIAGNOSIS — M545 Low back pain, unspecified: Secondary | ICD-10-CM | POA: Diagnosis not present

## 2021-12-14 DIAGNOSIS — M7500 Adhesive capsulitis of unspecified shoulder: Secondary | ICD-10-CM | POA: Diagnosis not present

## 2021-12-14 DIAGNOSIS — Z299 Encounter for prophylactic measures, unspecified: Secondary | ICD-10-CM | POA: Diagnosis not present

## 2021-12-14 DIAGNOSIS — I10 Essential (primary) hypertension: Secondary | ICD-10-CM | POA: Diagnosis not present

## 2021-12-16 DIAGNOSIS — S3282XK Multiple fractures of pelvis without disruption of pelvic ring, subsequent encounter for fracture with nonunion: Secondary | ICD-10-CM | POA: Diagnosis not present

## 2021-12-16 DIAGNOSIS — N184 Chronic kidney disease, stage 4 (severe): Secondary | ICD-10-CM | POA: Diagnosis not present

## 2021-12-16 DIAGNOSIS — S32110D Nondisplaced Zone I fracture of sacrum, subsequent encounter for fracture with routine healing: Secondary | ICD-10-CM | POA: Diagnosis not present

## 2021-12-18 DIAGNOSIS — I129 Hypertensive chronic kidney disease with stage 1 through stage 4 chronic kidney disease, or unspecified chronic kidney disease: Secondary | ICD-10-CM | POA: Diagnosis not present

## 2021-12-18 DIAGNOSIS — U071 COVID-19: Secondary | ICD-10-CM | POA: Diagnosis not present

## 2021-12-18 DIAGNOSIS — N184 Chronic kidney disease, stage 4 (severe): Secondary | ICD-10-CM | POA: Diagnosis not present

## 2021-12-18 DIAGNOSIS — M800AXD Age-related osteoporosis with current pathological fracture, other site, subsequent encounter for fracture with routine healing: Secondary | ICD-10-CM | POA: Diagnosis not present

## 2021-12-18 DIAGNOSIS — K219 Gastro-esophageal reflux disease without esophagitis: Secondary | ICD-10-CM | POA: Diagnosis not present

## 2021-12-18 DIAGNOSIS — I482 Chronic atrial fibrillation, unspecified: Secondary | ICD-10-CM | POA: Diagnosis not present

## 2021-12-28 DIAGNOSIS — K219 Gastro-esophageal reflux disease without esophagitis: Secondary | ICD-10-CM | POA: Diagnosis not present

## 2021-12-28 DIAGNOSIS — N189 Chronic kidney disease, unspecified: Secondary | ICD-10-CM | POA: Diagnosis not present

## 2021-12-28 DIAGNOSIS — R531 Weakness: Secondary | ICD-10-CM | POA: Diagnosis not present

## 2021-12-28 DIAGNOSIS — I10 Essential (primary) hypertension: Secondary | ICD-10-CM | POA: Diagnosis not present

## 2021-12-28 DIAGNOSIS — I4891 Unspecified atrial fibrillation: Secondary | ICD-10-CM | POA: Diagnosis not present

## 2021-12-28 DIAGNOSIS — Z66 Do not resuscitate: Secondary | ICD-10-CM | POA: Diagnosis not present

## 2022-01-15 DIAGNOSIS — S32110D Nondisplaced Zone I fracture of sacrum, subsequent encounter for fracture with routine healing: Secondary | ICD-10-CM | POA: Diagnosis not present

## 2022-01-15 DIAGNOSIS — N184 Chronic kidney disease, stage 4 (severe): Secondary | ICD-10-CM | POA: Diagnosis not present

## 2022-01-15 DIAGNOSIS — S3282XK Multiple fractures of pelvis without disruption of pelvic ring, subsequent encounter for fracture with nonunion: Secondary | ICD-10-CM | POA: Diagnosis not present

## 2022-01-16 DIAGNOSIS — B351 Tinea unguium: Secondary | ICD-10-CM | POA: Diagnosis not present

## 2022-01-16 DIAGNOSIS — G63 Polyneuropathy in diseases classified elsewhere: Secondary | ICD-10-CM | POA: Diagnosis not present

## 2022-01-16 DIAGNOSIS — R600 Localized edema: Secondary | ICD-10-CM | POA: Diagnosis not present

## 2022-01-16 DIAGNOSIS — Z993 Dependence on wheelchair: Secondary | ICD-10-CM | POA: Diagnosis not present

## 2022-01-18 ENCOUNTER — Inpatient Hospital Stay: Admit: 2022-01-18 | Payer: MEDICARE

## 2022-01-18 DIAGNOSIS — I10 Essential (primary) hypertension: Secondary | ICD-10-CM | POA: Diagnosis not present

## 2022-02-15 DIAGNOSIS — S3282XK Multiple fractures of pelvis without disruption of pelvic ring, subsequent encounter for fracture with nonunion: Secondary | ICD-10-CM | POA: Diagnosis not present

## 2022-02-15 DIAGNOSIS — S32110D Nondisplaced Zone I fracture of sacrum, subsequent encounter for fracture with routine healing: Secondary | ICD-10-CM | POA: Diagnosis not present

## 2022-02-15 DIAGNOSIS — N184 Chronic kidney disease, stage 4 (severe): Secondary | ICD-10-CM | POA: Diagnosis not present

## 2022-02-16 DIAGNOSIS — M25511 Pain in right shoulder: Secondary | ICD-10-CM | POA: Diagnosis not present

## 2022-02-16 DIAGNOSIS — M25512 Pain in left shoulder: Secondary | ICD-10-CM | POA: Diagnosis not present

## 2022-02-16 DIAGNOSIS — W19XXXA Unspecified fall, initial encounter: Secondary | ICD-10-CM | POA: Diagnosis not present

## 2022-02-16 DIAGNOSIS — M25571 Pain in right ankle and joints of right foot: Secondary | ICD-10-CM | POA: Diagnosis not present

## 2022-02-19 DIAGNOSIS — I4891 Unspecified atrial fibrillation: Secondary | ICD-10-CM | POA: Diagnosis not present

## 2022-02-19 DIAGNOSIS — R531 Weakness: Secondary | ICD-10-CM | POA: Diagnosis not present

## 2022-02-19 DIAGNOSIS — I10 Essential (primary) hypertension: Secondary | ICD-10-CM | POA: Diagnosis not present

## 2022-02-19 DIAGNOSIS — M5137 Other intervertebral disc degeneration, lumbosacral region: Secondary | ICD-10-CM | POA: Diagnosis not present

## 2022-02-19 DIAGNOSIS — N189 Chronic kidney disease, unspecified: Secondary | ICD-10-CM | POA: Diagnosis not present

## 2022-02-19 DIAGNOSIS — M8580 Other specified disorders of bone density and structure, unspecified site: Secondary | ICD-10-CM | POA: Diagnosis not present

## 2022-02-19 DIAGNOSIS — M1990 Unspecified osteoarthritis, unspecified site: Secondary | ICD-10-CM | POA: Diagnosis not present

## 2022-02-19 DIAGNOSIS — F32A Depression, unspecified: Secondary | ICD-10-CM | POA: Diagnosis not present

## 2022-02-19 DIAGNOSIS — Z09 Encounter for follow-up examination after completed treatment for conditions other than malignant neoplasm: Secondary | ICD-10-CM | POA: Diagnosis not present

## 2022-03-02 DIAGNOSIS — N390 Urinary tract infection, site not specified: Secondary | ICD-10-CM | POA: Diagnosis not present

## 2022-03-02 DIAGNOSIS — M1990 Unspecified osteoarthritis, unspecified site: Secondary | ICD-10-CM | POA: Diagnosis not present

## 2022-03-02 DIAGNOSIS — F32A Depression, unspecified: Secondary | ICD-10-CM | POA: Diagnosis not present

## 2022-03-14 DIAGNOSIS — I482 Chronic atrial fibrillation, unspecified: Secondary | ICD-10-CM | POA: Diagnosis not present

## 2022-03-17 DIAGNOSIS — S32110D Nondisplaced Zone I fracture of sacrum, subsequent encounter for fracture with routine healing: Secondary | ICD-10-CM | POA: Diagnosis not present

## 2022-03-17 DIAGNOSIS — N184 Chronic kidney disease, stage 4 (severe): Secondary | ICD-10-CM | POA: Diagnosis not present

## 2022-03-17 DIAGNOSIS — S3282XK Multiple fractures of pelvis without disruption of pelvic ring, subsequent encounter for fracture with nonunion: Secondary | ICD-10-CM | POA: Diagnosis not present

## 2022-04-05 DIAGNOSIS — S329XXD Fracture of unspecified parts of lumbosacral spine and pelvis, subsequent encounter for fracture with routine healing: Secondary | ICD-10-CM | POA: Diagnosis not present

## 2022-04-05 DIAGNOSIS — R296 Repeated falls: Secondary | ICD-10-CM | POA: Diagnosis not present

## 2022-04-05 DIAGNOSIS — K219 Gastro-esophageal reflux disease without esophagitis: Secondary | ICD-10-CM | POA: Diagnosis not present

## 2022-04-05 DIAGNOSIS — N189 Chronic kidney disease, unspecified: Secondary | ICD-10-CM | POA: Diagnosis not present

## 2022-04-05 DIAGNOSIS — I482 Chronic atrial fibrillation, unspecified: Secondary | ICD-10-CM | POA: Diagnosis not present

## 2022-04-05 DIAGNOSIS — I129 Hypertensive chronic kidney disease with stage 1 through stage 4 chronic kidney disease, or unspecified chronic kidney disease: Secondary | ICD-10-CM | POA: Diagnosis not present

## 2022-06-07 ENCOUNTER — Encounter: Payer: Self-pay | Admitting: Radiology

## 2023-08-06 ENCOUNTER — Inpatient Hospital Stay: Admit: 2023-08-06 | Payer: Medicare (Managed Care)

## 2023-08-06 DIAGNOSIS — I10 Essential (primary) hypertension: Secondary | ICD-10-CM

## 2023-08-06 LAB — BASIC METABOLIC PANEL
Anion Gap: 8 mmol/L (ref 2–12)
BUN/Creatinine Ratio: 18 (ref 12–20)
BUN: 26 mg/dL — ABNORMAL HIGH (ref 6–20)
CO2: 29 mmol/L (ref 21–32)
Calcium: 8.5 mg/dL (ref 8.5–10.1)
Chloride: 101 mmol/L (ref 97–108)
Creatinine: 1.48 mg/dL — ABNORMAL HIGH (ref 0.55–1.02)
Est, Glom Filt Rate: 33 mL/min/{1.73_m2} — ABNORMAL LOW (ref >60)
Glucose: 102 mg/dL — ABNORMAL HIGH (ref 65–100)
Potassium: 4.3 mmol/L (ref 3.5–5.1)
Sodium: 138 mmol/L (ref 136–145)

## 2023-12-01 ENCOUNTER — Inpatient Hospital Stay
Admit: 2023-12-01 | Discharge: 2023-12-02 | Disposition: A | Discharge status: Home / Self Care | Payer: Medicare (Managed Care) | Arrived: AM | Attending: Emergency Medicine

## 2023-12-01 ENCOUNTER — Emergency Department: Admit: 2023-12-01 | Payer: Medicare (Managed Care)

## 2023-12-01 DIAGNOSIS — U071 COVID-19: Principal | ICD-10-CM

## 2023-12-01 DIAGNOSIS — N390 Urinary tract infection, site not specified: Principal | ICD-10-CM

## 2023-12-01 LAB — CBC WITH AUTO DIFFERENTIAL
Basophils %: 0.8 % (ref 0.0–1.0)
Basophils Absolute: 0.07 K/UL (ref 0.00–0.10)
Eosinophils %: 1.1 % — ABNORMAL HIGH (ref 0.0–0.70)
Eosinophils Absolute: 0.1 K/UL (ref 0.00–0.40)
Hematocrit: 41.9 % (ref 35.0–47.0)
Hemoglobin: 13.9 g/dL (ref 11.5–16.0)
Immature Granulocytes %: 1 % — ABNORMAL HIGH (ref 0.0–0.5)
Immature Granulocytes Absolute: 0.09 K/UL — ABNORMAL HIGH (ref 0.00–0.04)
Lymphocytes %: 19.3 % (ref 12.0–49.0)
Lymphocytes Absolute: 1.76 K/UL (ref 0.80–3.50)
MCH: 31.2 pg (ref 26.0–34.0)
MCHC: 33.2 g/dL (ref 30.0–36.5)
MCV: 94.2 FL (ref 80.0–99.0)
MPV: 9.7 FL (ref 8.9–12.9)
Monocytes %: 6.4 % (ref 5.0–13.0)
Monocytes Absolute: 0.58 K/UL (ref 0.00–1.00)
Neutrophils %: 71.4 % (ref 32.0–75.0)
Neutrophils Absolute: 6.51 K/UL (ref 1.80–8.00)
Nucleated RBCs: 0 /100{WBCs}
Platelets: 335 K/uL (ref 150–400)
RBC: 4.45 M/uL (ref 3.80–5.20)
RDW: 15.3 % — ABNORMAL HIGH (ref 11.5–14.5)
WBC: 9.1 K/uL (ref 3.6–11.0)
nRBC: 0 K/uL (ref 0.00–0.01)

## 2023-12-01 MED ORDER — SODIUM CHLORIDE 0.9 % IV BOLUS
0.9 | Freq: Once | INTRAVENOUS | Status: AC
Start: 2023-12-01 — End: 2023-12-01
  Administered 2023-12-01: 500 mL via INTRAVENOUS

## 2023-12-01 MED FILL — SODIUM CHLORIDE 0.9 % IV SOLN: 0.9 % | INTRAVENOUS | Qty: 500

## 2023-12-01 NOTE — ED Notes (Signed)
Requested and given warm blanket

## 2023-12-01 NOTE — ED Notes (Signed)
 Ozell, patient's next of kin, called and left phone numbers to contact him at. Placed in patient's record.

## 2023-12-01 NOTE — ED Triage Notes (Signed)
 Arrived  via stretcher with ems . Per ems patient had a syncopal episode while eating dinner at facility .

## 2023-12-01 NOTE — ED Notes (Signed)
 On arrival from facility pt was wearing double brief and both were saturated in urine including her pants . Soiled items were removed and skin cleaned . Peri are red

## 2023-12-01 NOTE — ED Provider Notes (Signed)
 [facility] GENERAL EMERGENCY DEPARTMENT  EMERGENCY DEPARTMENT ENCOUNTER       Pt Name: Carol Robbins  MRN: [id number]  Birthdate 1928-07-11  Date of evaluation: 12/01/2023  Provider: Ladislav Caselli O'Bier, MD   PCP: No primary care provider on file.  Note Started: 3:51 AM EDT 12/02/23     CHIEF COMPLAINT       Chief Complaint   Patient presents with    Loss of Consciousness        HISTORY OF PRESENT ILLNESS: 1 or more elements      History From: Patient, EMS, patient's son  HPI Limitations: Dementia     Ronnae Kaser is a 89 y.o. female with history of A-fib, GERD, high blood pressure, dementia, current COVID infection who presents to the ED from [facility] assisted living with chief complaint of syncopal episode at dinnertime.  Patient reports she has been feeling bad.  EKG EMS states that patient had nausea this evening per the nursing home, but patient denies nausea, vomiting here in the ED.  She denies any abdominal pain, chest pain.  Denies any headache, dysuria, hematuria, urinary frequency.  There was no report of fevers.     REVIEW OF SYSTEMS      Review of Systems     Positives and Pertinent negatives as per HPI.    PAST HISTORY     Past Medical History:  Past Medical History:   Diagnosis Date    A-fib [facility])     Cataract          Past Surgical History:  No past surgical history on file.    Family History:  No family history on file.    Social History:  Social History     Tobacco Use    Smoking status: Never    Smokeless tobacco: Never   Substance Use Topics    Alcohol use: Never    Drug use: Never       Allergies:  No Known Allergies    CURRENT MEDICATIONS      Discharge Medication List as of 12/02/2023  4:51 AM        CONTINUE these medications which have NOT CHANGED    Details   amiodarone (CORDARONE) 200 MG tablet Take 1 tablet by mouth dailyHistorical Med      amLODIPine (NORVASC) 10 MG tablet Take 1 tablet by mouth dailyHistorical Med      pantoprazole (PROTONIX) 40 MG tablet Take 1 tablet by mouth  dailyHistorical Med      potassium chloride (KLOR-CON) 10 MEQ extended release tablet Take 1 tablet by mouth onceHistorical Med             SCREENINGS               No data recorded        PHYSICAL EXAM      ED Triage Vitals   Encounter Vitals Group      BP 12/01/23 1930 (!) 112/56      Girls Systolic BP Percentile --       Girls Diastolic BP Percentile --       Boys Systolic BP Percentile --       Boys Diastolic BP Percentile --       Pulse 12/01/23 1943 64      Respirations 12/01/23 1943 20      Temp 12/01/23 1915 98.2 F (36.8 C)      Temp Source 12/01/23 1915 Oral      SpO2 12/01/23 1943 92 %  Weight - Scale 12/01/23 1915 71.2 kg (156 lb 14.4 oz)      Height --       Head Circumference --       Peak Flow --       Pain Score --       Pain Loc --       Pain Education --       Exclude from Growth Chart --               Physical Exam  Vitals and nursing note reviewed.   Constitutional:       General: She is not in acute distress.     Appearance: She is normal weight. She is not ill-appearing or diaphoretic.   HENT:      Head: Normocephalic and atraumatic.      Nose: Nose normal. No rhinorrhea.   Cardiovascular:      Rate and Rhythm: Normal rate and regular rhythm.      Heart sounds: Normal heart sounds. No murmur heard.  Pulmonary:      Effort: Pulmonary effort is normal. No respiratory distress.      Breath sounds: No wheezing or rales.   Chest:      Chest wall: No tenderness.   Abdominal:      General: There is no distension.      Palpations: Abdomen is soft. There is no mass.      Tenderness: There is no abdominal tenderness. There is no right CVA tenderness, left CVA tenderness, guarding or rebound.   Musculoskeletal:      Cervical back: Normal range of motion. No tenderness.      Right lower leg: No edema.      Left lower leg: No edema.   Skin:     General: Skin is warm.      Coloration: Skin is not jaundiced.      Findings: No erythema or rash.   Neurological:      General: No focal deficit present.       Mental Status: She is alert.      Comments: Oriented to person and place but not time   Psychiatric:         Mood and Affect: Mood normal.            DIAGNOSTIC RESULTS   LABS:     Recent Results (from the past 24 hours)   EKG 12 Lead    Collection Time: 12/01/23  7:19 PM   Result Value Ref Range    Ventricular Rate 67 BPM    Atrial Rate 72 BPM    QRS Duration 120 ms    Q-T Interval 500 ms    QTc Calculation (Bazett) 528 ms    R Axis 35 degrees    T Axis 4 degrees    Diagnosis       Wide QRS rhythm  Right bundle branch block  Abnormal ECG  When compared with ECG of 31-Mar-2020 12:03,  Wide QRS rhythm has replaced Sinus rhythm     CBC with Auto Differential    Collection Time: 12/01/23  7:40 PM   Result Value Ref Range    WBC 9.1 3.6 - 11.0 K/uL    RBC 4.45 3.80 - 5.20 M/uL    Hemoglobin 13.9 11.5 - 16.0 g/dL    Hematocrit 58.0 64.9 - 47.0 %    MCV 94.2 80.0 - 99.0 FL    MCH 31.2 26.0 - 34.0 PG  MCHC 33.2 30.0 - 36.5 g/dL    RDW 84.6 (H) 88.4 - 14.5 %    Platelets 335 150 - 400 K/uL    MPV 9.7 8.9 - 12.9 FL    Nucleated RBCs 0.0 0 PER 100 WBC    nRBC 0.00 0.00 - 0.01 K/uL    Neutrophils % 71.4 32.0 - 75.0 %    Lymphocytes % 19.3 12.0 - 49.0 %    Monocytes % 6.4 5.0 - 13.0 %    Eosinophils % 1.1 (H) 0.0 - 0.70 %    Basophils % 0.8 0.0 - 1.0 %    Immature Granulocytes % 1.0 (H) 0.0 - 0.5 %    Neutrophils Absolute 6.51 1.80 - 8.00 K/UL    Lymphocytes Absolute 1.76 0.80 - 3.50 K/UL    Monocytes Absolute 0.58 0.00 - 1.00 K/UL    Eosinophils Absolute 0.10 0.00 - 0.40 K/UL    Basophils Absolute 0.07 0.00 - 0.10 K/UL    Immature Granulocytes Absolute 0.09 (H) 0.00 - 0.04 K/UL    Differential Type AUTOMATED     Comprehensive Metabolic Panel    Collection Time: 12/01/23  7:40 PM   Result Value Ref Range    Sodium 138 136 - 145 mmol/L    Potassium 4.1 3.5 - 5.1 mmol/L    Chloride 100 97 - 108 mmol/L    CO2 27 21 - 32 mmol/L    Anion Gap 11 2 - 12 mmol/L    Glucose 139 (H) 65 - 100 mg/dL    BUN 21 (H) 6 - 20 MG/DL     Creatinine 8.24 (H) 0.55 - 1.02 MG/DL    BUN/Creatinine Ratio 12 12 - 20      Est, Glom Filt Rate 27 (L) >60 ml/min/1.74m2    Calcium 8.9 8.5 - 10.1 MG/DL    Total Bilirubin 0.9 0.2 - 1.0 MG/DL    ALT 42 12 - 78 U/L    AST 44 (H) 15 - 37 U/L    Alk Phosphatase 112 45 - 117 U/L    Total Protein 7.5 6.4 - 8.2 g/dL    Albumin 3.1 (L) 3.5 - 5.0 g/dL    Globulin 4.4 (H) 2.0 - 4.0 g/dL    Albumin/Globulin Ratio 0.7 (L) 1.1 - 2.2     Magnesium    Collection Time: 12/01/23  7:40 PM   Result Value Ref Range    Magnesium 2.1 1.6 - 2.4 mg/dL   Lipase    Collection Time: 12/01/23  7:40 PM   Result Value Ref Range    Lipase 32 13 - 75 U/L   Lactic Acid    Collection Time: 12/01/23  7:40 PM   Result Value Ref Range    Lactic Acid, Plasma 3.1 (HH) 0.4 - 2.0 MMOL/L   Troponin    Collection Time: 12/01/23  7:40 PM   Result Value Ref Range    Troponin, High Sensitivity 15 0 - 51 ng/L   COVID-19 & Influenza Combo    Collection Time: 12/01/23  8:00 PM    Specimen: Nasopharyngeal   Result Value Ref Range    Source Nasopharyngeal      SARS-CoV-2, PCR Detected (A) NOTD      Rapid Influenza A By PCR Not detected NOTD      Rapid Influenza B By PCR Not detected NOTD     Lactic Acid    Collection Time: 12/01/23 11:05 PM   Result Value Ref Range    Lactic  Acid, Plasma 1.8 0.4 - 2.0 MMOL/L   Urinalysis with Reflex to Culture    Collection Time: 12/02/23 12:05 AM    Specimen: Urine   Result Value Ref Range    Color, UA YELLOW/STRAW      Appearance HAZY (A) CLEAR      Specific Gravity, UA 1.010 1.003 - 1.030      pH, Urine 6.0 5.0 - 8.0      Protein, UA Negative NEG mg/dL    Glucose, Ur Negative NEG mg/dL    Ketones, Urine Negative NEG mg/dL    Bilirubin, Urine Negative NEG      Blood, Urine Negative NEG      Urobilinogen, Urine 0.2 0.2 - 1.0 EU/dL    Nitrite, Urine Positive (A) NEG      Leukocyte Esterase, Urine Negative NEG      WBC, UA 0-4 0 - 4 /hpf    RBC, UA 0-5 0 - 5 /hpf    Epithelial Cells, UA FEW FEW /lpf    BACTERIA, URINE 3+ (A) NEG  /hpf    Urine Culture if Indicated CULTURE NOT INDICATED BY UA RESULT CNI      Hyaline Casts, UA 2-5 0 - 5 /lpf                RADIOLOGY:  Non-plain film images such as CT, Ultrasound and MRI are read by the radiologist. Plain radiographic images are visualized and preliminarily interpreted by the ED Provider with the below findings:     Portable chest x-ray interpreted by me: Possible left lower lobe infiltrate     Interpretation per the Radiologist below, if available at the time of this note:     XR CHEST PORTABLE   Final Result   Left basilar airspace disease possibly atelectatic or infectious.         Electronically signed by Arlyn Seen               SCREENINGS   NIH Stroke Score       Heart Score       Curb-65          EMERGENCY DEPARTMENT COURSE and DIFFERENTIAL DIAGNOSIS/MDM   Vitals:    Vitals:    12/01/23 2331 12/01/23 2356 12/02/23 0200 12/02/23 0249   BP: (!) 155/57 (!) 161/56 (!) 137/96 (!) 138/53   Pulse: 60 69 64    Resp: 11 16 14     Temp:       TempSrc:       SpO2:   91% 96%   Weight:                Patient was given the following medications:  Medications   sodium chloride  0.9 % bolus 500 mL (0 mLs IntraVENous Stopped 12/01/23 2028)   sodium chloride  0.9 % bolus 500 mL (0 mLs IntraVENous Stopped 12/01/23 2303)   cefTRIAXone  (ROCEPHIN ) 1,000 mg in sterile water  10 mL IV syringe (1,000 mg IntraVENous Given 12/02/23 0202)           Chronic Conditions:   Past Medical History:   Diagnosis Date    A-fib [facility])     Cataract        Social Determinants affecting Dx or Tx: Lives in nursing home    Records Reviewed (source and summary of external notes): Old Medical Records, Nursing Notes, Prior ED/Hospital Visits    CC/HPI Summary, DDx, ED Course, and Reassessment: 89 year old female presents to the ED with nausea and syncopal  episode at nursing home.  She was diagnosed with COVID about 2 weeks ago.  She may have had decreased p.o. intake for the last several days per family.    On exam, she is afebrile.   Respiratory rate, heart rate, sats are normal.  Blood pressure moderately elevated.  She is awake and alert and oriented to person and place but confused about situation and time.  Lungs are clear.  Regular rate and rhythm.  Abdomen is soft and nontender.    Differential diagnosis includes dehydration, electrolyte abnormality, COVID, flu, UTI, pneumonia.    Will check labs and imaging including chest x-ray, CBC, CMP, troponin, UA.Lab and imaging reviewed.  Chest x-ray showed possible infiltrate left lower lobe which may be infectious versus atelectasis.  White count and hemoglobin are normal.  Chemistries show creatinine of 1.75 which isMildly elevated compared to baseline.  Lactate is elevated initially at 3.1 but came down to 1.8 after IV fluids.  UA shows UTI.  Will send for culture.  Patient treated with Rocephin  in the ED.  ED Course as of 12/02/23 9476   [place] Dec 01, 2023   2232 EKG at 7:19 PM on 12/01/2023 interpreted by me:  sinus rhythm, first-degree AV block, 67 bpm, right bundle branch block, normal axis, prolonged QTc interval [AO]      ED Course User Index  [AO] O'Bier, Frederika Hukill N, MD         FINAL IMPRESSION     1. Urinary tract infection without hematuria, site unspecified    2. COVID-19    3. Pneumonia of left lower lobe due to infectious organism          DISPOSITION/PLAN   DISPOSITION Decision To Discharge 12/02/2023 12:52:16 AM   DISPOSITION CONDITION Stable            PATIENT REFERRED TO:  [facility]  [address]  [place]  [address]  [phone]    If symptoms worsen         DISCHARGE MEDICATIONS:     Medication List        START taking these medications      azithromycin  250 MG tablet  Commonly known as: Zithromax  Z-Pak  Take 2 tablets (500 mg) on Day 1, and then take 1 tablet (250 mg) on days 2 through 5.     cefdinir  300 MG capsule  Commonly known as: OMNICEF   Take 1 capsule by mouth 2 times daily for 7 days            ASK your doctor about these medications       amiodarone 200 MG tablet  Commonly known as: CORDARONE     amLODIPine 10 MG tablet  Commonly known as: NORVASC     pantoprazole 40 MG tablet  Commonly known as: PROTONIX     potassium chloride 10 MEQ extended release tablet  Commonly known as: KLOR-CON               Where to Get Your Medications        Information about where to get these medications is not yet available    Ask your nurse or doctor about these medications  azithromycin  250 MG tablet  cefdinir  300 MG capsule           DISCONTINUED MEDICATIONS:  Discharge Medication List as of 12/02/2023  4:51 AM          I am the Primary Clinician of Record.   Pharell Rolfson O'Bier,  MD (electronically signed)      (Please note that parts of this dictation were completed with voice recognition software. Quite often unanticipated grammatical, syntax, homophones, and other interpretive errors are inadvertently transcribed by the computer software. Please disregards these errors. Please excuse any errors that have escaped final proofreading.)         O'Bier, Merrell Rettinger N, MD  12/02/23 [phone]

## 2023-12-02 LAB — URINALYSIS WITH REFLEX TO CULTURE
Bilirubin, Urine: NEGATIVE
Blood, Urine: NEGATIVE
Glucose, Ur: NEGATIVE mg/dL
Ketones, Urine: NEGATIVE mg/dL
Leukocyte Esterase, Urine: NEGATIVE
Nitrite, Urine: POSITIVE — AB
Protein, UA: NEGATIVE mg/dL
Specific Gravity, UA: 1.01 (ref 1.003–1.030)
Urobilinogen, Urine: 0.2 EU/dL (ref 0.2–1.0)
pH, Urine: 6 (ref 5.0–8.0)

## 2023-12-02 LAB — COMPREHENSIVE METABOLIC PANEL
ALT: 42 U/L (ref 12–78)
AST: 44 U/L — ABNORMAL HIGH (ref 15–37)
Albumin/Globulin Ratio: 0.7 — ABNORMAL LOW (ref 1.1–2.2)
Albumin: 3.1 g/dL — ABNORMAL LOW (ref 3.5–5.0)
Alk Phosphatase: 112 U/L (ref 45–117)
Anion Gap: 11 mmol/L (ref 2–12)
BUN/Creatinine Ratio: 12 (ref 12–20)
BUN: 21 mg/dL — ABNORMAL HIGH (ref 6–20)
CO2: 27 mmol/L (ref 21–32)
Calcium: 8.9 mg/dL (ref 8.5–10.1)
Chloride: 100 mmol/L (ref 97–108)
Creatinine: 1.75 mg/dL — ABNORMAL HIGH (ref 0.55–1.02)
Est, Glom Filt Rate: 27 ml/min/1.73m2 — ABNORMAL LOW (ref >60)
Globulin: 4.4 g/dL — ABNORMAL HIGH (ref 2.0–4.0)
Glucose: 139 mg/dL — ABNORMAL HIGH (ref 65–100)
Potassium: 4.1 mmol/L (ref 3.5–5.1)
Sodium: 138 mmol/L (ref 136–145)
Total Bilirubin: 0.9 mg/dL (ref 0.2–1.0)
Total Protein: 7.5 g/dL (ref 6.4–8.2)

## 2023-12-02 LAB — COVID-19 & INFLUENZA COMBO
Rapid Influenza A By PCR: NOT DETECTED
Rapid Influenza B By PCR: NOT DETECTED
SARS-CoV-2, PCR: DETECTED — AB

## 2023-12-02 LAB — TROPONIN: Troponin, High Sensitivity: 15 ng/L (ref 0–51)

## 2023-12-02 LAB — LACTIC ACID
Lactic Acid, Plasma: 1.8 mmol/L (ref 0.4–2.0)
Lactic Acid, Plasma: 3.1 mmol/L (ref 0.4–2.0)

## 2023-12-02 LAB — MAGNESIUM: Magnesium: 2.1 mg/dL (ref 1.6–2.4)

## 2023-12-02 LAB — LIPASE: Lipase: 32 U/L (ref 13–75)

## 2023-12-02 MED ORDER — STERILE WATER FOR INJECTION (MIXTURES ONLY)
1 | INTRAMUSCULAR | Status: AC
Start: 2023-12-02 — End: 2023-12-02
  Administered 2023-12-02: 06:00:00 1000 mg via INTRAVENOUS

## 2023-12-02 MED ORDER — SODIUM CHLORIDE 0.9 % IV BOLUS
0.9 | Freq: Once | INTRAVENOUS | Status: AC
Start: 2023-12-02 — End: 2023-12-01
  Administered 2023-12-02: 03:00:00 500 mL via INTRAVENOUS

## 2023-12-02 MED ORDER — AZITHROMYCIN 250 MG PO TABS
250 | PACK | ORAL | 0 refills | Status: DC
Start: 2023-12-02 — End: 2023-12-02

## 2023-12-02 MED ORDER — CEFDINIR 300 MG PO CAPS
300 | ORAL_CAPSULE | Freq: Two times a day (BID) | ORAL | 0 refills | 7.00000 days | Status: DC
Start: 2023-12-02 — End: 2023-12-05

## 2023-12-02 MED ORDER — CEFDINIR 300 MG PO CAPS
300 | ORAL_CAPSULE | Freq: Two times a day (BID) | ORAL | 0 refills | Status: DC
Start: 2023-12-02 — End: 2023-12-02

## 2023-12-02 MED ORDER — AZITHROMYCIN 250 MG PO TABS
250 | PACK | ORAL | 0 refills | Status: AC
Start: 2023-12-02 — End: 2023-12-06

## 2023-12-02 MED FILL — STERILE WATER FOR INJECTION IJ SOLN: INTRAMUSCULAR | Qty: 10

## 2023-12-02 MED FILL — CEFTRIAXONE SODIUM 1 G IJ SOLR: 1 g | INTRAMUSCULAR | Qty: 1000

## 2023-12-02 MED FILL — SODIUM CHLORIDE 0.9 % IV SOLN: 0.9 % | INTRAVENOUS | Qty: 500

## 2023-12-02 NOTE — ED Notes (Signed)
 Report to [facility]

## 2023-12-02 NOTE — ED Notes (Signed)
 Incontinent of urine and stool, cleaned and pads changed

## 2023-12-02 NOTE — Progress Notes (Signed)
 Contacted [facility] to arrange BLS transport of patient back to [facility]. Discussed patient condition, and need for transport. ETA is 0700. ED is aware.

## 2023-12-03 LAB — EKG 12-LEAD
Atrial Rate: 72 {beats}/min
Diagnosis: NORMAL
Q-T Interval: 500 ms
QRS Duration: 120 ms
QTc Calculation (Bazett): 528 ms
R Axis: 35 degrees
T Axis: 4 degrees
Ventricular Rate: 67 {beats}/min

## 2023-12-04 LAB — CULTURE, URINE: Colony count: >100000

## 2023-12-05 MED ORDER — NITROFURANTOIN MONOHYD MACRO 100 MG PO CAPS
100 | ORAL_CAPSULE | Freq: Two times a day (BID) | ORAL | 0 refills | 10.00000 days | Status: AC
Start: 2023-12-05 — End: 2023-12-10
